# Patient Record
Sex: Female | Born: 1945 | Race: White | Hispanic: No | Marital: Married | Smoking: Never smoker
Health system: Southern US, Community
[De-identification: ages and names within clinical notes are randomized; demographics above are authoritative.]

## PROBLEM LIST (undated history)

## (undated) DIAGNOSIS — L309 Dermatitis, unspecified: Secondary | ICD-10-CM

## (undated) DIAGNOSIS — F419 Anxiety disorder, unspecified: Secondary | ICD-10-CM

## (undated) DIAGNOSIS — F329 Major depressive disorder, single episode, unspecified: Secondary | ICD-10-CM

## (undated) DIAGNOSIS — K635 Polyp of colon: Secondary | ICD-10-CM

## (undated) DIAGNOSIS — K219 Gastro-esophageal reflux disease without esophagitis: Secondary | ICD-10-CM

## (undated) DIAGNOSIS — F32A Depression, unspecified: Secondary | ICD-10-CM

## (undated) DIAGNOSIS — T7840XA Allergy, unspecified, initial encounter: Secondary | ICD-10-CM

## (undated) DIAGNOSIS — L409 Psoriasis, unspecified: Secondary | ICD-10-CM

## (undated) DIAGNOSIS — C801 Malignant (primary) neoplasm, unspecified: Secondary | ICD-10-CM

## (undated) DIAGNOSIS — N200 Calculus of kidney: Secondary | ICD-10-CM

## (undated) DIAGNOSIS — G4730 Sleep apnea, unspecified: Secondary | ICD-10-CM

## (undated) DIAGNOSIS — R251 Tremor, unspecified: Secondary | ICD-10-CM

## (undated) HISTORY — DX: Malignant (primary) neoplasm, unspecified: C80.1

## (undated) HISTORY — DX: Gastro-esophageal reflux disease without esophagitis: K21.9

## (undated) HISTORY — DX: Polyp of colon: K63.5

## (undated) HISTORY — DX: Tremor, unspecified: R25.1

## (undated) HISTORY — DX: Depression, unspecified: F32.A

## (undated) HISTORY — DX: Calculus of kidney: N20.0

## (undated) HISTORY — DX: Anxiety disorder, unspecified: F41.9

## (undated) HISTORY — DX: Psoriasis, unspecified: L40.9

## (undated) HISTORY — DX: Allergy, unspecified, initial encounter: T78.40XA

## (undated) HISTORY — DX: Sleep apnea, unspecified: G47.30

## (undated) HISTORY — DX: Dermatitis, unspecified: L30.9

---
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
## 1998-09-06 HISTORY — PX: CHOLECYSTECTOMY: SHX55

## 2011-09-07 HISTORY — PX: GASTRIC BYPASS: SHX52

## 2012-09-06 HISTORY — PX: TOTAL KNEE ARTHROPLASTY: SHX125

## 2016-07-27 DIAGNOSIS — M75120 Complete rotator cuff tear or rupture of unspecified shoulder, not specified as traumatic: Secondary | ICD-10-CM | POA: Insufficient documentation

## 2019-04-24 ENCOUNTER — Telehealth: Payer: Self-pay | Admitting: Family Medicine

## 2019-04-24 ENCOUNTER — Ambulatory Visit (INDEPENDENT_AMBULATORY_CARE_PROVIDER_SITE_OTHER): Payer: Medicare Other | Admitting: Family Medicine

## 2019-04-24 ENCOUNTER — Other Ambulatory Visit: Payer: Self-pay | Admitting: Family Medicine

## 2019-04-24 ENCOUNTER — Other Ambulatory Visit: Payer: Self-pay

## 2019-04-24 ENCOUNTER — Encounter: Payer: Self-pay | Admitting: Family Medicine

## 2019-04-24 VITALS — BP 136/78 | HR 99 | Resp 16 | Ht 62.5 in | Wt 272.0 lb

## 2019-04-24 DIAGNOSIS — N200 Calculus of kidney: Secondary | ICD-10-CM | POA: Insufficient documentation

## 2019-04-24 DIAGNOSIS — F411 Generalized anxiety disorder: Secondary | ICD-10-CM | POA: Insufficient documentation

## 2019-04-24 DIAGNOSIS — G4733 Obstructive sleep apnea (adult) (pediatric): Secondary | ICD-10-CM

## 2019-04-24 DIAGNOSIS — Z7689 Persons encountering health services in other specified circumstances: Secondary | ICD-10-CM | POA: Diagnosis not present

## 2019-04-24 DIAGNOSIS — E782 Mixed hyperlipidemia: Secondary | ICD-10-CM | POA: Insufficient documentation

## 2019-04-24 DIAGNOSIS — Z6841 Body Mass Index (BMI) 40.0 and over, adult: Secondary | ICD-10-CM

## 2019-04-24 DIAGNOSIS — C441192 Basal cell carcinoma of skin of left lower eyelid, including canthus: Secondary | ICD-10-CM | POA: Diagnosis not present

## 2019-04-24 DIAGNOSIS — R7309 Other abnormal glucose: Secondary | ICD-10-CM

## 2019-04-24 DIAGNOSIS — R600 Localized edema: Secondary | ICD-10-CM

## 2019-04-24 DIAGNOSIS — Z9989 Dependence on other enabling machines and devices: Secondary | ICD-10-CM

## 2019-04-24 NOTE — Progress Notes (Signed)
Subjective:    Patient ID: Natasha Curry, female    DOB: [birth year], 73 y.o.   MRN: [id number]  Natasha Curry is a 73 y.o. female presenting on 04/24/2019 for [place], Obesity, Nephrolithiasis, and Hyperlipidemia  Moved from [place] about 5 months ago. Her husband, Natasha Curry, is also my patient here recently established.  HPI   Hypersomnia / Family history of Mood Disorder vs Anxiety She is sleeping often, and enjoys resting. She is not depressed, doing well overall with mood. She can take naps 1-2 hours in afternoon. She uses CPAP machine. She is very comfortable here and home more often therefore often will take more naps. - She has history of grief depression in past lost her best friend, she was treated at [facility] in [place], she eventually was taken off multiple psych meds, and transitioned to Ativan, not on this long term. Now she is taking Lexapro 20mg  daily with good results. - Admits some issues with some memory loss and forgetful - She says some family with history of mental health - Currently she is doing well.  Morbid Obesity BMI >48 / S/p gastric bypass She says history lost 100 lbs in past, and gained weight back Admits abnormal weight gain  HYPERLIPIDEMIA: - Reports no concerns. Last lipid panel >6 months ago unsure - Currently taking Atorvastatin 10mg  daily, tolerating well without side effects or myalgias  Peripheral Edema Recently started within past 1 year or less on Furosemide 20mg  daily, she has not had issues with blood pressure and only taking for fluid, she says only mild improvement on pill, asking if she should take it regularly  Recurrent Nephrolithiasis History of recurrent kidney stones, usually at least 1 per year, in past urology required lithotripsy for stone treatment, she request refer to local urologist now. Currently asymptomatic.  BCC Left Lower Eyelid S/p surgical treatment, she has issue with change in eyelid now requesting  return to eye doctor  Health Maintenance:  Colon CA Screening: Last Colonoscopy (done by out of state [place] [facility]), results with polyps, good for 5 years. Currently asymptomatic. No known family history of colon CA Request record now and consider repeat in 4 years   Depression screen PHQ 2/9 04/24/2019  Decreased Interest 1  Down, Depressed, Hopeless 0  PHQ - 2 Score 1  Altered sleeping 3  Tired, decreased energy 3  Change in appetite 3  Feeling bad or failure about yourself  0  Trouble concentrating 0  Moving slowly or fidgety/restless 0  Suicidal thoughts 0  PHQ-9 Score 10    Past Medical History:  Diagnosis Date  . Allergy   . Anxiety   . 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 Colon polyp   . Depression   . Eczema   . GERD (gastroesophageal reflux disease)   . Kidney stones   . Psoriasis   . Sleep apnea    Past Surgical History:  Procedure Laterality Date  . CHOLECYSTECTOMY  2000  . GASTRIC BYPASS  2013  . TOTAL KNEE ARTHROPLASTY  2014   Social History   Socioeconomic History  . Marital status: Married    Spouse name: Not on file  . Number of children: Not on file  . Years of education: Not on file  . Highest education level: Not on file  Occupational History  . Not on file  Social Needs  . Financial resource strain: Not on file  . Food insecurity    Worry: Not on  file    Inability: Not on file  . Transportation needs    Medical: Not on file    Non-medical: Not on file  Tobacco Use  . Smoking status: Never Smoker  . Smokeless tobacco: Never Used  Substance and Sexual Activity  . Alcohol use: Yes    Alcohol/week: 3.0 standard drinks    Types: 3 Standard drinks or equivalent per week    Frequency: Never  . Drug use: Never  . Sexual activity: Not on file  Lifestyle  . Physical activity    Days per week: Not on file    Minutes per session: Not on file  . Stress: Not on file  Relationships  . Social Herbalist on phone: Not on file    Gets  together: Not on file    Attends religious service: Not on file    Active member of club or organization: Not on file    Attends meetings of clubs or organizations: Not on file    Relationship status: Not on file  . Intimate partner violence    Fear of current or ex partner: Not on file    Emotionally abused: Not on file    Physically abused: Not on file    Forced sexual activity: Not on file  Other Topics Concern  . Not on file  Social History Narrative  . Not on file   Family History  Problem Relation Age of Onset  . Cancer Mother        adrenal  . Heart attack Father   . Heart disease Father   . Stroke Father    Current Outpatient Medications on File Prior to Visit  Medication Sig  . atorvastatin (LIPITOR) 10 MG tablet Take 10 mg by mouth daily.  Marland Kitchen escitalopram (LEXAPRO) 20 MG tablet Take 20 mg by mouth daily.  . furosemide (LASIX) 20 MG tablet Take 20 mg by mouth daily as needed.  . metoprolol succinate (TOPROL-XL) 25 MG 24 hr tablet Take 12.5 mg by mouth daily.  Marland Kitchen omeprazole (PRILOSEC) 40 MG capsule Take 40 mg by mouth daily.    No current facility-administered medications on file prior to visit.     Review of Systems Per HPI unless specifically indicated above      Objective:    BP 136/78   Pulse 99   Resp 16   Ht 5' 2.5" (1.588 m)   Wt 272 lb (123.4 kg)   SpO2 99%   BMI 48.96 kg/m   Wt Readings from Last 3 Encounters:  04/24/19 272 lb (123.4 kg)    Physical Exam Vitals signs and nursing note reviewed.  Constitutional:      General: She is not in acute distress.    Appearance: She is well-developed. She is not diaphoretic.     Comments: Well-appearing, comfortable, cooperative, obese  HENT:     Head: Normocephalic and atraumatic.  Eyes:     General:        Right eye: No discharge.        Left eye: No discharge.     Conjunctiva/sclera: Conjunctivae normal.  Cardiovascular:     Rate and Rhythm: Normal rate.  Pulmonary:     Effort: Pulmonary  effort is normal.  Skin:    General: Skin is warm and dry.     Findings: No erythema or rash.  Neurological:     Mental Status: She is alert and oriented to person, place, and time.  Psychiatric:  Behavior: Behavior normal.     Comments: Well groomed, good eye contact, normal speech and thoughts    No results found for this or any previous visit.    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 They can request records from her ophtho out of state    Relevant Orders   Ambulatory referral to Ophthalmology   OSA on CPAP - Primary  Well controlled, chronic OSA on CPAP, now >5 years - Good adherence to CPAP nightly - Continue current CPAP therapy, patient seems to be benefiting from therapy  Still with frequent sleeping in daytime but not having daytime sleepiness interfering with activities  Refer to [facility] by patient request for managing OSA    Relevant Orders   Ambulatory referral to Pulmonology   Recurrent nephrolithiasis Chronic recurrent issue, stable currently without acute flare  Referral for establish care w/ Urologist for future management of stones    Relevant Orders   Ambulatory referral to Urology    Other Visit Diagnoses    Encounter to establish care with new doctor        Request records release from prior PCP and specialist out of state including colonoscopy   #peripheral edema, b/l lower ext No significant problem or worsening Advised can reduce dose on Furosemide and use PRN ONLY for now, to see if still effective Keep on RICE therapy Future labs  No orders of the defined types were placed in this encounter.  Orders Placed This Encounter  Procedures  . Ambulatory referral to Pulmonology    Referral Priority:   Routine    Referral Type:    Consultation    Referral Reason:   Specialty Services Required    Requested Specialty:   Pulmonary Disease    Number of Visits Requested:   1  . Ambulatory referral to Urology    Referral Priority:   Routine    Referral Type:   Consultation    Referral Reason:   Specialty Services Required    Requested Specialty:   Urology    Number of Visits Requested:   1  . Ambulatory referral to Ophthalmology    Referral Priority:   Routine    Referral Type:   Consultation    Referral Reason:   Specialty Services Required    Requested Specialty:   Ophthalmology    Number of Visits Requested:   1      Follow up plan: Return in about 3 months (around 07/25/2019) for 3 months for Yearly Medicare Check up + EKG.  Future labs ordered 07/19/19  Nobie Putnam, [facility] 04/24/2019, 10:30 AM

## 2019-04-24 NOTE — Patient Instructions (Addendum)
Thank you for coming to the office today.  Let our office know by phone when you are running low on medications and we can re order to put my name on the rx  Referrals sent - stay tuned for apt  [facility] Pulmonology [address] [place], [place] Phone: [place] -1st floor [place],  [place]  [address] Phone: [phone]  [facility] [address] [place], [place] [address] Phone: [phone] [email or web address]  EKG in 3 months at yearly apt  DUE for FASTING BLOOD WORK (no food or drink after midnight before the lab appointment, only water or coffee without cream/sugar on the morning of)  SCHEDULE "Lab Only" visit in the morning at the clinic for lab draw in 3 MONTHS   - Make sure Lab Only appointment is at about 1 week before your next appointment, so that results will be available  For Lab Results, once available within 2-3 days of blood draw, you can can log in to MyChart online to view your results and a brief explanation. Also, we can discuss results at next follow-up visit.   Please schedule a Follow-up Appointment to: Return in about 3 months (around 07/25/2019) for 3 months for Yearly Medicare Check up + EKG.  If you have any other questions or concerns, please feel free to call the office or send a message through [place]. You may also schedule an earlier appointment if necessary.  Additionally, you may be receiving a survey about your experience at our office within a few days to 1 week by e-mail or mail. We value your feedback.  Nobie Putnam, DO [place]

## 2019-04-24 NOTE — Telephone Encounter (Signed)
[facility] called said that pt need to go to [facility]

## 2019-04-24 NOTE — Telephone Encounter (Signed)
Natasha Curry,  Would you be able to use the same external referral order and switch this to the patient's new requested location? She called [place] back today and said that [facility] called her and she needed to go to a [facility] provider.  Let me know if you need a new order.  Nobie Putnam, [facility] 04/24/2019, 4:58 PM

## 2019-04-26 ENCOUNTER — Other Ambulatory Visit: Payer: Self-pay | Admitting: Family Medicine

## 2019-04-26 DIAGNOSIS — N898 Other specified noninflammatory disorders of vagina: Secondary | ICD-10-CM

## 2019-04-27 ENCOUNTER — Other Ambulatory Visit: Payer: Self-pay | Admitting: Family Medicine

## 2019-04-27 DIAGNOSIS — F411 Generalized anxiety disorder: Secondary | ICD-10-CM

## 2019-04-27 MED ORDER — ESCITALOPRAM OXALATE 20 MG PO TABS: 20.0000 mg | ORAL_TABLET | Freq: Every day | ORAL | 1 refills | Status: DC

## 2019-04-27 NOTE — Telephone Encounter (Signed)
Pt.called requesting refill on escitalopram called into  [facility]

## 2019-05-03 ENCOUNTER — Encounter: Payer: Self-pay | Admitting: Obstetrics and Gynecology

## 2019-05-03 ENCOUNTER — Other Ambulatory Visit: Payer: Self-pay

## 2019-05-03 ENCOUNTER — Ambulatory Visit (INDEPENDENT_AMBULATORY_CARE_PROVIDER_SITE_OTHER): Payer: Medicare Other | Admitting: Obstetrics and Gynecology

## 2019-05-03 VITALS — BP 147/79 | HR 71 | Ht 63.0 in | Wt 278.3 lb

## 2019-05-03 DIAGNOSIS — N816 Rectocele: Secondary | ICD-10-CM

## 2019-05-03 DIAGNOSIS — N8189 Other female genital prolapse: Secondary | ICD-10-CM | POA: Diagnosis not present

## 2019-05-03 NOTE — Progress Notes (Signed)
HPI:      Natasha Curry is a 73 y.o. No obstetric history on file. who LMP was No LMP recorded. Patient has had a hysterectomy.  Subjective:   She presents today with complaint of something bulging out of the vagina.  She thinks maybe her "bladder fell".  She reports no urinary incontinence.  She reports no problems with bowel movements with the exception of uncontrollable flatulence due to a sphincter weakness.  She did have physical therapy for this and says that she no longer passes stool but she cannot hold gas.  She also reports occasional low back pain and thinks it has to do with her prolapse. She reports no difficulty with bowel movements.  No urine loss. She is not sexually active and has no future plans for sexual activity. She previously had a hysterectomy.    Hx: The following portions of the patient's history were reviewed and updated as appropriate:             She  has a past medical history of Allergy, Anxiety, Cancer ([place]), Colon polyp, Depression, Eczema, GERD (gastroesophageal reflux disease), Kidney stones, Psoriasis, and Sleep apnea. She does not have any pertinent problems on file. She  has a past surgical history that includes Total knee arthroplasty (2014); Gastric bypass (2013); and Cholecystectomy (2000). Her family history includes Cancer in her mother; Heart attack in her father; Heart disease in her father; Stroke in her father. She  reports that she has never smoked. She has never used smokeless tobacco. She reports current alcohol use of about 3.0 standard drinks of alcohol per week. She reports that she does not use drugs. She has a current medication list which includes the following prescription(s): atorvastatin, escitalopram, metoprolol succinate, omeprazole, and furosemide. She has No Known Allergies.       Review of Systems:  Review of Systems  Constitutional: Denied constitutional symptoms, night sweats, recent illness, fatigue, fever, insomnia and  weight loss.  Eyes: Denied eye symptoms, eye pain, photophobia, vision change and visual disturbance.  Ears/Nose/Throat/Neck: Denied ear, nose, throat or neck symptoms, hearing loss, nasal discharge, sinus congestion and sore throat.  Cardiovascular: Denied cardiovascular symptoms, arrhythmia, chest pain/pressure, edema, exercise intolerance, orthopnea and palpitations.  Respiratory: Denied pulmonary symptoms, asthma, pleuritic pain, productive sputum, cough, dyspnea and wheezing.  Gastrointestinal: Denied, gastro-esophageal reflux, melena, nausea and vomiting.  Genitourinary: See HPI for additional information.  Musculoskeletal: Denied musculoskeletal symptoms, stiffness, swelling, muscle weakness and myalgia.  Dermatologic: Denied dermatology symptoms, rash and scar.  Neurologic: Denied neurology symptoms, dizziness, headache, neck pain and syncope.  Psychiatric: Denied psychiatric symptoms, anxiety and depression.  Endocrine: Denied endocrine symptoms including hot flashes and night sweats.   Meds:   Current Outpatient Medications on File Prior to Visit  Medication Sig Dispense Refill  . atorvastatin (LIPITOR) 10 MG tablet Take 10 mg by mouth daily.    Marland Kitchen escitalopram (LEXAPRO) 20 MG tablet Take 1 tablet (20 mg total) by mouth daily. 90 tablet 1  . metoprolol succinate (TOPROL-XL) 25 MG 24 hr tablet Take 12.5 mg by mouth daily.    Marland Kitchen omeprazole (PRILOSEC) 40 MG capsule Take 40 mg by mouth daily.     . furosemide (LASIX) 20 MG tablet Take 20 mg by mouth daily as needed.     No current facility-administered medications on file prior to visit.     Objective:     Vitals:   05/03/19 0856  BP: (!) 147/79  Pulse: 71  Physical examination   Pelvic:   Vulva: Normal appearance.  No lesions.  Vagina: No lesions or abnormalities noted.  Support:  Pelvic relaxation third-degree rectocele second-degree cystocele some vaginal cuff relaxation/prolapse.  Possible enterocele.   Urethra No masses tenderness or scarring.  Meatus Normal size without lesions or prolapse.  Cervix:  Surgically absent  Anus: Normal exam.  No lesions.  Perineum: Normal exam.  No lesions.        Bimanual   Uterus:  Surgically absent  Adnexae: No masses.  Non-tender to palpation.  Cul-de-sac: Negative for abnormality.     Assessment:    No obstetric history on file. Patient Active Problem List   Diagnosis Date Noted  . 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 OSA on CPAP 04/24/2019  . Bilateral lower extremity edema 04/24/2019  . Morbid obesity with BMI of 45.0-49.9, adult ([place]) 04/24/2019  . Mixed hyperlipidemia 04/24/2019  . GAD (generalized anxiety disorder) 04/24/2019     1. Pelvic relaxation disorder   2. Rectocele     Patient is symptomatic with the above.   Plan:            1.  We have discussed cystocele rectocele and pelvic prolapse in detail.  Pictures drawn and literature given.  Expectant management versus pessary versus surgical treatment discussed in detail.  Complicating surgical factors discussed which include chronic coughing obesity heavy lifting etc. All questions answered.  Patient strongly considering surgery in the near future. Orders No orders of the defined types were placed in this encounter.   No orders of the defined types were placed in this encounter.     F/U  No follow-ups on file. I spent 35 minutes involved in the care of this patient of which greater than 50% was spent discussing pelvic prolapse, rectocele cystocele vaginal cuff prolapse, surgical repair versus pessary versus expectant management.  Management of stool and bladder function discussed in detail.  All questions answered.  Finis Bud, M.D. 05/03/2019 9:51 AM

## 2019-05-03 NOTE — Progress Notes (Signed)
Patient comes in today as a new patient. She stated that she is not sure if she has prolapsed bladder or a cyst.

## 2019-06-25 ENCOUNTER — Telehealth: Payer: Self-pay | Admitting: Family Medicine

## 2019-06-25 DIAGNOSIS — Z96659 Presence of unspecified artificial knee joint: Secondary | ICD-10-CM

## 2019-06-25 NOTE — Telephone Encounter (Signed)
We received a fax on Sunday 10/18 that requested an Amox[facility] rx (new rx[facility]/19.  I did not realize this request was sent until now after hours approx 6pm.  Could you contact patient to see if she still needs Amoxicillin rx?  Nobie Putnam, DO [facility] 06/25/2019, 6:18 PM

## 2019-06-26 MED ORDER — AMOXICILLIN 500 MG PO CAPS: 1000.0000 mg | ORAL_CAPSULE | Freq: Two times a day (BID) | ORAL | 0 refills | Status: DC

## 2019-06-26 NOTE — Telephone Encounter (Signed)
[facility]

## 2019-06-26 NOTE — Telephone Encounter (Signed)
Patient has received Amoxicillin previously. Will agree to fill now at patient's repeat request and discuss with patient in future about benefits risks of antibiotic prophylaxis w/ dental work as guidelines have changed, would be optional for them in future.  Nobie Putnam, DO [facility] 06/26/2019, 9:07 AM

## 2019-07-19 ENCOUNTER — Other Ambulatory Visit: Payer: Medicare Other

## 2019-07-19 ENCOUNTER — Other Ambulatory Visit: Payer: Self-pay

## 2019-07-19 DIAGNOSIS — F411 Generalized anxiety disorder: Secondary | ICD-10-CM

## 2019-07-19 DIAGNOSIS — R7309 Other abnormal glucose: Secondary | ICD-10-CM

## 2019-07-19 DIAGNOSIS — Z6841 Body Mass Index (BMI) 40.0 and over, adult: Secondary | ICD-10-CM

## 2019-07-19 DIAGNOSIS — E782 Mixed hyperlipidemia: Secondary | ICD-10-CM

## 2019-07-19 DIAGNOSIS — G4733 Obstructive sleep apnea (adult) (pediatric): Secondary | ICD-10-CM

## 2019-07-20 LAB — COMPLETE METABOLIC PANEL WITH GFR
AG Ratio: 1.6 (calc) (ref 1.0–2.5)
ALT: 11 U/L (ref 6–29)
AST: 11 U/L (ref 10–35)
Albumin: 3.8 g/dL (ref 3.6–5.1)
Alkaline phosphatase (APISO): 110 U/L (ref 37–153)
BUN: 17 mg/dL (ref 7–25)
CO2: 25 mmol/L (ref 20–32)
Calcium: 9 mg/dL (ref 8.6–10.4)
Chloride: 107 mmol/L (ref 98–110)
Creat: 0.88 mg/dL (ref 0.60–0.93)
GFR, Est African American: 75 mL/min/{1.73_m2} (ref >=60)
GFR, Est Non African American: 65 mL/min/{1.73_m2} (ref >=60)
Globulin: 2.4 g/dL (calc) (ref 1.9–3.7)
Glucose, Bld: 133 mg/dL — ABNORMAL HIGH (ref 65–99)
Potassium: 4.3 mmol/L (ref 3.5–5.3)
Sodium: 140 mmol/L (ref 135–146)
Total Bilirubin: 0.6 mg/dL (ref 0.2–1.2)
Total Protein: 6.2 g/dL (ref 6.1–8.1)

## 2019-07-20 LAB — CBC WITH DIFFERENTIAL/PLATELET
Absolute Monocytes: 446 cells/uL (ref 200–950)
Basophils Absolute: 50 cells/uL (ref 0–200)
Basophils Relative: 0.9 %
Eosinophils Absolute: 121 cells/uL (ref 15–500)
Eosinophils Relative: 2.2 %
HCT: 40 % (ref 35.0–45.0)
Hemoglobin: 13 g/dL (ref 11.7–15.5)
Lymphs Abs: 2167 cells/uL (ref 850–3900)
MCH: 29 pg (ref 27.0–33.0)
MCHC: 32.5 g/dL (ref 32.0–36.0)
MCV: 89.3 fL (ref 80.0–100.0)
MPV: 10.9 fL (ref 7.5–12.5)
Monocytes Relative: 8.1 %
Neutro Abs: 2717 cells/uL (ref 1500–7800)
Neutrophils Relative %: 49.4 %
Platelets: 222 10*3/uL (ref 140–400)
RBC: 4.48 10*6/uL (ref 3.80–5.10)
RDW: 13.2 % (ref 11.0–15.0)
Total Lymphocyte: 39.4 %
WBC: 5.5 10*3/uL (ref 3.8–10.8)

## 2019-07-20 LAB — HEMOGLOBIN A1C
Hgb A1c MFr Bld: 6.8 % of total Hgb — ABNORMAL HIGH (ref <5.7)
Mean Plasma Glucose: 148 (calc)
eAG (mmol/L): 8.2 (calc)

## 2019-07-20 LAB — LIPID PANEL
Cholesterol: 167 mg/dL (ref <200)
HDL: 31 mg/dL — ABNORMAL LOW (ref >=50)
LDL Cholesterol (Calc): 105 mg/dL (calc) — ABNORMAL HIGH
Non-HDL Cholesterol (Calc): 136 mg/dL (calc) — ABNORMAL HIGH (ref <130)
Total CHOL/HDL Ratio: 5.4 (calc) — ABNORMAL HIGH (ref <5.0)
Triglycerides: 185 mg/dL — ABNORMAL HIGH (ref <150)

## 2019-07-20 LAB — TSH: TSH: 2.16 mIU/L (ref 0.40–4.50)

## 2019-07-25 ENCOUNTER — Ambulatory Visit: Payer: Medicare Other | Admitting: Family Medicine

## 2019-07-26 ENCOUNTER — Ambulatory Visit (INDEPENDENT_AMBULATORY_CARE_PROVIDER_SITE_OTHER): Payer: Medicare Other | Admitting: Family Medicine

## 2019-07-26 ENCOUNTER — Encounter: Payer: Self-pay | Admitting: Family Medicine

## 2019-07-26 ENCOUNTER — Other Ambulatory Visit: Payer: Self-pay

## 2019-07-26 VITALS — BP 165/78 | HR 68 | Ht 63.0 in | Wt 280.2 lb

## 2019-07-26 DIAGNOSIS — Z6841 Body Mass Index (BMI) 40.0 and over, adult: Secondary | ICD-10-CM

## 2019-07-26 DIAGNOSIS — R7309 Other abnormal glucose: Secondary | ICD-10-CM | POA: Insufficient documentation

## 2019-07-26 DIAGNOSIS — R600 Localized edema: Secondary | ICD-10-CM | POA: Diagnosis not present

## 2019-07-26 DIAGNOSIS — R0609 Other forms of dyspnea: Secondary | ICD-10-CM

## 2019-07-26 DIAGNOSIS — G4733 Obstructive sleep apnea (adult) (pediatric): Secondary | ICD-10-CM | POA: Diagnosis not present

## 2019-07-26 DIAGNOSIS — R0600 Dyspnea, unspecified: Secondary | ICD-10-CM

## 2019-07-26 DIAGNOSIS — Z9989 Dependence on other enabling machines and devices: Secondary | ICD-10-CM | POA: Diagnosis not present

## 2019-07-26 DIAGNOSIS — E1169 Type 2 diabetes mellitus with other specified complication: Secondary | ICD-10-CM | POA: Insufficient documentation

## 2019-07-26 NOTE — Patient Instructions (Addendum)
Thank you for coming to the office today.  Keep up the plan to improve exercise and reduce carb and starch intake.  Remain off fluid pill  Referral to lung doctors  [facility] Pulmonology [address] [place], [place] Phone: [phone]   Please schedule a Follow-up Appointment to: Return in about 4 months (around 11/23/2019) for 4 months for PreDM A1c, Weight Check, pulm follow-up.  If you have any other questions or concerns, please feel free to call the office or send a message through [place]. You may also schedule an earlier appointment if necessary.  Additionally, you may be receiving a survey about your experience at our office within a few days to 1 week by e-mail or mail. We value your feedback.  Nobie Putnam, DO [place]

## 2019-07-26 NOTE — Progress Notes (Signed)
Subjective:    Patient ID: Natasha Curry, female    DOB: 10-01-1945, 73 y.o.   MRN: [id number]  Natasha Curry is a 73 y.o. female presenting on 07/26/2019 for Obesity   HPI   OSA, on CPAP - Patient reports prior history of dx OSA and on CPAP for years, [place] - Today reports that sleep apnea is well controlled. She uses the CPAP machine every night. Tolerates the machine well, and thinks that sleeps better with it and feels good. No new concerns or symptoms. - Needs referral to Pulm to further follow up her OSA  Elevated A1c  Morbid Obesity BMI >49 Result A1c up to 6.8, no prior comparison Weight 7 labs gain since last visit Admits some dyspnea as well during day with exertion, attributes this to her weight Increased eating, constantly, bored, sedentary, goal to walk again more   Elevated BP w/o HTN History of gastric bypass   Health Maintenance:  UTD [place] out of state.  Depression screen [facility] 2/9 04/24/2019  Decreased Interest 1  Down, Depressed, Hopeless 0  PHQ - 2 Score 1  Altered sleeping 3  Tired, decreased energy 3  Change in appetite 3  Feeling bad or failure about yourself  0  Trouble concentrating 0  Moving slowly or fidgety/restless 0  Suicidal thoughts 0  PHQ-9 Score 10   No flowsheet data found.    Past Medical History:  Diagnosis Date  . Allergy   . Anxiety   . 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 Colon polyp   . Depression   . Eczema   . GERD (gastroesophageal reflux disease)   . Kidney stones   . Psoriasis   . Sleep apnea    Past Surgical History:  Procedure Laterality Date  . CHOLECYSTECTOMY  2000  . GASTRIC BYPASS  2013  . TOTAL KNEE ARTHROPLASTY  2014   Social History   Socioeconomic History  . Marital status: Married    Spouse name: Not on file  . Number of children: Not on file  . Years of education: Not on file  . Highest education level: Not on file  Occupational History  . Not on file  Social Needs   . Financial resource strain: Not on file  . Food insecurity    Worry: Not on file    Inability: Not on file  . Transportation needs    Medical: Not on file    Non-medical: Not on file  Tobacco Use  . Smoking status: Never Smoker  . Smokeless tobacco: Never Used  Substance and Sexual Activity  . Alcohol use: Yes    Alcohol/week: 3.0 standard drinks    Types: 3 Standard drinks or equivalent per week    Frequency: Never  . Drug use: Never  . Sexual activity: Not on file  Lifestyle  . Physical activity    Days per week: Not on file    Minutes per session: Not on file  . Stress: Not on file  Relationships  . Social Herbalist on phone: Not on file    Gets together: Not on file    Attends religious service: Not on file    Active member of club or organization: Not on file    Attends meetings of clubs or organizations: Not on file    Relationship status: Not on file  . Intimate partner violence    Fear of current or ex partner: Not on  file    Emotionally abused: Not on file    Physically abused: Not on file    Forced sexual activity: Not on file  Other Topics Concern  . Not on file  Social History Narrative  . Not on file   Family History  Problem Relation Age of Onset  . Cancer Mother        adrenal  . Heart attack Father   . Heart disease Father   . Stroke Father    Current Outpatient Medications on File Prior to Visit  Medication Sig  . amoxicillin (AMOXIL) 500 MG capsule Take 2 capsules (1,000 mg total) by mouth 2 (two) times daily. For 1 day, due to dental work  . atorvastatin (LIPITOR) 10 MG tablet Take 10 mg by mouth daily.  Marland Kitchen escitalopram (LEXAPRO) 20 MG tablet Take 1 tablet (20 mg total) by mouth daily.  . metoprolol succinate (TOPROL-XL) 25 MG 24 hr tablet Take 12.5 mg by mouth daily.  Marland Kitchen omeprazole (PRILOSEC) 40 MG capsule Take 40 mg by mouth daily.    No current facility-administered medications on file prior to visit.     Review of Systems  Per HPI unless specifically indicated above      Objective:    BP (!) 165/78 (BP Location: Left Arm, Patient Position: Sitting, Cuff Size: Large)   Pulse 68   Ht 5\' 3"  (1.6 m)   Wt 280 lb 3.2 oz (127.1 kg)   BMI 49.64 kg/m   Wt Readings from Last 3 Encounters:  07/26/19 280 lb 3.2 oz (127.1 kg)  05/03/19 278 lb 4.8 oz (126.2 kg)  04/24/19 272 lb (123.4 kg)    Physical Exam Vitals signs and nursing note reviewed.  Constitutional:      General: She is not in acute distress.    Appearance: She is well-developed. She is not diaphoretic.     Comments: Well-appearing, comfortable, cooperative, obese  HENT:     Head: Normocephalic and atraumatic.  Eyes:     General:        Right eye: No discharge.        Left eye: No discharge.     Conjunctiva/sclera: Conjunctivae normal.     Pupils: Pupils are equal, round, and reactive to light.  Neck:     Musculoskeletal: Normal range of motion and neck supple.     Thyroid: No thyromegaly.  Cardiovascular:     Rate and Rhythm: Normal rate and regular rhythm.     Heart sounds: Normal heart sounds. No murmur.  Pulmonary:     Effort: Pulmonary effort is normal. No respiratory distress.     Breath sounds: Normal breath sounds. No wheezing or rales.  Abdominal:     General: Bowel sounds are normal. There is no distension.     Palpations: Abdomen is soft. There is no mass.     Tenderness: There is no abdominal tenderness.  Musculoskeletal: Normal range of motion.        General: No tenderness.     Comments: Upper / Lower Extremities: - Normal muscle tone, strength bilateral upper extremities 5/5, lower extremities 5/5  Lymphadenopathy:     Cervical: No cervical adenopathy.  Skin:    General: Skin is warm and dry.     Findings: No erythema or rash.  Neurological:     Mental Status: She is alert and oriented to person, place, and time.     Comments: Distal sensation intact to light touch all extremities  Psychiatric:  Behavior:  Behavior normal.     Comments: Well groomed, good eye contact, normal speech and thoughts     EKG performed today for screening purposes and also with history of dyspnea. See scan for results. Unremarkable. No comparison.   Results for orders placed or performed in visit on 07/19/19  TSH  Result Value Ref Range   TSH 2.16 0.40 - 4.50 mIU/L  Lipid panel  Result Value Ref Range   Cholesterol 167 <200 mg/dL   HDL 31 (L) > OR = 50 mg/dL   Triglycerides 185 (H) <150 mg/dL   LDL Cholesterol (Calc) 105 (H) mg/dL (calc)   Total CHOL/HDL Ratio 5.4 (H) <5.0 (calc)   Non-HDL Cholesterol (Calc) 136 (H) <130 mg/dL (calc)  COMPLETE METABOLIC PANEL WITH GFR  Result Value Ref Range   Glucose, Bld 133 (H) 65 - 99 mg/dL   BUN 17 7 - 25 mg/dL   Creat 0.88 0.60 - 0.93 mg/dL   GFR, Est Non African American 65 > OR = 60 mL/min/1.22m2   GFR, Est African American 75 > OR = 60 mL/min/1.5m2   BUN/Creatinine Ratio NOT APPLICABLE 6 - 22 (calc)   Sodium 140 135 - 146 mmol/L   Potassium 4.3 3.5 - 5.3 mmol/L   Chloride 107 98 - 110 mmol/L   CO2 25 20 - 32 mmol/L   Calcium 9.0 8.6 - 10.4 mg/dL   Total Protein 6.2 6.1 - 8.1 g/dL   Albumin 3.8 3.6 - 5.1 g/dL   Globulin 2.4 1.9 - 3.7 g/dL (calc)   AG Ratio 1.6 1.0 - 2.5 (calc)   Total Bilirubin 0.6 0.2 - 1.2 mg/dL   Alkaline phosphatase (APISO) 110 37 - 153 U/L   AST 11 10 - 35 U/L   ALT 11 6 - 29 U/L  CBC with Differential/Platelet  Result Value Ref Range   WBC 5.5 3.8 - 10.8 Thousand/uL   RBC 4.48 3.80 - 5.10 Million/uL   Hemoglobin 13.0 11.7 - 15.5 g/dL   HCT 40.0 35.0 - 45.0 %   MCV 89.3 80.0 - 100.0 fL   MCH 29.0 27.0 - 33.0 pg   MCHC 32.5 32.0 - 36.0 g/dL   RDW 13.2 11.0 - 15.0 %   Platelets 222 140 - 400 Thousand/uL   MPV 10.9 7.5 - 12.5 fL   Neutro Abs 2,717 1,500 - 7,800 cells/uL   Lymphs Abs 2,167 850 - 3,900 cells/uL   Absolute Monocytes 446 200 - 950 cells/uL   Eosinophils Absolute 121 15 - 500 cells/uL   Basophils Absolute 50 0 -  200 cells/uL   Neutrophils Relative % 49.4 %   Total Lymphocyte 39.4 %   Monocytes Relative 8.1 %   Eosinophils Relative 2.2 %   Basophils Relative 0.9 %  Hemoglobin A1c  Result Value Ref Range   Hgb A1c MFr Bld 6.8 (H) <5.7 % of total Hgb   Mean Plasma Glucose 148 (calc)   eAG (mmol/L) 8.2 (calc)      Assessment & Plan:   Problem List Items Addressed This Visit    OSA on CPAP    Well controlled, chronic OSA on CPAP - Good adherence to CPAP nightly - Continue current CPAP therapy, patient seems to be benefiting from therapy  Referral to [facility] for CPAP assistance      Relevant Orders   Ambulatory referral to Pulmonology   Morbid obesity with BMI of 45.0-49.9, adult ([place])    Abnormal wt gain Encourage lifestyle and diet exercise  for wt loss goal  Future consider GLP1 medication      Elevated hemoglobin A1c - Primary    Concern elevated A1c to 6.8 now, no comparison At risk of T2DM Morbid Obesity  Plan Discuss diagnosis of T2DM and risk, proceed with aggressive diet / lifestyle intervention, handout low carb given, resume exercise, defer medication at this time, will repeat A1c within 4 months for monitoring and if >6.5 then new diagnosis T2DM       Bilateral lower extremity edema    Other Visit Diagnoses    Dyspnea on exertion       Relevant Orders   EKG 12-Lead   Ambulatory referral to Pulmonology    Clinically stable, unremarkable exam Possible Obesity Hypoventilation syndrome component Given OSA and recent dyspnea will request referral to Pulm   No orders of the defined types were placed in this encounter.    Follow up plan: Return in about 4 months (around 11/23/2019) for 4 months for PreDM A1c, Weight Check, pulm follow-up.  Nobie Putnam, [facility] 07/26/2019, 11:04 AM

## 2019-07-27 NOTE — Assessment & Plan Note (Signed)
Well controlled, chronic OSA on CPAP - Good adherence to CPAP nightly - Continue current CPAP therapy, patient seems to be benefiting from therapy  Referral to [facility] for CPAP assistance

## 2019-07-27 NOTE — Assessment & Plan Note (Addendum)
Abnormal wt gain Encourage lifestyle and diet exercise for wt loss goal  Future consider GLP1 medication

## 2019-07-27 NOTE — Assessment & Plan Note (Signed)
Concern elevated A1c to 6.8 now, no comparison At risk of T2DM Morbid Obesity  Plan Discuss diagnosis of T2DM and risk, proceed with aggressive diet / lifestyle intervention, handout low carb given, resume exercise, defer medication at this time, will repeat A1c within 4 months for monitoring and if >6.5 then new diagnosis T2DM

## 2019-09-25 ENCOUNTER — Encounter: Payer: Self-pay | Admitting: Family Medicine

## 2019-09-25 ENCOUNTER — Ambulatory Visit (INDEPENDENT_AMBULATORY_CARE_PROVIDER_SITE_OTHER): Payer: Medicare Other | Admitting: Family Medicine

## 2019-09-25 ENCOUNTER — Other Ambulatory Visit: Payer: Self-pay

## 2019-09-25 VITALS — BP 154/85 | HR 77

## 2019-09-25 DIAGNOSIS — I10 Essential (primary) hypertension: Secondary | ICD-10-CM

## 2019-09-25 DIAGNOSIS — H6690 Otitis media, unspecified, unspecified ear: Secondary | ICD-10-CM | POA: Diagnosis not present

## 2019-09-25 NOTE — Progress Notes (Signed)
Virtual Visit via Telephone The purpose of this virtual visit is to provide medical care while limiting exposure to the novel coronavirus (COVID19) for both patient and office staff.  Consent was obtained for phone visit:  Yes.   Answered questions that patient had about telehealth interaction:  Yes.   I discussed the limitations, risks, security and privacy concerns of performing an evaluation and management service by telephone. I also discussed with the patient that there may be a patient responsible charge related to this service. The patient expressed understanding and agreed to proceed.  Patient Location: Home Provider Location: [facility])  ---------------------------------------------------------------------- Chief Complaint  Patient presents with  . Hypertension    The patient was recently seen at [facility] for bilateral ear pain. She was diagnose with a ear infection and put on abx. Her blood pressure at that visit was 192/92.  She complains today of a constant headache, mostly on the top of the head that radiates to the back of the head. She also complains of memory issues since the recent ear infection.     S: Reviewed CMA documentation. I have called patient and gathered additional HPI as follows:  Elevated BP / Ear Pain AOM infection / Neck Pain Reports that symptoms started 3 days ago with ear pain both sides, neck pain associated headache, and it caused her BP to elevate up to >190/90. She was treated by [facility] for bilateral ear pain, with Cefdinir 300mg  BID antibiotic with notable improvement now with ear pain, now only one side feels better, has 4-5 more days of antibiotic left. - BP has improved with treatment. Now to 150-160 / 80s. Her HR 70-80s, she takes metoprolol XL 25mg  (HALF pill = 12.5mg  dose daily), she is asking about adjusting med. She already took pill today.  Denies any high risk travel to areas of current  concern for COVID19. Denies any known or suspected exposure to person with or possibly with COVID19.  Denies any fevers, chills, sweats, body ache, cough, shortness of breath, sinus pain or pressure, abdominal pain, diarrhea  Past Medical History:  Diagnosis Date  . Allergy   . Anxiety   . 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 Colon polyp   . Depression   . Eczema   . GERD (gastroesophageal reflux disease)   . Kidney stones   . Psoriasis   . Sleep apnea    Social History   Tobacco Use  . Smoking status: Never Smoker  . Smokeless tobacco: Never Used  Substance Use Topics  . Alcohol use: Yes    Alcohol/week: 3.0 standard drinks    Types: 3 Standard drinks or equivalent per week  . Drug use: Never    Current Outpatient Medications:  .  atorvastatin (LIPITOR) 10 MG tablet, Take 10 mg by mouth daily., Disp: , Rfl:  .  cefdinir (OMNICEF) 300 MG capsule, Take 300 mg by mouth 2 (two) times daily., Disp: , Rfl:  .  escitalopram (LEXAPRO) 20 MG tablet, Take 1 tablet (20 mg total) by mouth daily., Disp: 90 tablet, Rfl: 1 .  metoprolol succinate (TOPROL-XL) 25 MG 24 hr tablet, Take 12.5 mg by mouth daily., Disp: , Rfl:  .  omeprazole (PRILOSEC) 40 MG capsule, Take 40 mg by mouth daily. , Disp: , Rfl:   Depression screen PHQ 2/9 04/24/2019  Decreased Interest 1  Down, Depressed, Hopeless 0  PHQ - 2 Score 1  Altered sleeping 3  Tired,  decreased energy 3  Change in appetite 3  Feeling bad or failure about yourself  0  Trouble concentrating 0  Moving slowly or fidgety/restless 0  Suicidal thoughts 0  PHQ-9 Score 10    No flowsheet data found.  -------------------------------------------------------------------------- O: No physical exam performed due to remote telephone encounter.  Lab results reviewed.  Recent Results (from the past 2160 hour(s))  TSH     Status: None   Collection Time: 07/19/19  8:15 AM  Result Value Ref Range   TSH 2.16 0.40 - 4.50 mIU/L  Lipid panel      Status: Abnormal   Collection Time: 07/19/19  8:15 AM  Result Value Ref Range   Cholesterol 167 <200 mg/dL   HDL 31 (L) > OR = 50 mg/dL   Triglycerides 185 (H) <150 mg/dL   LDL Cholesterol (Calc) 105 (H) mg/dL (calc)    Comment: Reference range: <100 . Desirable range <100 mg/dL for primary prevention;   <70 mg/dL for patients with CHD or diabetic patients  with > or = 2 CHD risk factors. Marland Kitchen LDL-C is now calculated using the Martin-Hopkins  calculation, which is a validated novel method providing  better accuracy than the Friedewald equation in the  estimation of LDL-C.  Cresenciano Genre et al. Annamaria Helling. [id number]): 2061-2068  (http://education.[employer].com/faq/FAQ164)    Total CHOL/HDL Ratio 5.4 (H) <5.0 (calc)   Non-HDL Cholesterol (Calc) 136 (H) <130 mg/dL (calc)    Comment: For patients with diabetes plus 1 major ASCVD risk  factor, treating to a non-HDL-C goal of <100 mg/dL  (LDL-C of <70 mg/dL) is considered a therapeutic  option.   COMPLETE METABOLIC PANEL WITH GFR     Status: Abnormal   Collection Time: 07/19/19  8:15 AM  Result Value Ref Range   Glucose, Bld 133 (H) 65 - 99 mg/dL    Comment: .            Fasting reference interval . For someone without known diabetes, a glucose value >125 mg/dL indicates that they may have diabetes and this should be confirmed with a follow-up test. .    BUN 17 7 - 25 mg/dL   Creat 0.88 0.60 - 0.93 mg/dL    Comment: For patients >55 years of age, the reference limit for Creatinine is approximately 13% higher for people identified as African-American. .    GFR, Est Non African American 65 > OR = 60 mL/min/1.103m2   GFR, Est African American 75 > OR = 60 mL/min/1.33m2   BUN/Creatinine Ratio NOT APPLICABLE 6 - 22 (calc)   Sodium 140 135 - 146 mmol/L   Potassium 4.3 3.5 - 5.3 mmol/L   Chloride 107 98 - 110 mmol/L   CO2 25 20 - 32 mmol/L   Calcium 9.0 8.6 - 10.4 mg/dL   Total Protein 6.2 6.1 - 8.1 g/dL   Albumin 3.8 3.6 - 5.1  g/dL   Globulin 2.4 1.9 - 3.7 g/dL (calc)   AG Ratio 1.6 1.0 - 2.5 (calc)   Total Bilirubin 0.6 0.2 - 1.2 mg/dL   Alkaline phosphatase (APISO) 110 37 - 153 U/L   AST 11 10 - 35 U/L   ALT 11 6 - 29 U/L  CBC with Differential/Platelet     Status: None   Collection Time: 07/19/19  8:15 AM  Result Value Ref Range   WBC 5.5 3.8 - 10.8 Thousand/uL   RBC 4.48 3.80 - 5.10 Million/uL   Hemoglobin 13.0 11.7 - 15.5 g/dL   HCT  40.0 35.0 - 45.0 %   MCV 89.3 80.0 - 100.0 fL   MCH 29.0 27.0 - 33.0 pg   MCHC 32.5 32.0 - 36.0 g/dL   RDW 13.2 11.0 - 15.0 %   Platelets 222 140 - 400 Thousand/uL   MPV 10.9 7.5 - 12.5 fL   Neutro Abs 2,717 1,500 - 7,800 cells/uL   Lymphs Abs 2,167 850 - 3,900 cells/uL   Absolute Monocytes 446 200 - 950 cells/uL   Eosinophils Absolute 121 15 - 500 cells/uL   Basophils Absolute 50 0 - 200 cells/uL   Neutrophils Relative % 49.4 %   Total Lymphocyte 39.4 %   Monocytes Relative 8.1 %   Eosinophils Relative 2.2 %   Basophils Relative 0.9 %  Hemoglobin A1c     Status: Abnormal   Collection Time: 07/19/19  8:15 AM  Result Value Ref Range   Hgb A1c MFr Bld 6.8 (H) <5.7 % of total Hgb    Comment: For someone without known diabetes, a hemoglobin A1c value of 6.5% or greater indicates that they may have  diabetes and this should be confirmed with a follow-up  test. . For someone with known diabetes, a value <7% indicates  that their diabetes is well controlled and a value  greater than or equal to 7% indicates suboptimal  control. A1c targets should be individualized based on  duration of diabetes, age, comorbid conditions, and  other considerations. . Currently, no consensus exists regarding use of hemoglobin A1c for diagnosis of diabetes for children. .    Mean Plasma Glucose 148 (calc)   eAG (mmol/L) 8.2 (calc)    -------------------------------------------------------------------------- A&P:  Problem List Items Addressed This Visit    None    Visit  Diagnoses    Acute otitis media, unspecified otitis media type    -  Primary IMPROVED Continue current cefdinir antibiotic as prescribed by urgent care for AOM recently Call us within 2-3 days by end of week if not improved or new changes, would offer Augmentin or other antibiotic change if worse or new concerns. Or extension of current antibiotic    Relevant Medications   cefdinir (OMNICEF) 300 MG capsule   Essential Hypertension    Secondary to AOM ear pain raised her BP Elevated readings On Metoprolol XL 12.5mg  daily (half tab)  Advised her to take WHOLE tab for 1 week approx while having secondary problem, if BP >160 then can return to regular dosing in future       No orders of the defined types were placed in this encounter.   Follow-up: - Return in 2 days to 1 week if not improved ear infection / HTN  Patient verbalizes understanding with the above medical recommendations including the limitation of remote medical advice.  Specific follow-up and call-back criteria were given for patient to follow-up or seek medical care more urgently if needed.   - Time spent in direct consultation with patient on phone: 7 minutes   Nobie Putnam, [facility] 09/25/2019, 11:55 AM

## 2019-09-25 NOTE — Patient Instructions (Addendum)
If BP is elevated >160 you can take Metoprolol XL 25mg  for WHOLE PILL instead of half, only for next 1 week or so while you are dealing with this ear pain infection issue.  If not improved ear pain headache infection by Arlice Colt this week, call or message [place] to notify us of scenario and we can consider adding additional antibiotic or changing the medicine.  Please schedule a Follow-up Appointment to: Return in about 2 days (around 09/27/2019), or if symptoms worsen or fail to improve, for ear infection / HTN.  If you have any other questions or concerns, please feel free to call the office or send a message through [place]. You may also schedule an earlier appointment if necessary.  Additionally, you may be receiving a survey about your experience at our office within a few days to 1 week by e-mail or mail. We value your feedback.  Nobie Putnam, DO [place]

## 2019-09-26 ENCOUNTER — Telehealth: Payer: Self-pay | Admitting: Family Medicine

## 2019-09-26 ENCOUNTER — Ambulatory Visit: Payer: Medicare Other | Admitting: Family Medicine

## 2019-09-26 DIAGNOSIS — H6690 Otitis media, unspecified, unspecified ear: Secondary | ICD-10-CM

## 2019-09-26 MED ORDER — AMOXICILLIN-POT CLAVULANATE 875-125 MG PO TABS: 1.0000 | ORAL_TABLET | Freq: Two times a day (BID) | ORAL | 0 refills | Status: DC

## 2019-09-26 NOTE — Telephone Encounter (Signed)
The pt called and scheduled an appt for today because her blood pressure is still elevated. She had a telehealth visit yesterday with Dr. Parks Ranger concerning her blood pressure. She reports that her blood pressure this morning was 185/96 and later it was 167/82. She also complains of stiff neck, bilateral earache and headache that she associates with her elevated blood pressure. She state that she increased her blood pressure medication as Dr. Parks Ranger recommended, but no improvement. She also concern that maybe the antibiotic that she was prescribed didn't take care of her ear infection.   I spoke with Dr. Raliegh Ip and he verbalize that he will send a different antibiotic to treat her ear infection. He also recommended that the patient monitor her symptoms and if they worsen or doesn't improve that she seek care at the Urgent Care.  She verbalize understanding, no questions or concerns.

## 2019-09-26 NOTE — Telephone Encounter (Signed)
See triage note and office visit note from yesterday virtual visit.  Limited new recommendations in past 24 hours. Except agree to switch antibiotic to Augmentin BID x 10 days, new rx sent today.  Her apt was cancelled today here.  She was asked to monitor BP and if still not improving, she can seek care more immediately at Urgent Care vs ED if worsening, concern with headache / BP may need other evaluation and intervention.  Perhaps could add new BP medication such as Amlodipine in future if indicated.  Nobie Putnam, [facility] 09/26/2019, 3:03 PM

## 2019-10-05 ENCOUNTER — Other Ambulatory Visit: Payer: Self-pay

## 2019-10-05 ENCOUNTER — Telehealth: Payer: Self-pay | Admitting: Family Medicine

## 2019-10-05 DIAGNOSIS — I10 Essential (primary) hypertension: Secondary | ICD-10-CM

## 2019-10-05 MED ORDER — METOPROLOL SUCCINATE ER 25 MG PO TB24: 12.5000 mg | ORAL_TABLET | Freq: Every day | ORAL | 1 refills | Status: DC

## 2019-10-05 NOTE — Telephone Encounter (Signed)
Pt called requesting refill on metoprolol succinate called into [facility]  Phillip Heal

## 2019-10-16 ENCOUNTER — Ambulatory Visit (INDEPENDENT_AMBULATORY_CARE_PROVIDER_SITE_OTHER): Payer: Medicare Other | Admitting: Internal Medicine

## 2019-10-16 ENCOUNTER — Encounter: Payer: Self-pay | Admitting: Internal Medicine

## 2019-10-16 ENCOUNTER — Other Ambulatory Visit: Payer: Self-pay

## 2019-10-16 VITALS — BP 138/80 | HR 68 | Temp 97.0°F | Ht 63.0 in | Wt 281.6 lb

## 2019-10-16 DIAGNOSIS — G4719 Other hypersomnia: Secondary | ICD-10-CM | POA: Diagnosis not present

## 2019-10-16 NOTE — Progress Notes (Signed)
Name: Natasha Curry MRN: [id number] DOB: 27-Aug-1946     CONSULTATION DATE: 10/16/2019  REFERRING MD : Parks Ranger  CHIEF COMPLAINT: excessive daytime sleepiness    HISTORY OF PRESENT ILLNESS:  Patient is seen today for problems and issues with sleep related to excessive daytime sleepiness Patient  has been having sleep problems for many years Patient has been having excessive daytime sleepiness for a long time Patient has been having extreme fatigue and tiredness, lack of energy +  very Loud snoring every night + struggling breathe at night and gasps for air  Patient has been diagnosed with sleep apnea 10 years ago She uses nasal pillows Patient stays exhausted for many years Patient does have nightmares and vivid dreams She does not have morning headaches She has nonrefreshed sleep She has gained about 12 to 15 pounds over the last year  Patient has migrated from [place] to [facility] Has 3 children with grandkids No obvious environmental exposures She is a non-smoker She does drink wine occasionally  Discussed sleep data and reviewed with patient.  Encouraged proper weight management.  Discussed driving precautions and its relationship with hypersomnolence.  Discussed operating dangerous equipment and its relationship with hypersomnolence.  Discussed sleep hygiene, and benefits of a fixed sleep waked time.  The importance of getting eight or more hours of sleep discussed with patient.  Discussed limiting the use of the computer and television before bedtime.  Decrease naps during the day, so night time sleep will become enhanced.  Limit caffeine, and sleep deprivation.  HTN, stroke, and heart failure are potential risk factors.    EPWORTH SLEEP SCORE 3 Weighs 281 pounds  Nonsmoker Patient is looking to establish care for her sleep study and to see if her sleep apnea is well controlled  She normally goes to bed at 830 does not have a hard time  falling asleep She wakes up at 6:30 AM   PAST MEDICAL HISTORY :   has a past medical history of Allergy, Anxiety, Cancer ([place]), Colon polyp, Depression, Eczema, GERD (gastroesophageal reflux disease), Kidney stones, Psoriasis, and Sleep apnea.  has a past surgical history that includes Total knee arthroplasty (2014); Gastric bypass (2013); and Cholecystectomy (2000). Prior to Admission medications   Medication Sig Start Date End Date Taking? Authorizing Provider  atorvastatin (LIPITOR) 10 MG tablet Take 10 mg by mouth daily.   Yes [provider]  escitalopram (LEXAPRO) 20 MG tablet Take 1 tablet (20 mg total) by mouth daily. 04/27/19  Yes Karamalegos, Devonne Doughty, DO  metoprolol succinate (TOPROL-XL) 25 MG 24 hr tablet Take 0.5 tablets (12.5 mg total) by mouth daily. 10/05/19  Yes Karamalegos, Devonne Doughty, DO  omeprazole (PRILOSEC) 40 MG capsule Take 40 mg by mouth daily.  04/05/19  Yes [provider]   No Known Allergies  FAMILY HISTORY:  family history includes Cancer in her mother; Heart attack in her father; Heart disease in her father; Stroke in her father. SOCIAL HISTORY:  reports that she has never smoked. She has never used smokeless tobacco. She reports current alcohol use of about 3.0 standard drinks of alcohol per week. She reports that she does not use drugs.  REVIEW OF SYSTEMS:   Constitutional: Negative for fever, chills, weight loss, malaise/fatigue and diaphoresis.  HENT: Negative for hearing loss, ear pain, nosebleeds, congestion, sore throat, neck pain, tinnitus and ear discharge.   Eyes: Negative for blurred vision, double vision, photophobia, pain, discharge and redness.  Respiratory: Negative for cough, hemoptysis, sputum  production, shortness of breath, wheezing and stridor.   Cardiovascular: Negative for chest pain, palpitations, orthopnea, claudication, leg swelling and PND.  Gastrointestinal: Negative for heartburn, nausea, vomiting, abdominal  pain, diarrhea, constipation, blood in stool and melena.  Genitourinary: Negative for dysuria, urgency, frequency, hematuria and flank pain.  Musculoskeletal: Negative for myalgias, back pain, joint pain and falls.  Skin: Negative for itching and rash.  Neurological: Negative for dizziness, tingling, tremors, sensory change, speech change, focal weakness, seizures, loss of consciousness, weakness and headaches.  Endo/Heme/Allergies: Negative for environmental allergies and polydipsia. Does not bruise/bleed easily.  ALL OTHER ROS ARE NEGATIVE   BP 138/80 (BP Location: Left Arm, Patient Position: Sitting, Cuff Size: Large)   Pulse 68   Temp (!) 97 F (36.1 C) (Temporal)   Ht 5\' 3"  (1.6 m)   Wt 281 lb 9.6 oz (127.7 kg)   SpO2 95% Comment: on ra  BMI 49.88 kg/m    Physical Examination:   General Appearance: No distress  Neuro:without focal findings,  speech normal,  HEENT: PERRLA, EOM intact.   Pulmonary: normal breath sounds, No wheezing.  CardiovascularNormal S1,S2.  No m/r/g.   Abdomen: Benign, Soft, non-tender. Renal:  No costovertebral tenderness  GU:  Not performed at this time. Endoc: No evident thyromegaly, no signs of acromegaly. Skin:   warm, no rashes, no ecchymosis  Extremities: normal, no cyanosis, clubbing. NEUROLOGIC: Cranial nerves II through XII are intact. No gross focal neurological deficits.  PSYCHIATRIC: Mood, affect within normal limits.       ASSESSMENT AND PLAN SYNOPSIS 74 year old morbidly obese pleasant white female seen today for underlying sleep apnea At this time I would recommend obtaining information and compliance reports from her disc from her current CPAP therapy, after which I would evaluate and assess whether or not she needs a repeat split-night sleep study  Recommend obtaining disc from current CPAP machine for further assessment and compliance report  Obesity -recommend significant weight loss -recommend changing  diet  Deconditioned state -Recommend increased daily activity and exercise   COVID-19 EDUCATION: The signs and symptoms of COVID-19 were discussed with the patient and how to seek care for testing.  The importance of social distancing was discussed today. Hand Washing Techniques and avoid touching face was advised.     CURRENT MEDICATIONS REVIEWED AT LENGTH WITH PATIENT TODAY   Patient satisfied with Plan of action and management. All questions answered  Follow up in 3 months   Ash Mcelwain Patricia Pesa, M.D.  [facility]  Medical Director [place] Director [facility] Cardio-Pulmonary Department

## 2019-10-16 NOTE — Patient Instructions (Addendum)
Recommend Sleep Study for further assessment after I receive downloads and previous data Recommend weight loss

## 2019-11-23 ENCOUNTER — Ambulatory Visit: Payer: Medicare Other | Admitting: Family Medicine

## 2019-11-27 ENCOUNTER — Ambulatory Visit (INDEPENDENT_AMBULATORY_CARE_PROVIDER_SITE_OTHER): Payer: Medicare Other | Admitting: Family Medicine

## 2019-11-27 ENCOUNTER — Encounter: Payer: Self-pay | Admitting: Family Medicine

## 2019-11-27 ENCOUNTER — Other Ambulatory Visit: Payer: Self-pay

## 2019-11-27 VITALS — BP 130/70 | HR 67 | Temp 97.3°F | Resp 16 | Ht 63.0 in | Wt 279.8 lb

## 2019-11-27 DIAGNOSIS — Z6841 Body Mass Index (BMI) 40.0 and over, adult: Secondary | ICD-10-CM

## 2019-11-27 DIAGNOSIS — I10 Essential (primary) hypertension: Secondary | ICD-10-CM | POA: Diagnosis not present

## 2019-11-27 DIAGNOSIS — G4733 Obstructive sleep apnea (adult) (pediatric): Secondary | ICD-10-CM | POA: Diagnosis not present

## 2019-11-27 DIAGNOSIS — Z9989 Dependence on other enabling machines and devices: Secondary | ICD-10-CM

## 2019-11-27 DIAGNOSIS — R7309 Other abnormal glucose: Secondary | ICD-10-CM | POA: Diagnosis not present

## 2019-11-27 LAB — POCT GLYCOSYLATED HEMOGLOBIN (HGB A1C): Hemoglobin A1C: 6.2 % — AB (ref 4.0–5.6)

## 2019-11-27 NOTE — Assessment & Plan Note (Addendum)
Significant improvement down to 6.2 A1c At risk T2DM in future Morbid obesity   Plan:  1. Not on any therapy currently  2. Encourage improved lifestyle - low carb, low sugar diet, reduce portion size, continue improving regular exercise

## 2019-11-27 NOTE — Progress Notes (Signed)
Subjective:    Patient ID: Natasha Curry, female    DOB: [birth year], 74 y.o.   MRN: [id number]  Natasha Curry is a 74 y.o. female presenting on 11/27/2019 for Weight Check, Hypertension, and Pre-Diabetes   HPI   CHRONIC HTN: Reports no new concerns. Seems improved on last BP checks Current Meds - Metoprolol XL 25mg  half pill = 12.5mg  daily    Reports good compliance, takes in afternoon usually Denies CP, dyspnea, HA, edema, dizziness / lightheadedness  OSA, on CPAP Last seen by [facility] 10/2019, needs to submit her CPAP data to them. Has done better since she started using it more regularly.  Elevated A1c  Morbid Obesity BMI >49 Result A1c up to 6.8, no prior comparison Now A1c down to 6.2 today with improvement Weight down 3 lbs since last visit Improved energy on Vega w Energy shake daily, recently started - doing well with this as well for nutrition Admits some dyspnea as well during day with exertion, attributes this to her weight Improved diet now, filling up quicker, not eating as much portion Not as much exercise but goal to walk more   Depression screen [facility] 2/9 11/27/2019 04/24/2019  Decreased Interest 0 1  Down, Depressed, Hopeless 0 0  PHQ - 2 Score 0 1  Altered sleeping - 3  Tired, decreased energy - 3  Change in appetite - 3  Feeling bad or failure about yourself  - 0  Trouble concentrating - 0  Moving slowly or fidgety/restless - 0  Suicidal thoughts - 0  PHQ-9 Score - 10    Social History   Tobacco Use  . Smoking status: Never Smoker  . Smokeless tobacco: Never Used  Substance Use Topics  . Alcohol use: Yes    Alcohol/week: 3.0 standard drinks    Types: 3 Standard drinks or equivalent per week  . Drug use: Never    Review of Systems Per HPI unless specifically indicated above     Objective:    BP 130/70 (BP Location: Left Arm, Cuff Size: Normal)   Pulse 67   Temp (!) 97.3 F (36.3 C) (Temporal)   Resp 16   Ht 5\' 3"  (1.6 m)   Wt 279 lb  12.8 oz (126.9 kg)   SpO2 98%   BMI 49.56 kg/m   Wt Readings from Last 3 Encounters:  11/27/19 279 lb 12.8 oz (126.9 kg)  10/16/19 281 lb 9.6 oz (127.7 kg)  07/26/19 280 lb 3.2 oz (127.1 kg)    Physical Exam Vitals and nursing note reviewed.  Constitutional:      General: She is not in acute distress.    Appearance: She is well-developed. She is obese. She is not diaphoretic.     Comments: Well-appearing, comfortable, cooperative  HENT:     Head: Normocephalic and atraumatic.  Eyes:     General:        Right eye: No discharge.        Left eye: No discharge.     Conjunctiva/sclera: Conjunctivae normal.  Neck:     Thyroid: No thyromegaly.  Cardiovascular:     Rate and Rhythm: Normal rate and regular rhythm.     Heart sounds: Normal heart sounds. No murmur.  Pulmonary:     Effort: Pulmonary effort is normal. No respiratory distress.     Breath sounds: Normal breath sounds. No wheezing or rales.  Musculoskeletal:        General: Normal range of motion.     Cervical  back: Normal range of motion and neck supple.  Lymphadenopathy:     Cervical: No cervical adenopathy.  Skin:    General: Skin is warm and dry.     Findings: No erythema or rash.  Neurological:     Mental Status: She is alert and oriented to person, place, and time.  Psychiatric:        Behavior: Behavior normal.     Comments: Well groomed, good eye contact, normal speech and thoughts       Results for orders placed or performed in visit on 11/27/19  POCT HgB A1C  Result Value Ref Range   Hemoglobin A1C 6.2 (A) 4.0 - 5.6 %   Recent Labs    07/19/19 0815 11/27/19 0959  HGBA1C 6.8* 6.2*       Assessment & Plan:   Problem List Items Addressed This Visit    OSA on CPAP    Well controlled, chronic OSA on CPAP - Good adherence to CPAP nightly - Continue current CPAP therapy, patient seems to be benefiting from therapy  Return to [facility] for CPAP assistance      Morbid obesity with BMI of  45.0-49.9, adult ([place])    Improved wt loss 3 lbs in 2-3 months Encourage continue improving lifestyle, can continue current shake regimen      Essential hypertension    Improved HTN control, repeat manual check - Home BP readings   No known complications     Plan:  1. Continue current BP regimen Metoprolol XL 12.5mg  daily (half of 25) 2. Encourage improved lifestyle - low sodium diet, regular exercise 3. Continue monitor BP outside office, bring readings to next visit, if persistently >140/90 or new symptoms notify office sooner      Elevated hemoglobin A1c - Primary    Significant improvement down to 6.2 A1c At risk T2DM in future Morbid obesity   Plan:  1. Not on any therapy currently  2. Encourage improved lifestyle - low carb, low sugar diet, reduce portion size, continue improving regular exercise      Relevant Orders   POCT HgB A1C (Completed)        No orders of the defined types were placed in this encounter.     Follow up plan: Return in about 3 months (around 02/27/2020) for 3 month PreDM A1c, Weight, HTN.  Anticipate yearly in 07/2020 with rest of routine labs.  Nobie Putnam, [facility] 11/27/2019, 9:46 AM

## 2019-11-27 NOTE — Assessment & Plan Note (Signed)
Improved wt loss 3 lbs in 2-3 months Encourage continue improving lifestyle, can continue current shake regimen

## 2019-11-27 NOTE — Assessment & Plan Note (Signed)
Improved HTN control, repeat manual check - Home BP readings   No known complications     Plan:  1. Continue current BP regimen Metoprolol XL 12.5mg  daily (half of 25) 2. Encourage improved lifestyle - low sodium diet, regular exercise 3. Continue monitor BP outside office, bring readings to next visit, if persistently >140/90 or new symptoms notify office sooner

## 2019-11-27 NOTE — Assessment & Plan Note (Signed)
Well controlled, chronic OSA on CPAP - Good adherence to CPAP nightly - Continue current CPAP therapy, patient seems to be benefiting from therapy  Return to [facility] for CPAP assistance

## 2019-11-27 NOTE — Patient Instructions (Addendum)
Thank you for coming to the office today.  Call [facility] for sending them your CPAP machine disc for sleep info  Great job  Recent Labs    07/19/19 0815 11/27/19 0959  HGBA1C 6.8* 6.2*   BP is improved  Keep on current track.    Please schedule a Follow-up Appointment to: Return in about 3 months (around 02/27/2020) for 3 month PreDM A1c, Weight, HTN.  If you have any other questions or concerns, please feel free to call the office or send a message through [place]. You may also schedule an earlier appointment if necessary.  Additionally, you may be receiving a survey about your experience at our office within a few days to 1 week by e-mail or mail. We value your feedback.  Nobie Putnam, DO [place]

## 2019-12-20 ENCOUNTER — Telehealth: Payer: Self-pay | Admitting: Family Medicine

## 2019-12-20 DIAGNOSIS — K219 Gastro-esophageal reflux disease without esophagitis: Secondary | ICD-10-CM

## 2019-12-20 NOTE — Telephone Encounter (Signed)
omeprazole (PRILOSEC) 40 MG capsule  Pt needs refill just seen on 11/27/19 [id number] [place] [place] - [address] Phone:  [phone]  Fax:  [phone]

## 2019-12-21 MED ORDER — OMEPRAZOLE 40 MG PO CPDR: 40.0000 mg | DELAYED_RELEASE_CAPSULE | Freq: Every day | ORAL | 1 refills | Status: DC

## 2020-01-07 ENCOUNTER — Telehealth: Payer: Self-pay | Admitting: Internal Medicine

## 2020-01-08 NOTE — Telephone Encounter (Signed)
Called and spoke with patient to let her know that without having an SD card to try im not sure if it is working or not. Informed patient that I would give it a try if I have someone come in so that we know before her appointment. Patient expressed understanding and will keep her appointment already scheduled. Nothing further needed at this time.

## 2020-01-11 ENCOUNTER — Other Ambulatory Visit: Payer: Self-pay | Admitting: Family Medicine

## 2020-01-11 DIAGNOSIS — F411 Generalized anxiety disorder: Secondary | ICD-10-CM

## 2020-02-11 ENCOUNTER — Other Ambulatory Visit: Payer: Self-pay

## 2020-02-11 ENCOUNTER — Encounter: Payer: Self-pay | Admitting: Adult Health

## 2020-02-11 ENCOUNTER — Ambulatory Visit (INDEPENDENT_AMBULATORY_CARE_PROVIDER_SITE_OTHER): Payer: Medicare Other | Admitting: Adult Health

## 2020-02-11 DIAGNOSIS — Z9989 Dependence on other enabling machines and devices: Secondary | ICD-10-CM

## 2020-02-11 DIAGNOSIS — Z6841 Body Mass Index (BMI) 40.0 and over, adult: Secondary | ICD-10-CM

## 2020-02-11 DIAGNOSIS — G4733 Obstructive sleep apnea (adult) (pediatric): Secondary | ICD-10-CM

## 2020-02-11 NOTE — Progress Notes (Signed)
@Patient  ID: Natasha Curry, female    DOB: Feb 22, 1946, 74 y.o.   MRN: [id number]  Chief Complaint  Patient presents with  . Follow-up    OSA     Referring provider: Nobie Putnam *  HPI: 74 yo female seen for sleep consult 10/2019 to establish for for OSA (diagnosed around 2011)  History of morbid obesity s/p gastric bypass surgery , lost 100lbs but gained back.   TEST/EVENTS :   02/12/2020 Follow up : OSA  Patient returns for a 4 month follow up for sleep apnea. Patient was seen last visit to establish for OSA . She says has been on CPAP for long time does well with CPAP but has daytime sleepiness. Returns today with CPAP download that shows excellent compliance with 100% usage with daily average usage at 10hr daily , AHI  5.3 .  Says wanted to make sure CPAP was working. Main compliant is low energy .  Not on sedating medications. Has tried stimulant med in past but did not like it with no perceived benefit that she can remember.  Not very active , sedentary lifestyle.    No Known Allergies  Immunization History  Administered Date(s) Administered  . Influenza, High Dose Seasonal PF 05/12/2019  . PFIZER SARS-COV-2 Vaccination 10/22/2019, 11/12/2019    Past Medical History:  Diagnosis Date  . Allergy   . Anxiety   . 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 Colon polyp   . Depression   . Eczema   . GERD (gastroesophageal reflux disease)   . Kidney stones   . Psoriasis   . Sleep apnea   . Tremor     Tobacco History: Social History   Tobacco Use  Smoking Status Never Smoker  Smokeless Tobacco Never Used   Counseling given: Not Answered   Outpatient Medications Prior to Visit  Medication Sig Dispense Refill  . atorvastatin (LIPITOR) 10 MG tablet Take 10 mg by mouth daily.    Marland Kitchen escitalopram (LEXAPRO) 20 MG tablet TAKE 1 TABLET BY MOUTH EVERY DAY 90 tablet 0  . metoprolol succinate (TOPROL-XL) 25 MG 24 hr tablet Take 0.5 tablets (12.5 mg total) by mouth daily. 45  tablet 1  . omeprazole (PRILOSEC) 40 MG capsule Take 1 capsule (40 mg total) by mouth daily. 90 capsule 1   No facility-administered medications prior to visit.     Review of Systems:   Constitutional:   No  weight loss, night sweats,  Fevers, chills,  +fatigue, or  lassitude.  HEENT:   No headaches,  Difficulty swallowing,  Tooth/dental problems, or  Sore throat,                No sneezing, itching, ear ache, nasal congestion, post nasal drip,   CV:  No chest pain,  Orthopnea, PND, swelling in lower extremities, anasarca, dizziness, palpitations, syncope.   GI  No heartburn, indigestion, abdominal pain, nausea, vomiting, diarrhea, change in bowel habits, loss of appetite, bloody stools.   Resp:    No excess mucus, no productive cough,  No non-productive cough,  No coughing up of blood.  No change in color of mucus.  No wheezing.  No chest wall deformity  Skin: no rash or lesions.  GU: no dysuria, change in color of urine, no urgency or frequency.  No flank pain, no hematuria   MS:  No joint pain or swelling.  No decreased range of motion.  No back pain.    Physical Exam  BP  124/80 (BP Location: Left Arm, Cuff Size: Large)   Pulse 78   Temp 97.6 F (36.4 C) (Temporal)   Ht 5\' 3"  (1.6 m)   Wt 281 lb (127.5 kg)   SpO2 96% Comment: on RA  BMI 49.78 kg/m   GEN: A/Ox3; pleasant , NAD, BMI 49    HEENT:  [place]/AT,    NOSE-clear, THROAT-clear, no lesions, no postnasal drip or exudate noted.   NECK:  Supple w/ fair ROM; no JVD; normal carotid impulses w/o bruits; no thyromegaly or nodules palpated; no lymphadenopathy.    RESP  Clear  P & A; w/o, wheezes/ rales/ or rhonchi. no accessory muscle use, no dullness to percussion  CARD:  RRR, no m/r/g, tr  peripheral edema, pulses intact, no cyanosis or clubbing.  GI:   Soft & nt; nml bowel sounds; no organomegaly or masses detected.   Musco: Warm bil, no deformities or joint swelling noted.   Neuro: alert, no focal deficits  noted.    Skin: Warm, no lesions or rashes    Lab Results:  CBC  BNP No results found for: BNP  ProBNP No results found for: PROBNP  Imaging: No results found.    No flowsheet data found.  No results found for: NITRICOXIDE      Assessment & Plan:   OSA on CPAP Excellent control and compliance on CPAP  Has some residual daytime sleepiness /fatigue discussion regarding medications to help with this , she will hold off for now.   Plan  Patient Instructions  Keep up great work on CPAP  Wear CPAP each night  Do not drive if sleepy  Work on healthy weight .  Follow up with Dr. Mortimer Fries in 1 year and As needed       Morbid obesity with BMI of 45.0-49.9, adult ([place]) Healthy weight loss.      Rexene Edison, NP 02/12/2020

## 2020-02-11 NOTE — Patient Instructions (Signed)
Keep up great work on CPAP  Wear CPAP each night  Do not drive if sleepy  Work on healthy weight .  Follow up with Dr. Mortimer Fries in 1 year and As needed

## 2020-02-12 NOTE — Assessment & Plan Note (Signed)
Healthy weight loss 

## 2020-02-12 NOTE — Assessment & Plan Note (Signed)
Excellent control and compliance on CPAP  Has some residual daytime sleepiness /fatigue discussion regarding medications to help with this , she will hold off for now.   Plan  Patient Instructions  Keep up great work on CPAP  Wear CPAP each night  Do not drive if sleepy  Work on healthy weight .  Follow up with Dr. Mortimer Fries in 1 year and As needed

## 2020-02-21 ENCOUNTER — Other Ambulatory Visit: Payer: Self-pay | Admitting: Family Medicine

## 2020-02-21 DIAGNOSIS — I10 Essential (primary) hypertension: Secondary | ICD-10-CM

## 2020-02-21 MED ORDER — METOPROLOL SUCCINATE ER 25 MG PO TB24: 12.5000 mg | ORAL_TABLET | Freq: Every day | ORAL | 1 refills | Status: DC

## 2020-02-21 NOTE — Telephone Encounter (Signed)
Medication Refill - Medication: metoprolol succinate (TOPROL-XL) 25 MG 24 hr tablet   Has the patient contacted their pharmacy?yes (Agent: If no, request that the patient contact the pharmacy for the refill.) (Agent: If yes, when and what did the pharmacy advise?)contact PCP  Preferred Pharmacy (with phone number or street name):  [id number] [place] [place] Phone:  [phone]  Fax:  [phone]       Agent: Please be advised that RX refills may take up to 3 business days. We ask that you follow-up with your pharmacy.

## 2020-03-21 ENCOUNTER — Encounter: Payer: Self-pay | Admitting: Family Medicine

## 2020-03-21 ENCOUNTER — Ambulatory Visit (INDEPENDENT_AMBULATORY_CARE_PROVIDER_SITE_OTHER): Payer: Medicare Other | Admitting: Family Medicine

## 2020-03-21 ENCOUNTER — Other Ambulatory Visit: Payer: Self-pay

## 2020-03-21 VITALS — BP 162/54 | HR 68 | Temp 97.3°F | Resp 16 | Ht 63.0 in | Wt 280.6 lb

## 2020-03-21 DIAGNOSIS — M7062 Trochanteric bursitis, left hip: Secondary | ICD-10-CM | POA: Diagnosis not present

## 2020-03-21 MED ORDER — BACLOFEN 10 MG PO TABS: 5.0000 mg | ORAL_TABLET | Freq: Three times a day (TID) | ORAL | 1 refills | Status: DC | PRN

## 2020-03-21 MED ORDER — PREDNISONE 20 MG PO TABS: ORAL_TABLET | ORAL | 0 refills | Status: DC

## 2020-03-21 NOTE — Patient Instructions (Addendum)
Thank you for coming to the office today.  1. For your Hip Pain - I think that this is due to Bursitis and some associated Muscle Spasms or strain. Also sometimes your - Sciatic Nerve can be affected causing some of your radiation and numbness down your legs. 2. Start Prednisone taper Take daily with food. Start with 60mg  (3 pills) x 2 days, then reduce to 40mg  (2 pills) x 2 days, then 20mg  (1 pill) x 3 days, Normal  3. Start Baclofen (Lioresal) 10mg  tablets - cut in half for 5mg  at night for muscle relaxant - may make you sedated or sleepy (be careful driving or working on this) if tolerated you can take every 8 hours, half or whole tab  4. May use Tylenol Extra Str 500mg  tabs - may take 1-2 tablets every 6 hours as needed  5. Recommend to start using heating pad on your lower back 1-2x daily for few weeks  Also try a Wedge Seat Cushion to avoid nerve pinching when sitting prolonged period of time.  This pain may take weeks to months to fully resolve, but hopefully it will respond to the medicine initially. All back injuries (small or serious) are slow to heal since we use our back muscles every day. Be careful with turning, twisting, lifting, sitting / standing for prolonged periods, and avoid re-injury.  If your symptoms significantly worsen with more pain, or new symptoms with weakness in one or both legs, new or different shooting leg pains, numbness in legs or groin, loss of control or retention of urine or bowel movements, please call back for advice and you may need to go directly to the Emergency Department.  Please schedule a Follow-up Appointment to: Return in about 22 weeks (around 08/22/2020), or if symptoms worsen or fail to improve, for hip pain.  If you have any other questions or concerns, please feel free to call the office or send a message through [place]. You may also schedule an earlier appointment if necessary.  Additionally, you may be receiving a survey about your  experience at our office within a few days to 1 week by e-mail or mail. We value your feedback.  Nobie Putnam, DO [place]

## 2020-03-21 NOTE — Progress Notes (Signed)
Subjective:    Patient ID: Natasha Curry, female    DOB: [birth year], 74 y.o.   MRN: [id number]  Natasha Curry is a 74 y.o. female presenting on 03/21/2020 for Hip Pain (onset 3 days--Left side as per pt left hip pain, possible bursitis)  Patient presents for a same day appointment.   HPI   Acute Left Hip Pain / Bursitis Reports symptoms onset 3+ days ago with worsening hip and gluteal region, she has difficulty with prolong sitting with worsening, difficulty if laying on L side, waking her up at night. Radiating down top of thigh to her knee but not beyond. Describes constant ache and radiating pain. Not intermittent. Not numbness or tingling. - Started OTC Tylenol Ex Str 500mg  x 2 = 1000mg  2-3 times a day PRN - She has tried ice packs, limited relief. - Recently her husband has had surgery and she is helping him more. 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 No recent imaging or known OA of Hip - Denies any fall or trauma, or lifting injury   Depression screen [facility] 2/9 11/27/2019 04/24/2019  Decreased Interest 0 1  Down, Depressed, Hopeless 0 0  PHQ - 2 Score 0 1  Altered sleeping - 3  Tired, decreased energy - 3  Change in appetite - 3  Feeling bad or failure about yourself  - 0  Trouble concentrating - 0  Moving slowly or fidgety/restless - 0  Suicidal thoughts - 0  PHQ-9 Score - 10    Social History   Tobacco Use  . Smoking status: Never Smoker  . Smokeless tobacco: Never Used  Vaping Use  . Vaping Use: Never used  Substance Use Topics  . Alcohol use: Yes    Alcohol/week: 3.0 standard drinks    Types: 3 Standard drinks or equivalent per week  . Drug use: Never    Review of Systems Per HPI unless specifically indicated above     Objective:    BP (!) 162/54   Pulse 68   Temp (!) 97.3 F (36.3 C) (Temporal)   Resp 16   Ht 5\' 3"  (1.6 m)   Wt 280 lb 9.6 oz (127.3 kg)   SpO2 97%   BMI 49.71 kg/m   Wt Readings from Last 3 Encounters:  03/21/20  280 lb 9.6 oz (127.3 kg)  02/11/20 281 lb (127.5 kg)  11/27/19 279 lb 12.8 oz (126.9 kg)    Physical Exam Vitals and nursing note reviewed.  Constitutional:      General: She is not in acute distress.    Appearance: She is well-developed. She is obese. She is not diaphoretic.     Comments: Well-appearing, comfortable, cooperative  HENT:     Head: Normocephalic and atraumatic.  Eyes:     General:        Right eye: No discharge.        Left eye: No discharge.     Conjunctiva/sclera: Conjunctivae normal.  Cardiovascular:     Rate and Rhythm: Normal rate.  Pulmonary:     Effort: Pulmonary effort is normal.  Musculoskeletal:     Comments: Low Back / L hip and Greater Trochanter Inspection: Large body habitus BACK - Normal appearance, no spinal deformity, symmetrical. HIP - Normal appearance, symmetrical, no obvious leg length or pelvis deformity  Palpation: BACK - No tenderness over spinous processes. Bilateral lumbar paraspinal muscles non-tender and without hypertonicity/spasm. HIP - Mild tender to palpation deeper L greater trochanter region of  lateral upper thigh also GLUTEAL region. Lower extremity thigh calf soft non tender no spasm.  ROM: BACK - Full active ROM forward flex / back extension, rotation L/R without discomfort HIP - Bilateral hip flex/ext supine normal, internal and external rotation normal without problem or limitation.  Strength: Bilateral hip flex/ext 5/5, knee flex/ext 5/5, ankle dorsiflex/plantarflex 5/5 Neurovascular: intact distal sensation to light touch   Skin:    General: Skin is warm and dry.     Findings: No erythema or rash.  Neurological:     Mental Status: She is alert and oriented to person, place, and time.  Psychiatric:        Behavior: Behavior normal.     Comments: Well groomed, good eye contact, normal speech and thoughts    Results for orders placed or performed in visit on 11/27/19  POCT HgB A1C  Result Value Ref Range    Hemoglobin A1C 6.2 (A) 4.0 - 5.6 %      Assessment & Plan:   Problem List Items Addressed This Visit    None    Visit Diagnoses    Trochanteric bursitis of left hip    -  Primary   Relevant Medications   baclofen (LIORESAL) 10 MG tablet   predniSONE (DELTASONE) 20 MG tablet      Acute worsening problem with L Hip pain, likely trochanteric bursitis given history and exam with point tenderness. Suspect underlying osteoarthritis as possible cause and more increased activity at home now. without known injury or trauma. No prior hip problems or imaging. - Radiating pain into thigh but not radicular symptoms. not responding to conservative therapy  Plan: 1. Start 7 day Prednisone taper as instructed, not on oral NSAID 2. Start muscle relaxant with Baclofen 10mg  tabs - take 5-10mg  up to BID PRN, titrate up as tolerated caution sedation 3. May use Tylenol PRN for breakthrough 4. Encouraged use of heating pad 1-2x daily for now then PRN. 5. Follow-up 4-6 weeks if not improving, consider hip x-rays, future troch bursa steroid injection, Ortho vs PT  Meds ordered this encounter  Medications  . baclofen (LIORESAL) 10 MG tablet    Sig: Take 0.5-1 tablets (5-10 mg total) by mouth 3 (three) times daily as needed for muscle spasms.    Dispense:  30 each    Refill:  1  . predniSONE (DELTASONE) 20 MG tablet    Sig: Take daily with food. Start with 60mg  (3 pills) x 2 days, then reduce to 40mg  (2 pills) x 2 days, then 20mg  (1 pill) x 3 days    Dispense:  13 tablet    Refill:  0      Follow up plan: Return in about 2 weeks (around 04/04/2020), or if symptoms worsen or fail to improve, for hip pain.   Nobie Putnam, [facility] 03/21/2020, 3:46 PM

## 2020-03-24 ENCOUNTER — Telehealth: Payer: Self-pay

## 2020-03-24 DIAGNOSIS — M7062 Trochanteric bursitis, left hip: Secondary | ICD-10-CM

## 2020-03-24 DIAGNOSIS — M25552 Pain in left hip: Secondary | ICD-10-CM

## 2020-03-24 NOTE — Telephone Encounter (Signed)
As per patient her pain is coming back few hours after taking Rx med's and Tylenol as well, today her pain is not going away which is effecting her ROM, as per Dr Raliegh Ip inform patient about orthopedic referral and not the x-ray in the office today,  referral co-ordinator will notified her after apt is scheduled.

## 2020-03-24 NOTE — Telephone Encounter (Signed)
Copied from [place] [phone]. Topic: General - Other >> Mar 24, 2020 11:41 AM Gillis Ends D wrote: Reason for CRM: Patient is requesting an MRI or an X-Ray to find out the cause of the pain. She says it is really bad. She is only getting about 8 hours of relief and then the pain returns. Please give her a call back with the direction you would like to try next to relieve the pain.

## 2020-03-24 NOTE — Telephone Encounter (Signed)
Referral placed on Monday 7/19 to [facility].  Nobie Putnam, [facility] 03/24/2020, 5:49 PM

## 2020-03-28 DIAGNOSIS — M5416 Radiculopathy, lumbar region: Secondary | ICD-10-CM | POA: Insufficient documentation

## 2020-03-31 ENCOUNTER — Ambulatory Visit: Payer: Medicare Other | Admitting: Family Medicine

## 2020-03-31 ENCOUNTER — Other Ambulatory Visit: Payer: Self-pay | Admitting: Family Medicine

## 2020-03-31 DIAGNOSIS — E782 Mixed hyperlipidemia: Secondary | ICD-10-CM

## 2020-03-31 NOTE — Telephone Encounter (Signed)
Requested medication (s) are due for refill today:  Yes  Requested medication (s) are on the active medication list:  Yes  Future visit scheduled:  Yes  Last Refill: Historical Provider  Notes to clinic: last filled by Historical Provider  Requested Prescriptions  Pending Prescriptions Disp Refills   atorvastatin (LIPITOR) 10 MG tablet      Sig: Take 1 tablet (10 mg total) by mouth daily.      Cardiovascular:  Antilipid - Statins Failed - 03/31/2020  4:18 PM      Failed - LDL in normal range and within 360 days    LDL Cholesterol (Calc)  Date Value Ref Range Status  07/19/2019 105 (H) mg/dL (calc) Final    Comment:    Reference range: <100 . Desirable range <100 mg/dL for primary prevention;   <70 mg/dL for patients with CHD or diabetic patients  with > or = 2 CHD risk factors. Marland Kitchen LDL-C is now calculated using the Martin-Hopkins  calculation, which is a validated novel method providing  better accuracy than the Friedewald equation in the  estimation of LDL-C.  Cresenciano Genre et al. Annamaria Helling. 2233;612(24): 2061-2068  (http://education.[employer].com/faq/FAQ164)           Failed - HDL in normal range and within 360 days    HDL  Date Value Ref Range Status  07/19/2019 31 (L) > OR = 50 mg/dL Final          Failed - Triglycerides in normal range and within 360 days    Triglycerides  Date Value Ref Range Status  07/19/2019 185 (H) <150 mg/dL Final          Passed - Total Cholesterol in normal range and within 360 days    Cholesterol  Date Value Ref Range Status  07/19/2019 167 <200 mg/dL Final          Passed - Patient is not pregnant      Passed - Valid encounter within last 12 months    Recent Outpatient Visits           1 week ago Trochanteric bursitis of left hip   [place], DO   4 months ago Elevated hemoglobin A1c   [place], DO   6 months ago Acute otitis media,  unspecified otitis media type   [place], DO   8 months ago Elevated hemoglobin A1c   [place], DO   11 months ago OSA on CPAP   [place], [place]

## 2020-03-31 NOTE — Telephone Encounter (Signed)
Medication Refill - Medication: atorvastatin (LIPITOR) 10 MG tablet    Has the patient contacted their pharmacy? Yes.   (Agent: If no, request that the patient contact the pharmacy for the refill.) (Agent: If yes, when and what did the pharmacy advise?)  Preferred Pharmacy (with phone number or street name):  [id number] - [place], [address]  [address] [place] [address]  Phone: [phone] Fax: [phone]     Agent: Please be advised that RX refills may take up to 3 business days. We ask that you follow-up with your pharmacy.

## 2020-04-01 MED ORDER — ATORVASTATIN CALCIUM 10 MG PO TABS: 10.0000 mg | ORAL_TABLET | Freq: Every day | ORAL | 1 refills | Status: DC

## 2020-04-04 ENCOUNTER — Telehealth: Payer: Self-pay | Admitting: Family Medicine

## 2020-04-04 NOTE — Telephone Encounter (Signed)
Pt has pain meds but still has pain on her side/ Pt would like to speak with Dr. Parks Ranger please advise asap

## 2020-04-06 ENCOUNTER — Other Ambulatory Visit: Payer: Self-pay | Admitting: Family Medicine

## 2020-04-06 DIAGNOSIS — F411 Generalized anxiety disorder: Secondary | ICD-10-CM

## 2020-04-06 NOTE — Telephone Encounter (Signed)
Requested Prescriptions  Pending Prescriptions Disp Refills  . escitalopram (LEXAPRO) 20 MG tablet [Pharmacy Med Name: ESCITALOPRAM 20 MG TABLET] 90 tablet 1    Sig: TAKE 1 TABLET BY MOUTH EVERY DAY     Psychiatry:  Antidepressants - SSRI Passed - 04/06/2020  8:59 AM      Passed - Valid encounter within last 6 months    Recent Outpatient Visits          2 weeks ago Trochanteric bursitis of left hip   [place], DO   4 months ago Elevated hemoglobin A1c   [place], DO   6 months ago Acute otitis media, unspecified otitis media type   [place], DO   8 months ago Elevated hemoglobin A1c   [place], DO   11 months ago OSA on CPAP   [place], [place]

## 2020-04-07 ENCOUNTER — Emergency Department: Payer: Medicare Other

## 2020-04-07 ENCOUNTER — Emergency Department
Admission: EM | Admit: 2020-04-07 | Discharge: 2020-04-07 | Disposition: A | Discharge status: Home / Self Care | Payer: Medicare Other | Attending: Emergency Medicine | Admitting: Emergency Medicine

## 2020-04-07 ENCOUNTER — Encounter: Payer: Self-pay | Admitting: Emergency Medicine

## 2020-04-07 ENCOUNTER — Other Ambulatory Visit: Payer: Self-pay

## 2020-04-07 DIAGNOSIS — M549 Dorsalgia, unspecified: Secondary | ICD-10-CM

## 2020-04-07 DIAGNOSIS — Z79899 Other long term (current) drug therapy: Secondary | ICD-10-CM | POA: Insufficient documentation

## 2020-04-07 DIAGNOSIS — M545 Low back pain: Secondary | ICD-10-CM | POA: Diagnosis not present

## 2020-04-07 DIAGNOSIS — M25552 Pain in left hip: Secondary | ICD-10-CM | POA: Diagnosis present

## 2020-04-07 DIAGNOSIS — G8929 Other chronic pain: Secondary | ICD-10-CM | POA: Insufficient documentation

## 2020-04-07 DIAGNOSIS — I10 Essential (primary) hypertension: Secondary | ICD-10-CM | POA: Insufficient documentation

## 2020-04-07 MED ORDER — KETOROLAC TROMETHAMINE 30 MG/ML IJ SOLN
15.0000 mg | Freq: Once | INTRAMUSCULAR | Status: AC
  Administered 2020-04-07: 15 mg via INTRAVENOUS
  Filled 2020-04-07: qty 1

## 2020-04-07 MED ORDER — HYDROMORPHONE HCL 1 MG/ML IJ SOLN
0.5000 mg | Freq: Once | INTRAMUSCULAR | Status: AC
  Administered 2020-04-07: 0.5 mg via INTRAVENOUS
  Filled 2020-04-07: qty 1

## 2020-04-07 MED ORDER — LIDOCAINE 5 % EX PTCH: 1.0000 | MEDICATED_PATCH | Freq: Two times a day (BID) | CUTANEOUS | 0 refills | Status: AC

## 2020-04-07 MED ORDER — LIDOCAINE 5 % EX PTCH
1.0000 | MEDICATED_PATCH | CUTANEOUS | Status: DC
  Administered 2020-04-07: 1 via TRANSDERMAL
  Filled 2020-04-07: qty 1

## 2020-04-07 MED ORDER — HYDROMORPHONE BOLUS VIA INFUSION: 0.5000 mg | Freq: Once | INTRAVENOUS | Status: DC

## 2020-04-07 NOTE — ED Provider Notes (Addendum)
[facility] Emergency Department Provider Note   ____________________________________________   First MD Initiated Contact with Patient 04/07/20 1048     (approximate)  I have reviewed the triage vital signs and the nursing notes.   HISTORY  Chief Complaint Back Pain and Hip Pain    HPI Natasha Curry is a 74 y.o. female patient complains of 3 weeks of left lateral hip pain that radiates to the anterior left thigh.  Patient also complains of chronic back pain.  No provocative incident for complaint.  Patient seen by PCP and orthopedics.  Patient the x-rays were taken but she does not know the results.  Patient state pain is refractory to cortisone injection to the greater trochanter.  Patient denies bladder or bowel dysfunction.         Past Medical History:  Diagnosis Date  . Allergy   . Anxiety   . 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 Colon polyp   . Depression   . Eczema   . GERD (gastroesophageal reflux disease)   . Kidney stones   . Psoriasis   . Sleep apnea   . Tremor     Patient Active Problem List   Diagnosis Date Noted  . Essential hypertension 09/25/2019  . Elevated hemoglobin A1c 07/26/2019  . 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 OSA on CPAP 04/24/2019  . Bilateral lower extremity edema 04/24/2019  . Morbid obesity with BMI of 45.0-49.9, adult ([place]) 04/24/2019  . Mixed hyperlipidemia 04/24/2019  . GAD (generalized anxiety disorder) 04/24/2019    Past Surgical History:  Procedure Laterality Date  . CHOLECYSTECTOMY  2000  . GASTRIC BYPASS  2013  . TOTAL KNEE ARTHROPLASTY  2014    Prior to Admission medications   Medication Sig Start Date End Date Taking? Authorizing Provider  atorvastatin (LIPITOR) 10 MG tablet Take 1 tablet (10 mg total) by mouth daily. 04/01/20   Karamalegos, Devonne Doughty, DO  baclofen (LIORESAL) 10 MG tablet Take 0.5-1 tablets (5-10 mg total) by  mouth 3 (three) times daily as needed for muscle spasms. 03/21/20   Karamalegos, Devonne Doughty, DO  escitalopram (LEXAPRO) 20 MG tablet TAKE 1 TABLET BY MOUTH EVERY DAY 04/06/20   Karamalegos, Devonne Doughty, DO  lidocaine (LIDODERM) 5 % Place 1 patch onto the skin every 12 (twelve) hours. Remove & Discard patch within 12 hours or as directed by MD 04/07/20 04/07/21  Sable Feil, PA-C  metoprolol succinate (TOPROL-XL) 25 MG 24 hr tablet Take 0.5 tablets (12.5 mg total) by mouth daily. 02/21/20   Karamalegos, Devonne Doughty, DO  omeprazole (PRILOSEC) 40 MG capsule Take 1 capsule (40 mg total) by mouth daily. 12/21/19   Olin Hauser, DO    Allergies Patient has no known allergies.  Family History  Problem Relation Age of Onset  . Cancer Mother        adrenal  . Heart attack Father   . Heart disease Father   . Stroke Father     Social History Social History   Tobacco Use  . Smoking status: Never Smoker  . Smokeless tobacco: Never Used  Vaping Use  . Vaping Use: Never used  Substance Use Topics  . Alcohol use: Yes    Alcohol/week: 3.0 standard drinks    Types: 3 Standard drinks or equivalent per week  . Drug use: Never    Review of Systems Constitutional: No fever/chills Eyes: No visual changes.  ENT: No sore throat. Cardiovascular: Denies chest pain. Respiratory: Denies shortness of breath. Gastrointestinal: No abdominal pain.  No nausea, no vomiting.  No diarrhea.  No constipation. Genitourinary: Negative for dysuria. Musculoskeletal: Positive for back/left hip pain. Skin: Negative for rash. Neurological: Negative for headaches, focal weakness or numbness. Psychiatric: Anxiety and depression.  Endocrine:  Hyperlipidemia and hypertension. Hematological/Lymphatic:   ____________________________________________   PHYSICAL EXAM:  VITAL SIGNS: ED Triage Vitals [04/07/20 0913]  Enc Vitals Group     BP (!) 182/116     Pulse Rate 93     Resp 20     Temp      Temp src       SpO2 98 %     Weight (!) 280 lb (127 kg)     Height 5\' 3"  (1.6 m)     Head Circumference      Peak Flow      Pain Score 10     Pain Loc      Pain Edu?      Excl. in [place]?     Constitutional: Alert and oriented. Well appearing and in no acute distress.  BMI 49.60. Cardiovascular: Normal rate, regular rhythm. Grossly normal heart sounds.  Good peripheral circulation.  Elevated blood pressure. Respiratory: Normal respiratory effort.  No retractions. Lungs CTAB. Musculoskeletal: No obvious deformity of the left hip.  No leg length discrepancy.  Patient is moderate guarding palpation left greater trochanter Neurologic:  Normal speech and language. No gross focal neurologic deficits are appreciated. No gait instability. Skin:  Skin is warm, dry and intact. No rash noted. Psychiatric: Mood and affect are normal. Speech and behavior are normal.  ____________________________________________   LABS (all labs ordered are listed, but only abnormal results are displayed)  Labs Reviewed - No data to display ____________________________________________  EKG   ____________________________________________  RADIOLOGY  ED MD interpretation:    Official radiology report(s): CT Lumbar Spine Wo Contrast  Result Date: 04/07/2020 CLINICAL DATA:  74 year old female with 3 weeks of pain radiating to the left hip and leg. EXAM: CT LUMBAR SPINE WITHOUT CONTRAST TECHNIQUE: Multidetector CT imaging of the lumbar spine was performed without intravenous contrast administration. Multiplanar CT image reconstructions were also generated. COMPARISON:  Left hip CT this morning reported separately. FINDINGS: Segmentation: Normal. Alignment: Straightening of lumbar lordosis. Slight dextroconvex lumbar scoliosis. No spondylolisthesis. Vertebrae: Osteopenia. No acute osseous abnormality identified. Intact visible sacrum and SI joints. Paraspinal and other soft tissues: Partially visible exophytic but simple fluid  density mass of the left renal midpole, likely a simple cyst. Superimposed left lower pole nephrolithiasis. Partially visible probable benign right renal parapelvic cyst. Aortoiliac calcified atherosclerosis. Negative lumbar paraspinal soft tissues. Disc levels: T12-L1:  Negative. L1-L2:  Negative. L2-L3: Moderate to severe disc space loss with vacuum disc and circumferential disc osteophyte complex. Mild spinal stenosis. L3-L4: Severe disc space loss with vacuum disc and circumferential disc osteophyte complex. Mild to moderate posterior element hypertrophy. Mild to moderate spinal stenosis and bilateral L3 foraminal stenosis. L4-L5: Severe disc space loss with vacuum disc. Circumferential disc osteophyte complex and mild to moderate posterior element hypertrophy. Mild to moderate spinal and left greater than right L4 foraminal stenosis. L5-S1: Severe disc space loss with vacuum disc. Circumferential disc osteophyte complex. Mild to moderate facet hypertrophy. No spinal stenosis. Moderate bilateral L5 foraminal stenosis. IMPRESSION: 1. Osteopenia. No acute osseous abnormality in the lumbar spine. 2. 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 3. Left nephrolithiasis. 4. Aortic Atherosclerosis (ICD10-I70.0). Electronically Signed   By: Genevie Ann M.D.   On: 04/07/2020 13:46   CT Hip Left Wo Contrast  Result Date: 04/07/2020 CLINICAL DATA:  Left hip pain.  No known injury. EXAM: CT OF THE LEFT HIP WITHOUT CONTRAST TECHNIQUE: Multidetector CT imaging of the left hip was performed according to the standard protocol. Multiplanar CT image reconstructions were also generated. COMPARISON:  None. FINDINGS: Bones/Joint/Cartilage There is no acute bony or joint abnormality. No subchondral sclerosis or cyst formation about the left hip. Minimal osteophytosis along the periphery of the acetabulum is noted. Left  hip joint space is preserved. No worrisome bony lesion is seen. 0.6 cm bone island in the femoral head is noted. There is mild osteophytosis about the symphysis pubis. No hip joint effusion. Ligaments Suboptimally assessed by CT. Muscles and Tendons Intact and normal in appearance. Soft tissues Negative. IMPRESSION: Very mild appearing left hip osteoarthritis and mild degenerative disease at the symphysis pubis. The exam is otherwise negative. Electronically Signed   By: Inge Rise M.D.   On: 04/07/2020 11:55    ____________________________________________   PROCEDURES  Procedure(s) performed (including Critical Care):  Procedures   ____________________________________________   INITIAL IMPRESSION / ASSESSMENT AND PLAN / ED COURSE  As part of my medical decision making, I reviewed the following data within the [facility]     Patient presents with chronic/back hip pain which is worse in the past 3 weeks.  Patient refractory to cortisone injections in the greater trochanter area.  CT was remarkable  for mild arthritic changes.  Patient advised continue previous medications.  Patient was given IV Dilaudid 0.5 mg, Toradol and 15 mg, and Lidoderm patch was applied.  Patient advised follow-up PCP.    Natasha Curry was evaluated in Emergency Department on 04/07/2020 for the symptoms described in the history of present illness. She was evaluated in the context of the global COVID-19 pandemic, which necessitated consideration that the patient might be at risk for infection with the SARS-CoV-2 virus that causes COVID-19. Institutional protocols and algorithms that pertain to the evaluation of patients at risk for COVID-19 are in a state of rapid change based on information released by regulatory bodies including the CDC and federal and state organizations. These policies and algorithms were followed during the patient's care in the ED.        ____________________________________________   FINAL CLINICAL IMPRESSION(S) / ED DIAGNOSES  Final diagnoses:  Left hip pain  Chronic midline back pain, unspecified back location  Chronic left hip pain     ED Discharge Orders         Ordered    lidocaine (LIDODERM) 5 %  Every 12 hours     Discontinue  Reprint     04/07/20 1221           Note:  This document was prepared using Dragon voice recognition software and may include unintentional dictation errors.    Sable Feil, PA-C 04/07/20 1226    Sable Feil, PA-C 04/07/20 1419    Lucrezia Starch, MD 04/07/20 1626

## 2020-04-07 NOTE — Telephone Encounter (Signed)
Unable to relay message please let patient know provider's recommendation.

## 2020-04-07 NOTE — Telephone Encounter (Signed)
Can call her at end of day today or tomorrow. She is in ED today.  She will need an office visit. I cannot treat her pain on phone unfortunately  Saw her already on 03/21/20, and then we talked to her on 7/19 when pain didn't improve, she was referred to ortho, they saw her already and now still has an issue with pain.  She needs to talk to orthopedics  It looks like she went to hospital ED today on Monday 04/07/20  She can schedule hospital follow-up in future.  Nobie Putnam, DO [facility] 04/07/2020, 1:32 PM

## 2020-04-07 NOTE — Discharge Instructions (Addendum)
Hip CT was negative except for mild arthritic changes.  Lumbar CT was positive for multilevel arthritic changes with no acute findings.  Advised continue previous medications and apply Lidoderm patches for pain control until reevaluation by PCP/orthopedics for consult the pain management.

## 2020-04-07 NOTE — Telephone Encounter (Signed)
Left voicemail to confirm appt for 04/08/20 at 0940 am.

## 2020-04-07 NOTE — ED Triage Notes (Signed)
Pt in via EMS from home with c/o left sided hip pain and back pain. EMS reports per pt, she received a cortisone injection for this pain butt it did not give any relief.  Pt reports pain is to left hip ad radiates down her left thigh. Pt reports pain for 3 weeks, denies injuries.

## 2020-04-07 NOTE — ED Notes (Signed)
See triage note  Presents vis EMS  States she is having left hip pain which is moving into leg  States she has been on 2 rounds of prednisone and oxycodone and tramadol     States pain is unbearable

## 2020-04-08 ENCOUNTER — Telehealth: Payer: Self-pay | Admitting: Family Medicine

## 2020-04-08 ENCOUNTER — Ambulatory Visit (INDEPENDENT_AMBULATORY_CARE_PROVIDER_SITE_OTHER): Payer: Medicare Other | Admitting: Family Medicine

## 2020-04-08 ENCOUNTER — Encounter: Payer: Self-pay | Admitting: Family Medicine

## 2020-04-08 VITALS — BP 146/63 | HR 84 | Temp 97.1°F | Resp 16 | Ht 63.0 in | Wt 273.6 lb

## 2020-04-08 DIAGNOSIS — M5442 Lumbago with sciatica, left side: Secondary | ICD-10-CM

## 2020-04-08 DIAGNOSIS — M48061 Spinal stenosis, lumbar region without neurogenic claudication: Secondary | ICD-10-CM | POA: Diagnosis not present

## 2020-04-08 DIAGNOSIS — G8929 Other chronic pain: Secondary | ICD-10-CM | POA: Diagnosis not present

## 2020-04-08 DIAGNOSIS — M5416 Radiculopathy, lumbar region: Secondary | ICD-10-CM

## 2020-04-08 MED ORDER — OXYCODONE-ACETAMINOPHEN 5-325 MG PO TABS: 1.0000 | ORAL_TABLET | ORAL | 0 refills | Status: DC | PRN

## 2020-04-08 MED ORDER — CYCLOBENZAPRINE HCL 10 MG PO TABS: 10.0000 mg | ORAL_TABLET | Freq: Three times a day (TID) | ORAL | 0 refills | Status: DC | PRN

## 2020-04-08 NOTE — Telephone Encounter (Signed)
She is talking about cyclobenzaprine unable to reach the patient but left message.

## 2020-04-08 NOTE — Telephone Encounter (Signed)
Called patient.  She said that her husband has already purchased both lidoderm and cyclobenzaprine with cash, instead of waiting for PA approval.  I advised her that we just received PA and it is unlikely to be covered regardless.  I would not do PA at this time. She can use goodrx coupon next time, advised her that is cheaper option.  Otherwise Tizanidine could be approved we could switch it to that in future if need.  She has other med oxycodone as advised  Nobie Putnam, [facility] 04/08/2020, 5:07 PM

## 2020-04-08 NOTE — Telephone Encounter (Signed)
Copied from [place] [phone]. Topic: General - Inquiry >> Apr 08, 2020  4:04 PM Greggory Keen D wrote: Reason for CRM: Pt's husband called saying his wife in the office today and now at home with a lot of pain.  They picked up the oxycodone but the pharmacy told him there needed to be a PA on the other meds.  The husband is wanting to know if there is something else she can take.  [phone]

## 2020-04-08 NOTE — Progress Notes (Signed)
Subjective:    Patient ID: Natasha Curry, female    DOB: 1945/09/18, 74 y.o.   MRN: [id number]  Natasha Curry is a 74 y.o. female presenting on 04/08/2020 for Hospitalization Follow-up (back pain)   HPI    ED FOLLOW-UP VISIT  Hospital/Location: [facility] Date of ED Visit: 04/07/20  Reason for Presenting to ED: Low Back Pain Primary (+Secondary) Diagnosis: Lumbar DJD Spinal Stenosis, Radiculopathy  FOLLOW-UP  - ED provider note and record have been reviewed - Patient presents today about 1 day after recent ED visit. Brief summary of recent course, patient had symptoms of severe low back pain persistent, limited improvement over past 2+ weeks. Reviewed course with 1st visit with me on 03/21/20, dx with Trochanteric bursitis L hip, and treated with oral prednisone as thought it could help back and hip. Also given baclofen muscle relaxant. Limited relief. She was referred to [facility], they saw her on 03/28/20, given her L Hip injection steroid and Prednisone taper and Tramadol PRN for breakthrough pain. - She had some relief on tramadol, but mild, no relief on baclofen, limited on prednisone - Went to hospital on 04/07/20, had CT imaging done L hip and Lumbar spine, see below, showed advanced DJD. Hospital gave her Lidoerm patches, gave pain medicine there, and has now scheduled apt with [facility] spine specialist Dr Sharyne Richters on 8/19  - Today reports overall has still had severe pain since hospital ED visit. Some improved on lidoerm patch did not pick up the rx yet Using ice PRN Still using last of Tramadol but says she did take one of her husband's oxycodone given to her with much better relief. She is still in severe low back L>R pain and has radicular radiating symptom of pain into left lower extremity to knee.  She has PT evaluation 04/09/20  Denies numbness tingling weakness loss of sensation, bowel or bladder incontinence, fever chills  I have reviewed the discharge medication list,  and have reconciled the current and discharge medications today.   Depression screen [facility] 2/9 11/27/2019 04/24/2019  Decreased Interest 0 1  Down, Depressed, Hopeless 0 0  PHQ - 2 Score 0 1  Altered sleeping - 3  Tired, decreased energy - 3  Change in appetite - 3  Feeling bad or failure about yourself  - 0  Trouble concentrating - 0  Moving slowly or fidgety/restless - 0  Suicidal thoughts - 0  PHQ-9 Score - 10    Social History   Tobacco Use   Smoking status: Never Smoker   Smokeless tobacco: Never Used  Vaping Use   Vaping Use: Never used  Substance Use Topics   Alcohol use: Yes    Alcohol/week: 3.0 standard drinks    Types: 3 Standard drinks or equivalent per week   Drug use: Never    Review of Systems Per HPI unless specifically indicated above     Objective:    BP (!) 146/63    Pulse 84    Temp (!) 97.1 F (36.2 C) (Temporal)    Resp 16    Ht 5\' 3"  (1.6 m)    Wt 273 lb 9.6 oz (124.1 kg)    BMI 48.47 kg/m   Wt Readings from Last 3 Encounters:  04/08/20 273 lb 9.6 oz (124.1 kg)  04/07/20 (!) 280 lb (127 kg)  03/21/20 280 lb 9.6 oz (127.3 kg)    Physical Exam Vitals and nursing note reviewed.  Constitutional:      General: She is  not in acute distress.    Appearance: She is well-developed. She is obese. She is not diaphoretic.     Comments: Well-appearing, comfortable, cooperative  HENT:     Head: Normocephalic and atraumatic.  Eyes:     General:        Right eye: No discharge.        Left eye: No discharge.     Conjunctiva/sclera: Conjunctivae normal.  Cardiovascular:     Rate and Rhythm: Normal rate.  Pulmonary:     Effort: Pulmonary effort is normal.  Musculoskeletal:     Comments: Patient is in acute pain, prefers to stand. Able to ambulate.  Skin:    General: Skin is warm and dry.     Findings: No erythema or rash.  Neurological:     General: No focal deficit present.     Mental Status: She is alert and oriented to person, place, and  time.     Sensory: No sensory deficit.  Psychiatric:        Behavior: Behavior normal.     Comments: Well groomed, good eye contact, normal speech and thoughts      I have personally reviewed the radiology report from CT Left Hip and Lumbar Spine 04/07/20.   CT Hip Left Wo ContrastPerformed 04/07/2020 Final result  Study Result CLINICAL DATA: Left hip pain. No known injury.  EXAM: CT OF THE LEFT HIP WITHOUT CONTRAST  TECHNIQUE: Multidetector CT imaging of the left hip was performed according to the standard protocol. Multiplanar CT image reconstructions were also generated.  COMPARISON: None.  FINDINGS: Bones/Joint/Cartilage  There is no acute bony or joint abnormality. No subchondral sclerosis or cyst formation about the left hip. Minimal osteophytosis along the periphery of the acetabulum is noted. Left hip joint space is preserved. No worrisome bony lesion is seen. 0.6 cm bone island in the femoral head is noted. There is mild osteophytosis about the symphysis pubis. No hip joint effusion.  Ligaments  Suboptimally assessed by CT.  Muscles and Tendons  Intact and normal in appearance.  Soft tissues  Negative.  IMPRESSION: Very mild appearing left hip osteoarthritis and mild degenerative disease at the symphysis pubis. The exam is otherwise negative.   Electronically Signed By: Inge Rise M.D. On: 04/07/2020 11:55   ---------------------------------------------   CT Lumbar Spine Wo ContrastPerformed 04/07/2020 Final result  Study Result CLINICAL DATA: 74 year old female with 3 weeks of pain radiating to the left hip and leg.  EXAM: CT LUMBAR SPINE WITHOUT CONTRAST  TECHNIQUE: Multidetector CT imaging of the lumbar spine was performed without intravenous contrast administration. Multiplanar CT image reconstructions were also generated.  COMPARISON: Left hip CT this morning reported separately.  FINDINGS: Segmentation: Normal.  Alignment:  Straightening of lumbar lordosis. Slight dextroconvex lumbar scoliosis. No spondylolisthesis.  Vertebrae: Osteopenia. No acute osseous abnormality identified. Intact visible sacrum and SI joints.  Paraspinal and other soft tissues: Partially visible exophytic but simple fluid density mass of the left renal midpole, likely a simple cyst. Superimposed left lower pole nephrolithiasis. Partially visible probable benign right renal parapelvic cyst. Aortoiliac calcified atherosclerosis. Negative lumbar paraspinal soft tissues.  Disc levels:  T12-L1: Negative.  L1-L2: Negative.  L2-L3: Moderate to severe disc space loss with vacuum disc and circumferential disc osteophyte complex. Mild spinal stenosis.  L3-L4: Severe disc space loss with vacuum disc and circumferential disc osteophyte complex. Mild to moderate posterior element hypertrophy. Mild to moderate spinal stenosis and bilateral L3 foraminal stenosis.  L4-L5: Severe disc space loss with  vacuum disc. Circumferential disc osteophyte complex and mild to moderate posterior element hypertrophy. Mild to moderate spinal and left greater than right L4 foraminal stenosis.  L5-S1: Severe disc space loss with vacuum disc. Circumferential disc osteophyte complex. Mild to moderate facet hypertrophy. No spinal stenosis. Moderate bilateral L5 foraminal stenosis.  IMPRESSION: 1. Osteopenia. No acute osseous abnormality in the lumbar spine. 2. Multilevel advanced lumbar disc and endplate degeneration with up to moderate multifactorial spinal and moderate bilateral foraminal stenosis at L3-L4 and L4-L5. 3. Left nephrolithiasis. 4. Aortic Atherosclerosis (ICD10-I70.0).   Electronically Signed By: Genevie Ann M.D. On: 04/07/2020 13:46    Results for orders placed or performed in visit on 11/27/19  POCT HgB A1C  Result Value Ref Range   Hemoglobin A1C 6.2 (A) 4.0 - 5.6 %      Assessment & Plan:   Problem List Items Addressed This  Visit    Degenerative lumbar spinal stenosis - Primary   Relevant Medications   oxyCODONE-acetaminophen (PERCOCET/ROXICET) 5-325 MG tablet   cyclobenzaprine (FLEXERIL) 10 MG tablet    Other Visit Diagnoses    Chronic bilateral low back pain with left-sided sciatica       Relevant Medications   oxyCODONE-acetaminophen (PERCOCET/ROXICET) 5-325 MG tablet   cyclobenzaprine (FLEXERIL) 10 MG tablet   Lumbar radiculopathy       Relevant Medications   oxyCODONE-acetaminophen (PERCOCET/ROXICET) 5-325 MG tablet   cyclobenzaprine (FLEXERIL) 10 MG tablet      Subacute on chronic L>R LBP with associated L sciatica and radicular pain Known advanced Lumbar DJD with spinal stenosis on recent CT see above Likely recent lumbar strain Less likely to be L hip pain, but does have mild OA/DJD in L Hip only - ineffective treatment with injection already and steroids/ treatment of L Hip No prior spine surgery No other red flags. But has persistent  Plan: 1. Stop Tramadol 2. Start Oxycodone-acetaminophen 5/325 q 4 hr PRN max 6 in 24 hours, caution reviewed with patient, short term rx up to 5 day minimum 3. Stop Baclofen - START Flexeril 10mg  TID PRN caution with sedation 4. Use Tylenol for breakthrough up to 1000mg  extra dose per 24 hours, as discussed per AVS 5. Use ice packs PRN 6. Go to PT tomorrow as scheduled per [facility], advised this PT eval is very important, if too much pain to proceed, they need to document that. 7. Keep apt up coming for Dr Sharyne Richters Spine on 8/19  Call us if need refill oxycodone temporary supply by next week, anticipate 5-10 day if possible for it to last, and we can do short term rx until spine specialist evaluation. She understands.  May go back to hospital ED if new change or new problem severe pain  Meds ordered this encounter  Medications   oxyCODONE-acetaminophen (PERCOCET/ROXICET) 5-325 MG tablet    Sig: Take 1 tablet by mouth every 4 (four) hours as needed for  severe pain. Max 6 in 24 hours.    Dispense:  30 tablet    Refill:  0   cyclobenzaprine (FLEXERIL) 10 MG tablet    Sig: Take 1 tablet (10 mg total) by mouth 3 (three) times daily as needed for muscle spasms.    Dispense:  30 tablet    Refill:  0     Follow up plan: Return in about 4 weeks (around 05/06/2020), or if symptoms worsen or fail to improve, for 4 weeks as needed back pain.   Nobie Putnam, [facility] 04/08/2020, 9:47 AM

## 2020-04-08 NOTE — Patient Instructions (Addendum)
Thank you for coming to the office today.  Stop Tramadol. Start Oxycodone-acetaminophen 5/325 - take one every 4 hours as needed for pain. Max 6 in 24 hours. If can skip a dose occasionally that is preferred.  Adding up the Tylenol (acetaminophen dose) if you take 6 pills of the oxycodone that will equal about 2000 tylenol mg in 24 hours.  You may take an additional Tylenol Ext Str 500mg  - TWO PILLS for 1000mg  in 24 hours.  If you skip an Oxycodone, you may take an Extra Tylenol pill  STOP baclofen muscle relaxant  Start Cyclobenzapine (Flexeril) 10mg  tablets (muscle relaxant) - start with half (cut) to one whole pill at night for muscle relaxant - may make you sedated or sleepy (be careful driving or working on this) if tolerated you can take half to whole tab 2 to 3 times daily or every 8 hours as needed  Keep apt with Dr Sharyne Richters  If need refill before apt, then can contact us within 5-7 days from today  [facility]  Keep with ice packs.  Please schedule a Follow-up Appointment to: Return in about 4 weeks (around 05/06/2020), or if symptoms worsen or fail to improve, for 4 weeks as needed back pain.  If you have any other questions or concerns, please feel free to call the office or send a message through [place]. You may also schedule an earlier appointment if necessary.  Additionally, you may be receiving a survey about your experience at our office within a few days to 1 week by e-mail or mail. We value your feedback.  Natasha Putnam, DO [place]

## 2020-04-08 NOTE — Telephone Encounter (Signed)
Patient states she is unable to pick up RX sent into pharmacy today, as pharmacy stated they need approval from her insurance. Patient wanted me to let Dr. Parks Ranger know that she is still in a significant amount of pain and would like to know what he recommends.

## 2020-04-11 ENCOUNTER — Telehealth: Payer: Self-pay

## 2020-04-11 NOTE — Telephone Encounter (Signed)
Patient notified

## 2020-04-11 NOTE — Telephone Encounter (Signed)
Called patient. Left a detailed message for her.  I advised on the message in general terms that unfortunately there is no other alternative for pain, we have tried most every medicine option already, this was already the alternative because the last pain medicine didn't work.  She should contact her Orthopedic and try to get in sooner, or if need may go to hospital ED or Urgent Care if pain is too severe.  Nobie Putnam, [facility] 04/11/2020, 12:50 PM

## 2020-04-11 NOTE — Telephone Encounter (Signed)
Copied from [place] [id number]. Topic: General - Other >> Apr 11, 2020  9:55 AM Rainey Pines A wrote: Patient called to inform Dr. Raliegh Ip that the oxyCODONE-acetaminophen (PERCOCET/ROXICET) 5-325 MG tablet medication is not working for her and if he can send in an alternative medication. Please advise

## 2020-04-14 ENCOUNTER — Other Ambulatory Visit: Payer: Self-pay | Admitting: Family Medicine

## 2020-04-14 DIAGNOSIS — G8929 Other chronic pain: Secondary | ICD-10-CM

## 2020-04-14 DIAGNOSIS — M48061 Spinal stenosis, lumbar region without neurogenic claudication: Secondary | ICD-10-CM

## 2020-04-14 DIAGNOSIS — M5416 Radiculopathy, lumbar region: Secondary | ICD-10-CM

## 2020-04-14 DIAGNOSIS — M5442 Lumbago with sciatica, left side: Secondary | ICD-10-CM

## 2020-04-14 MED ORDER — CYCLOBENZAPRINE HCL 10 MG PO TABS: 10.0000 mg | ORAL_TABLET | Freq: Three times a day (TID) | ORAL | 0 refills | Status: DC | PRN

## 2020-04-14 MED ORDER — OXYCODONE-ACETAMINOPHEN 5-325 MG PO TABS: 1.0000 | ORAL_TABLET | ORAL | 0 refills | Status: DC | PRN

## 2020-04-14 NOTE — Telephone Encounter (Signed)
Requested medication (s) are due for refill today: no  Requested medication (s) are on the active medication list: yes  Last refill:  04/08/2020  Future visit scheduled:no  Notes to clinic:  this refill cannot be delegated    Requested Prescriptions  Pending Prescriptions Disp Refills   cyclobenzaprine (FLEXERIL) 10 MG tablet 30 tablet 0    Sig: Take 1 tablet (10 mg total) by mouth 3 (three) times daily as needed for muscle spasms.      Not Delegated - Analgesics:  Muscle Relaxants Failed - 04/14/2020  9:29 AM      Failed - This refill cannot be delegated      Passed - Valid encounter within last 6 months    Recent Outpatient Visits           6 days ago Degenerative lumbar spinal stenosis   [place], DO   3 weeks ago Trochanteric bursitis of left hip   [place], DO   4 months ago Elevated hemoglobin A1c   [place], DO   6 months ago Acute otitis media, unspecified otitis media type   [place], DO   8 months ago Elevated hemoglobin A1c   [place], DO                oxyCODONE-acetaminophen (PERCOCET/ROXICET) 5-325 MG tablet 30 tablet 0    Sig: Take 1 tablet by mouth every 4 (four) hours as needed for severe pain. Max 6 in 24 hours.      Not Delegated - Analgesics:  Opioid Agonist Combinations Failed - 04/14/2020  9:29 AM      Failed - This refill cannot be delegated      Failed - Urine Drug Screen completed in last 360 days.      Passed - Valid encounter within last 6 months    Recent Outpatient Visits           6 days ago Degenerative lumbar spinal stenosis   [place], DO   3 weeks ago Trochanteric bursitis of left hip   [place], DO   4 months ago Elevated  hemoglobin A1c   [place], DO   6 months ago Acute otitis media, unspecified otitis media type   [place], DO   8 months ago Elevated hemoglobin A1c   [place], Devonne Doughty, [place]

## 2020-04-14 NOTE — Telephone Encounter (Signed)
Medication Refill - Medication: cyclobenzaprine (FLEXERIL) 10 MG tablet ,oxyCODONE-acetaminophen (PERCOCET/ROXICET) 5-325 MG tablet     Has the patient contacted their pharmacy? Yes (Agent: If no, request that the patient contact the pharmacy for the refill.) (Agent: If yes, when and what did the pharmacy advise?)Contact Pcp  Preferred Pharmacy (with phone number or street name):  [id number] - [place], [address] Phone:  [phone]  Fax:  [phone]       Agent: Please be advised that RX refills may take up to 3 business days. We ask that you follow-up with your pharmacy.

## 2020-04-22 ENCOUNTER — Telehealth: Payer: Self-pay

## 2020-04-22 NOTE — Telephone Encounter (Signed)
Patient advised she knew that it might not be cover, also remind her about keeping appointment with Orthopedic spine specialist and she does not want to any home health Nurse referral.

## 2020-04-22 NOTE — Telephone Encounter (Signed)
Copied from [place] [id number]. Topic: Quick Communication - See Telephone Encounter >> Apr 21, 2020  5:41 PM Loma [place] wrote: CRM for notification. See Telephone encounter for: 04/21/20. Pt husband is stating he is in dire need of help, a home heath nurse possibly, PT, wife is home and wetting and soiling herself, screaming if he tryies to get her up to go to the bathroom, and he can not lift her as he has had back surgery. She is not eating for him. He is beside himself, feels he cant do anything right.Marland Kitchen and states he does not have ability to care for her at all. Please FU, states urgent. [phone]

## 2020-04-22 NOTE — Telephone Encounter (Signed)
Unable to reach patient LVM if she calls back please suggest Dr Marthann Schiller recommendation or if you need [place] to reply --then can transfer to [facility].

## 2020-04-22 NOTE — Telephone Encounter (Signed)
She should still have upcoming visit with Orthopedic spine specialist I believe on 04/24/20 that will be a very important apt to keep.  If symptoms are too severe, unfortunately hospital ED is an option - if she needs home health nursing, it is not an immediate thing - we can locate a company and place orders, but usually that may take days to week or more before they can start.  Also please let them know home health is often mostly for Nurse to check on her but not for helping with lifting and going to bathroom.  I do not think she needs Home health Physical Therapy at this time, she needs to see spine specialist first.  That would be a "Home Aide" that is usually NOT covered by medicare. That would be an out of pocket cost or partial cost to patient if they can check with their medicare benefits and locate a provider.  Usually home health is set up faster after hospitalization or after a surgery.  Let me know if they want me to try to order Home Health Nurse / [place] if possible, but again this is limited - they usually do not come out every day and do not help with assistance in the home for hours or longer shifts.  Natasha Curry, [facility] 04/22/2020, 11:06 AM

## 2020-04-22 NOTE — Telephone Encounter (Signed)
Patient's husband is returning a call to the [place].  He would like to discuss the patient's condition.  Please call back at [phone]

## 2020-05-20 ENCOUNTER — Encounter: Payer: Self-pay | Admitting: Family Medicine

## 2020-05-20 ENCOUNTER — Other Ambulatory Visit: Payer: Self-pay | Admitting: Family Medicine

## 2020-05-20 DIAGNOSIS — M48061 Spinal stenosis, lumbar region without neurogenic claudication: Secondary | ICD-10-CM

## 2020-05-20 DIAGNOSIS — M5416 Radiculopathy, lumbar region: Secondary | ICD-10-CM

## 2020-05-20 DIAGNOSIS — G8929 Other chronic pain: Secondary | ICD-10-CM

## 2020-05-20 MED ORDER — CYCLOBENZAPRINE HCL 10 MG PO TABS: 10.0000 mg | ORAL_TABLET | Freq: Three times a day (TID) | ORAL | 1 refills | Status: DC | PRN

## 2020-05-20 NOTE — Telephone Encounter (Signed)
Medication Refill - Medication: cyclobenzaprine (FLEXERIL) 10 MG tablet   Has the patient contacted their pharmacy? No. (Agent: If no, request that the patient contact the pharmacy for the refill.) (Agent: If yes, when and what did the pharmacy advise?)  Preferred Pharmacy (with phone number or street name): [id number] - [place], [address]  [address] [place] [address]  Phone:  [phone] Fax:  [phone]   Agent: Please be advised that RX refills may take up to 3 business days. We ask that you follow-up with your pharmacy.

## 2020-06-17 ENCOUNTER — Other Ambulatory Visit: Payer: Self-pay | Admitting: Family Medicine

## 2020-06-17 DIAGNOSIS — G8929 Other chronic pain: Secondary | ICD-10-CM

## 2020-06-17 DIAGNOSIS — M48061 Spinal stenosis, lumbar region without neurogenic claudication: Secondary | ICD-10-CM

## 2020-06-17 DIAGNOSIS — K219 Gastro-esophageal reflux disease without esophagitis: Secondary | ICD-10-CM

## 2020-06-17 DIAGNOSIS — M5416 Radiculopathy, lumbar region: Secondary | ICD-10-CM

## 2020-06-17 NOTE — Telephone Encounter (Signed)
Requested Prescriptions  Pending Prescriptions Disp Refills   omeprazole (PRILOSEC) 40 MG capsule [Pharmacy Med Name: OMEPRAZOLE DR 40 MG CAPSULE] 90 capsule 1    Sig: TAKE 1 CAPSULE BY MOUTH EVERY DAY     Gastroenterology: Proton Pump Inhibitors Passed - 06/17/2020  1:18 AM      Passed - Valid encounter within last 12 months    Recent Outpatient Visits          2 months ago Degenerative lumbar spinal stenosis   [place], DO   2 months ago Trochanteric bursitis of left hip   [place], DO   6 months ago Elevated hemoglobin A1c   [place], DO   8 months ago Acute otitis media, unspecified otitis media type   [place], DO   10 months ago Elevated hemoglobin A1c   [place], Devonne Doughty, [place]

## 2020-06-17 NOTE — Telephone Encounter (Signed)
Requested medication (s) are due for refill today - unsure   Requested medication (s) are on the active medication list -yes  Future visit scheduled no  Last refill: 05/20/20  Notes to clinic: Request for non delegated Rx  Requested Prescriptions  Pending Prescriptions Disp Refills   cyclobenzaprine (FLEXERIL) 10 MG tablet [Pharmacy Med Name: CYCLOBENZAPRINE 10 MG TABLET] 30 tablet 1    Sig: TAKE 1 TABLET BY MOUTH THREE TIMES A DAY AS NEEDED FOR MUSCLE SPASMS      Not Delegated - Analgesics:  Muscle Relaxants Failed - 06/17/2020  8:47 AM      Failed - This refill cannot be delegated      Passed - Valid encounter within last 6 months    Recent Outpatient Visits           2 months ago Degenerative lumbar spinal stenosis   [place], DO   2 months ago Trochanteric bursitis of left hip   [place], DO   6 months ago Elevated hemoglobin A1c   [place], DO   8 months ago Acute otitis media, unspecified otitis media type   [place], DO   10 months ago Elevated hemoglobin A1c   [place], DO                  Requested Prescriptions  Pending Prescriptions Disp Refills   cyclobenzaprine (FLEXERIL) 10 MG tablet [Pharmacy Med Name: CYCLOBENZAPRINE 10 MG TABLET] 30 tablet 1    Sig: TAKE 1 TABLET BY MOUTH THREE TIMES A DAY AS NEEDED FOR MUSCLE SPASMS      Not Delegated - Analgesics:  Muscle Relaxants Failed - 06/17/2020  8:47 AM      Failed - This refill cannot be delegated      Passed - Valid encounter within last 6 months    Recent Outpatient Visits           2 months ago Degenerative lumbar spinal stenosis   [place], DO   2 months ago Trochanteric bursitis of left hip   [place], DO   6 months ago Elevated hemoglobin A1c   [place], DO   8 months ago Acute otitis media, unspecified otitis media type   [place], DO   10 months ago Elevated hemoglobin A1c   [place], Devonne Doughty, [place]

## 2020-06-17 NOTE — Telephone Encounter (Signed)
Spoke with the pharmacy concerning the Rx cyclobenzaprine (FLEXERIL) 10 MG tablet because of the date pt states she got hers filled. They say they never received th Rx on 05/20/20 with 1 refill.  Pt states she is having back surgery Thurs and really needs this medication.

## 2020-09-01 ENCOUNTER — Ambulatory Visit (INDEPENDENT_AMBULATORY_CARE_PROVIDER_SITE_OTHER): Payer: Medicare Other | Admitting: Family Medicine

## 2020-09-01 ENCOUNTER — Other Ambulatory Visit: Payer: Self-pay

## 2020-09-01 ENCOUNTER — Encounter: Payer: Self-pay | Admitting: Family Medicine

## 2020-09-01 VITALS — BP 156/75 | HR 73 | Temp 97.3°F | Resp 16 | Ht 63.0 in | Wt 281.8 lb

## 2020-09-01 DIAGNOSIS — M5442 Lumbago with sciatica, left side: Secondary | ICD-10-CM

## 2020-09-01 DIAGNOSIS — R591 Generalized enlarged lymph nodes: Secondary | ICD-10-CM

## 2020-09-01 DIAGNOSIS — M48061 Spinal stenosis, lumbar region without neurogenic claudication: Secondary | ICD-10-CM | POA: Diagnosis not present

## 2020-09-01 DIAGNOSIS — Z6841 Body Mass Index (BMI) 40.0 and over, adult: Secondary | ICD-10-CM

## 2020-09-01 DIAGNOSIS — J3089 Other allergic rhinitis: Secondary | ICD-10-CM

## 2020-09-01 DIAGNOSIS — H65112 Acute and subacute allergic otitis media (mucoid) (sanguinous) (serous), left ear: Secondary | ICD-10-CM | POA: Diagnosis not present

## 2020-09-01 DIAGNOSIS — G8929 Other chronic pain: Secondary | ICD-10-CM

## 2020-09-01 MED ORDER — AMOXICILLIN-POT CLAVULANATE 875-125 MG PO TABS: 1.0000 | ORAL_TABLET | Freq: Two times a day (BID) | ORAL | 0 refills | Status: DC

## 2020-09-01 MED ORDER — FLUTICASONE PROPIONATE 50 MCG/ACT NA SUSP: 2.0000 | Freq: Every day | NASAL | 3 refills | Status: DC

## 2020-09-01 NOTE — Progress Notes (Signed)
Subjective:    Patient ID: Natasha Curry, female    DOB: 1946-02-21, 74 y.o.   MRN: [id number]  Natasha Curry is a 74 y.o. female presenting on 09/01/2020 for Eczema (Both ears )   HPI   Sinusitis Allergies Ear, L pain Reports worse problem recently with history of eczema in ear canal, but also has had fullness and pressure and pain L ear. Not having acute sinus infection symptoms but has ear infection symptoms based on report.  Chronic Back pain History of Ruptured Disc Lumbar spinal stenosis Followed by [facility] They have had evaluation and ultimately she was considering surgery On Methocarbamol 750mg  x 2 = 1500mg  4 times a day every 6 hours approximately with Tylenol x 2 per dose with improvement, no longer on other muscle relaxants failed Baclofen, Cyclobenzaprine and no longer on pain medication. They advised her against surgery option  Morbid Obesity BMI >49 Admits difficulty with grazing throughout the day and at night. She says more sedentary.  Lymphadenopathy, Left supraclavicular shoulder region anterior Reports onset 1 week ago, seemed similar timing as when her ear started bothering her, seems to go up and down and unresolved, non tender. No other lymph nodes bothering her   Health Maintenance: UTD COVID booster.  Depression screen [facility] 2/9 11/27/2019 04/24/2019  Decreased Interest 0 1  Down, Depressed, Hopeless 0 0  PHQ - 2 Score 0 1  Altered sleeping - 3  Tired, decreased energy - 3  Change in appetite - 3  Feeling bad or failure about yourself  - 0  Trouble concentrating - 0  Moving slowly or fidgety/restless - 0  Suicidal thoughts - 0  PHQ-9 Score - 10    Social History   Tobacco Use  . Smoking status: Never Smoker  . Smokeless tobacco: Never Used  Vaping Use  . Vaping Use: Never used  Substance Use Topics  . Alcohol use: Yes    Alcohol/week: 3.0 standard drinks    Types: 3 Standard drinks or equivalent per week  . Drug use:  Never    Review of Systems Per HPI unless specifically indicated above     Objective:    BP (!) 156/75   Pulse 73   Temp (!) 97.3 F (36.3 C) (Temporal)   Resp 16   Ht 5\' 3"  (1.6 m)   Wt 281 lb 12.8 oz (127.8 kg)   SpO2 98%   BMI 49.92 kg/m   Wt Readings from Last 3 Encounters:  09/01/20 281 lb 12.8 oz (127.8 kg)  04/08/20 273 lb 9.6 oz (124.1 kg)  04/07/20 (!) 280 lb (127 kg)    Physical Exam Vitals and nursing note reviewed.  Constitutional:      General: She is not in acute distress.    Appearance: She is well-developed and well-nourished. She is not diaphoretic.     Comments: Well-appearing, comfortable, cooperative  HENT:     Head: Normocephalic and atraumatic.     Right Ear: Ear canal and external ear normal. There is no impacted cerumen.     Left Ear: Ear canal and external ear normal. There is no impacted cerumen.     Ears:     Comments: Bilateral ears with some cloudy effusion L>R    Mouth/Throat:     Mouth: Oropharynx is clear and moist.  Eyes:     General:        Right eye: No discharge.        Left eye: No discharge.  Conjunctiva/sclera: Conjunctivae normal.  Cardiovascular:     Rate and Rhythm: Normal rate.  Pulmonary:     Effort: Pulmonary effort is normal.  Musculoskeletal:        General: No edema.  Skin:    General: Skin is warm and dry.     Findings: No erythema or rash.  Neurological:     Mental Status: She is alert and oriented to person, place, and time.  Psychiatric:        Mood and Affect: Mood and affect normal.        Behavior: Behavior normal.     Comments: Well groomed, good eye contact, normal speech and thoughts      Results for orders placed or performed in visit on 11/27/19  POCT HgB A1C  Result Value Ref Range   Hemoglobin A1C 6.2 (A) 4.0 - 5.6 %      Assessment & Plan:   Problem List Items Addressed This Visit    Morbid obesity with BMI of 45.0-49.9, adult (HCC)   Degenerative lumbar spinal stenosis   Relevant  Medications   methocarbamol (ROBAXIN) 750 MG tablet    Other Visit Diagnoses    Chronic bilateral low back pain with left-sided sciatica    -  Primary   Relevant Medications   methocarbamol (ROBAXIN) 750 MG tablet   Acute mucoid otitis media of left ear       Relevant Medications   fluticasone (FLONASE) 50 MCG/ACT nasal spray   amoxicillin-clavulanate (AUGMENTIN) 875-125 MG tablet   Lymphadenopathy of head and neck       Seasonal allergic rhinitis due to other allergic trigger       Relevant Medications   fluticasone (FLONASE) 50 MCG/ACT nasal spray      #Allergic rhinitis / mucoid vs purulent AOM effusion L side Lymphadenopathy  Clinically without active sinusitis, but has concern for developing L sided AOM, based on clinical history and also lymphadenopathy with localized Left anterior upper extremity site. - Not having fevers, she is vaccinated against COVID, no new illness Trial on Augmentin for possible developing AOM Start nasal steroid Flonase 2 sprays in each nostril daily for 4-6 weeks, may repeat course seasonally or as needed Monitor LAD for 4-6 weeks if unresolved, can call or return and will consider localized US imaging - may need to contact radiology to determine appropriate order, as this particular location is not necessarily in neck.  #Chronic back pain Followed by [facility] Continues on Methocarbamol / Tylenol regimen as prescribed.   Meds ordered this encounter  Medications  . fluticasone (FLONASE) 50 MCG/ACT nasal spray    Sig: Place 2 sprays into both nostrils daily. Use for 4-6 weeks then stop and use seasonally or as needed.    Dispense:  16 g    Refill:  3  . amoxicillin-clavulanate (AUGMENTIN) 875-125 MG tablet    Sig: Take 1 tablet by mouth 2 (two) times daily. For 10 days    Dispense:  20 tablet    Refill:  0      Follow up plan: Return if symptoms worsen or fail to improve, for 4=6 weeks lymph node if not  resolved.   Natasha Pilar, DO [facility] 09/01/2020, 2:55 PM

## 2020-09-01 NOTE — Patient Instructions (Addendum)
Thank you for coming to the office today.  Start Augmentin antibiotic 10 day course  Start nasal steroid Flonase 2 sprays in each nostril daily for 4-6 weeks, may repeat course seasonally or as needed  Lymph node - If it does not resolved by 4-6 weeks we can order an Ultrasound, let me know on message / phone call.  Please schedule a Follow-up Appointment to: Return if symptoms worsen or fail to improve, for 4=6 weeks lymph node if not resolved.  If you have any other questions or concerns, please feel free to call the office or send a message through [facility]. You may also schedule an earlier appointment if necessary.  Additionally, you may be receiving a survey about your experience at our office within a few days to 1 week by e-mail or mail. We value your feedback.  Nobie Putnam, DO [place]

## 2020-09-02 DIAGNOSIS — L308 Other specified dermatitis: Secondary | ICD-10-CM

## 2020-09-02 MED ORDER — TRIAMCINOLONE ACETONIDE 0.5 % EX CREA: 1.0000 "application " | TOPICAL_CREAM | Freq: Two times a day (BID) | CUTANEOUS | 2 refills | Status: DC

## 2020-09-25 ENCOUNTER — Other Ambulatory Visit: Payer: Self-pay | Admitting: Family Medicine

## 2020-09-25 DIAGNOSIS — E782 Mixed hyperlipidemia: Secondary | ICD-10-CM

## 2020-09-25 NOTE — Telephone Encounter (Signed)
Requested medications are due for refill today yes  Requested medications are on the active medication list yes  Last refill 07/02/20  Last visit 11/2019  Future visit scheduled no  Notes to clinic Failed protocol due to no valid labs within 360 days.

## 2020-09-27 ENCOUNTER — Other Ambulatory Visit: Payer: Self-pay | Admitting: Family Medicine

## 2020-09-27 DIAGNOSIS — F411 Generalized anxiety disorder: Secondary | ICD-10-CM

## 2020-09-29 ENCOUNTER — Telehealth: Payer: Self-pay

## 2020-09-29 DIAGNOSIS — R591 Generalized enlarged lymph nodes: Secondary | ICD-10-CM

## 2020-09-29 NOTE — Telephone Encounter (Signed)
Referral team has been notified of ultrasound request

## 2020-09-29 NOTE — Telephone Encounter (Signed)
Copied from [place] [id number]. Topic: General - Other >> Sep 26, 2020  4:10 PM Leward Quan A wrote: Reason for CRM: Patient called in to inquire of Dr Raliegh Ip what is the next step with the lump say its been 4 weeks and it have not gone. Please advise Ph# [phone]

## 2020-09-29 NOTE — Telephone Encounter (Signed)
Please notify patient that I have placed an Ultrasound order for [facility].  [place] Address: [address] [place], [place], [place] [address] Phone: [phone]  Raquel Sarna, can you notify referral team to help schedule the Ultrasound imaging order.  Patient should be notified by imaging center once the test is scheduled.  Anticipate to hear back results from [place] within 3 days of her test and we can send results to her mychart with information or call them if question.  Nobie Putnam, [facility] 09/29/2020, 1:42 PM

## 2020-10-06 ENCOUNTER — Other Ambulatory Visit: Payer: Self-pay

## 2020-10-06 ENCOUNTER — Ambulatory Visit
Admission: RE | Admit: 2020-10-06 | Discharge: 2020-10-06 | Disposition: A | Discharge status: Home / Self Care | Payer: Medicare Other | Source: Ambulatory Visit | Attending: Family Medicine | Admitting: Family Medicine

## 2020-10-06 DIAGNOSIS — R591 Generalized enlarged lymph nodes: Secondary | ICD-10-CM | POA: Diagnosis not present

## 2020-10-07 ENCOUNTER — Telehealth: Payer: Self-pay | Admitting: Family Medicine

## 2020-10-07 ENCOUNTER — Telehealth: Payer: Self-pay | Admitting: *Deleted

## 2020-10-07 DIAGNOSIS — R591 Generalized enlarged lymph nodes: Secondary | ICD-10-CM

## 2020-10-07 NOTE — Telephone Encounter (Signed)
-----   Message from Wilson Singer, [place] sent at 10/07/2020  2:17 PM EST -----  The pt would like to proceed with the referral to see the surgeon to discuss possible biopsy.

## 2020-10-07 NOTE — Telephone Encounter (Signed)
Natasha Curry: [facility]  Calling to alert provider that results are in chart with further recommendations: IMPRESSION: 3.4 x 1.0 x 3.5 cm ovoid soft tissue focus in the left supraclavicular region of concern, as described and with nonspecific sonographic features. This finding is indeterminate, but potential considerations include a mass or an abnormally enlarged lymph node. Contrast-enhanced neck CT is recommended for further evaluation.

## 2020-10-09 ENCOUNTER — Encounter: Payer: Self-pay | Admitting: Surgery

## 2020-10-09 ENCOUNTER — Other Ambulatory Visit: Payer: Self-pay

## 2020-10-09 ENCOUNTER — Telehealth: Payer: Self-pay

## 2020-10-09 ENCOUNTER — Ambulatory Visit (INDEPENDENT_AMBULATORY_CARE_PROVIDER_SITE_OTHER): Payer: Medicare Other | Admitting: Surgery

## 2020-10-09 VITALS — BP 169/96 | HR 91 | Temp 98.7°F | Ht 63.0 in | Wt 283.2 lb

## 2020-10-09 DIAGNOSIS — M25512 Pain in left shoulder: Secondary | ICD-10-CM

## 2020-10-09 DIAGNOSIS — R222 Localized swelling, mass and lump, trunk: Secondary | ICD-10-CM | POA: Diagnosis not present

## 2020-10-09 NOTE — Telephone Encounter (Signed)
Spoke with Melissa at [facility] [employer] /reports are needed prior to scheduling DX bilateral mammogram-   MRI shoulder scheduled 10/21/20@ 10:45 @ [facility]-please enter through [place] and register.   Explained to patient that outside images are needed before mammogram can be ordered. Patient instructed to go to [facility] today to sign release to have images sent to [facility] patient stated she would go around 1 pm today.  Will need to follow up with Dr.Rodenberg after mammogram is done.

## 2020-10-09 NOTE — Patient Instructions (Addendum)
We will schedule your Mammogram and call you later today with the appointment information. WE will also order an MRI left shoulder region.

## 2020-10-09 NOTE — Progress Notes (Signed)
Patient ID: Natasha Curry, female   DOB: 02/22/1946, 75 y.o.   MRN: [id number]  Chief Complaint: Left anterior shoulder mass for 4 weeks  History of Present Illness Natasha Curry is a 75 y.o. female with soft tissue mass of left shoulder for 4 weeks, apparently getting bigger.  She reports its been present for much longer duration however it has waxed and waned.  She complains of cold intolerance of the left shoulder with some pain on range of motion.  She reports missing her last mammogram secondary to her move from [place].  She denies any history of nausea, vomiting, fever, chills, dysphagia or odynophagia.  She does admit to chronic fatigue.  She underwent a recent ultrasound of the left lipomatous mass, this was as noted below.  Past Medical History Past Medical History:  Diagnosis Date  . Allergy   . Anxiety   . 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 Colon polyp   . Depression   . Eczema   . GERD (gastroesophageal reflux disease)   . Kidney stones   . Psoriasis   . Sleep apnea   . Tremor       Past Surgical History:  Procedure Laterality Date  . CHOLECYSTECTOMY  2000  . GASTRIC BYPASS  2013  . TOTAL KNEE ARTHROPLASTY  2014    No Known Allergies  Current Outpatient Medications  Medication Sig Dispense Refill  . atorvastatin (LIPITOR) 10 MG tablet TAKE 1 TABLET BY MOUTH EVERY DAY 90 tablet 0  . escitalopram (LEXAPRO) 20 MG tablet TAKE 1 TABLET BY MOUTH EVERY DAY 90 tablet 0  . fluticasone (FLONASE) 50 MCG/ACT nasal spray Place 2 sprays into both nostrils daily. Use for 4-6 weeks then stop and use seasonally or as needed. 16 g 3  . lidocaine (LIDODERM) 5 % Place 1 patch onto the skin every 12 (twelve) hours. Remove & Discard patch within 12 hours or as directed by MD 10 patch 0  . methocarbamol (ROBAXIN) 750 MG tablet Take 2 tablets (1,500 mg total) by mouth every 6 (six) hours as needed for muscle spasms.    . metoprolol succinate (TOPROL-XL) 25 MG 24 hr tablet Take 0.5 tablets  (12.5 mg total) by mouth daily. 45 tablet 1  . omeprazole (PRILOSEC) 40 MG capsule TAKE 1 CAPSULE BY MOUTH EVERY DAY 90 capsule 3  . triamcinolone cream (KENALOG) 0.5 % Apply 1 application topically 2 (two) times daily. To affected areas, for up to 2 weeks. 30 g 2   No current facility-administered medications for this visit.    Family History Family History  Problem Relation Age of Onset  . Cancer Mother        adrenal  . Heart attack Father   . Heart disease Father   . Stroke Father       Social History Social History   Tobacco Use  . Smoking status: Never Smoker  . Smokeless tobacco: Never Used  Vaping Use  . Vaping Use: Never used  Substance Use Topics  . Alcohol use: Yes    Alcohol/week: 3.0 standard drinks    Types: 3 Standard drinks or equivalent per week  . Drug use: Never        Review of Systems  Constitutional: Positive for malaise/fatigue.  HENT: Positive for hearing loss.   Eyes: Negative.   Respiratory: Positive for shortness of breath.   Cardiovascular: Negative.   Gastrointestinal: Positive for heartburn.  Genitourinary: Negative.   Skin: Negative.   Neurological: Negative.  Psychiatric/Behavioral: Negative.       Physical Exam Blood pressure (!) 169/96, pulse 91, temperature 98.7 F (37.1 C), temperature source Oral, height 5\' 3"  (1.6 m), weight 283 lb 3.2 oz (128.5 kg), SpO2 96 %. Last Weight  Most recent update: 10/09/2020 10:00 AM   Weight  128.5 kg (283 lb 3.2 oz)            CONSTITUTIONAL: Well developed, and nourished, morbidly obese female, appropriately responsive and aware without distress.   EYES: Sclera non-icteric.   EARS, NOSE, MOUTH AND THROAT: Mask worn.   The oropharynx is clear. Oral mucosa is pink and moist.   Do not appreciate any obvious oropharyngeal masses or lesions.  Hearing is intact to voice.  NECK: Trachea is midline, and there is no jugular venous distension.   I cannot palpate her thyroid gland. LYMPH NODES:   Lymph nodes in the neck are not enlarged.  There are no supraclavicular lymphadenopathy as well.   There is a diffuse flat irregular soft tissue mass and subcutaneous tissue planes overlying the anterior left shoulder.  This is all anterior to her left clavicle.  These are not supraclavicular tissue masses. RESPIRATORY:  Lungs are clear, and breath sounds are equal bilaterally. Normal respiratory effort without pathologic use of accessory muscles. CARDIOVASCULAR: Heart is regular in rate and rhythm. GI: The abdomen is morbidly obese, right upper quadrant subcostal scar, otherwise soft, nontender, and nondistended. There were no palpable masses. I did not appreciate hepatosplenomegaly. There were normal bowel sounds. GU: Breast exam is notable for a tender region on the left breast at the junction of the pectoralis major, there is a vague density in that area.  Otherwise there are no other suspicious or dominant masses or nodularity.  No skin changes appreciated. MUSCULOSKELETAL:  Symmetrical muscle tone appreciated in all four extremities.    SKIN: Skin turgor is normal. No pathologic skin lesions appreciated.  NEUROLOGIC:  Motor and sensation appear grossly normal.  Cranial nerves are grossly without defect. PSYCH:  Alert and oriented to person, place and time. Affect is appropriate for situation.  Data Reviewed I have personally reviewed what is currently available of the patient's imaging, recent labs and medical records.   Labs:  CBC Latest Ref Rng & Units 07/19/2019  WBC 3.8 - 10.8 Thousand/uL 5.5  Hemoglobin 11.7 - 15.5 g/dL 13.0  Hematocrit 35.0 - 45.0 % 40.0  Platelets 140 - 400 Thousand/uL 222   CMP Latest Ref Rng & Units 07/19/2019  Glucose 65 - 99 mg/dL 133(H)  BUN 7 - 25 mg/dL 17  Creatinine 0.60 - 0.93 mg/dL 0.88  Sodium 135 - 146 mmol/L 140  Potassium 3.5 - 5.3 mmol/L 4.3  Chloride 98 - 110 mmol/L 107  CO2 20 - 32 mmol/L 25  Calcium 8.6 - 10.4 mg/dL 9.0  Total Protein 6.1 -  8.1 g/dL 6.2  Total Bilirubin 0.2 - 1.2 mg/dL 0.6  AST 10 - 35 U/L 11  ALT 6 - 29 U/L 11      Imaging:  CLINICAL DATA:  Lymphadenopathy of head and neck. Left supraclavicular nodule versus lymph node, episodic recurrence, greater than 4-6 weeks.  EXAM: ULTRASOUND OF HEAD/NECK SOFT TISSUES  TECHNIQUE: Ultrasound examination of the head and neck soft tissues was performed in the area of clinical concern.  COMPARISON:  No pertinent prior exams available for comparison.  FINDINGS: Targeted ultrasound was performed in the left supraclavicular region of concern. At this site, there is an ovoid soft tissue  focus which demonstrates subtly increased echogenicity as compared to adjacent musculature and slightly heterogeneous echotexture. This measures 3.4 x 1.0 x 3.5 cm.  These results will be called to the ordering clinician or representative by the Radiologist Assistant, and communication documented in the PACS or [employer].  IMPRESSION: 3.4 x 1.0 x 3.5 cm ovoid soft tissue focus in the left supraclavicular region of concern, as described and with nonspecific sonographic features. This finding is indeterminate, but potential considerations include a mass or an abnormally enlarged lymph node. Contrast-enhanced neck CT is recommended for further evaluation.   Electronically Signed   By: Kellie Simmering DO   On: 10/06/2020 16:50  Within last 24 hrs: No results found.  Assessment    Vague soft tissue mass anterior to the left shoulder, likely lipomatous in nature. She is delinquent with her breast screening examinations. She complains of left shoulder pain. Patient Active Problem List   Diagnosis Date Noted  . Degenerative lumbar spinal stenosis 04/08/2020  . Essential hypertension 09/25/2019  . Elevated hemoglobin A1c 07/26/2019  . 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 OSA on CPAP 04/24/2019   . Bilateral lower extremity edema 04/24/2019  . Morbid obesity with BMI of 45.0-49.9, adult ([place]) 04/24/2019  . Mixed hyperlipidemia 04/24/2019  . GAD (generalized anxiety disorder) 04/24/2019    Plan    We will obtain screening/diagnostic mammography, hopefully she will be able to obtain her prior images for comparison. Soft tissue MRI of the left shoulder. Follow-up after the above.  Face-to-face time spent with the patient and accompanying care providers(if present) was 45 minutes, with more than 50% of the time spent counseling, educating, and coordinating care of the patient.      Ronny Bacon M.D., FACS 10/09/2020, 10:35 AM

## 2020-10-20 ENCOUNTER — Telehealth: Payer: Self-pay

## 2020-10-20 NOTE — Telephone Encounter (Signed)
Spoke with Roselyn Reef at [facility]-no outside images have been received- She will follow up with Anderson [facility] to check status.

## 2020-10-21 ENCOUNTER — Other Ambulatory Visit: Payer: Self-pay | Admitting: Family Medicine

## 2020-10-21 ENCOUNTER — Ambulatory Visit
Admission: RE | Admit: 2020-10-21 | Discharge: 2020-10-21 | Disposition: A | Discharge status: Home / Self Care | Payer: Medicare Other | Source: Ambulatory Visit | Attending: Surgery | Admitting: Surgery

## 2020-10-21 ENCOUNTER — Other Ambulatory Visit: Payer: Self-pay

## 2020-10-21 DIAGNOSIS — M25512 Pain in left shoulder: Secondary | ICD-10-CM | POA: Diagnosis not present

## 2020-10-21 DIAGNOSIS — Z1231 Encounter for screening mammogram for malignant neoplasm of breast: Secondary | ICD-10-CM

## 2020-10-21 MED ORDER — GADOBUTROL 1 MMOL/ML IV SOLN
10.0000 mL | Freq: Once | INTRAVENOUS | Status: AC | PRN
  Administered 2020-10-21: 10 mL via INTRAVENOUS

## 2020-10-23 ENCOUNTER — Other Ambulatory Visit: Payer: Self-pay | Admitting: Family Medicine

## 2020-10-23 ENCOUNTER — Telehealth: Payer: Self-pay

## 2020-10-23 DIAGNOSIS — M67912 Unspecified disorder of synovium and tendon, left shoulder: Secondary | ICD-10-CM

## 2020-10-23 NOTE — Telephone Encounter (Signed)
Left message with [place] Poli to see if outside images have been received so we can move forward with ordering mammogram.

## 2020-11-05 ENCOUNTER — Ambulatory Visit
Admission: RE | Admit: 2020-11-05 | Discharge: 2020-11-05 | Disposition: A | Discharge status: Home / Self Care | Payer: Medicare Other | Source: Ambulatory Visit | Attending: Family Medicine | Admitting: Family Medicine

## 2020-11-05 ENCOUNTER — Other Ambulatory Visit: Payer: Self-pay

## 2020-11-05 DIAGNOSIS — Z1231 Encounter for screening mammogram for malignant neoplasm of breast: Secondary | ICD-10-CM

## 2020-11-08 ENCOUNTER — Other Ambulatory Visit: Payer: Self-pay | Admitting: Family Medicine

## 2020-11-08 DIAGNOSIS — I10 Essential (primary) hypertension: Secondary | ICD-10-CM

## 2020-11-08 NOTE — Telephone Encounter (Signed)
Requested Prescriptions  Pending Prescriptions Disp Refills  . metoprolol succinate (TOPROL-XL) 25 MG 24 hr tablet [Pharmacy Med Name: METOPROLOL SUCC ER 25 MG TAB] 45 tablet 1    Sig: TAKE 1/2 TABLET BY MOUTH EVERY DAY     Cardiovascular:  Beta Blockers Failed - 11/08/2020  8:57 AM      Failed - Last BP in normal range    BP Readings from Last 1 Encounters:  10/09/20 (!) 169/96         Passed - Last Heart Rate in normal range    Pulse Readings from Last 1 Encounters:  10/09/20 91         Passed - Valid encounter within last 6 months    Recent Outpatient Visits          2 months ago Chronic bilateral low back pain with left-sided sciatica   [place], DO   7 months ago Degenerative lumbar spinal stenosis   [place], DO   7 months ago Trochanteric bursitis of left hip   [place], DO   11 months ago Elevated hemoglobin A1c   [place], DO   1 year ago Acute otitis media, unspecified otitis media type   [place], Devonne Doughty, DO

## 2020-11-11 ENCOUNTER — Ambulatory Visit (INDEPENDENT_AMBULATORY_CARE_PROVIDER_SITE_OTHER): Payer: Medicare Other | Admitting: Surgery

## 2020-11-11 ENCOUNTER — Encounter: Payer: Self-pay | Admitting: Surgery

## 2020-11-11 ENCOUNTER — Other Ambulatory Visit: Payer: Self-pay

## 2020-11-11 VITALS — BP 195/83 | HR 71 | Temp 97.0°F | Ht 63.0 in | Wt 283.0 lb

## 2020-11-11 DIAGNOSIS — R222 Localized swelling, mass and lump, trunk: Secondary | ICD-10-CM

## 2020-11-11 NOTE — Progress Notes (Signed)
Surgical Clinic Progress/Follow-up Note   HPI:  75 y.o. Female presents to clinic for  follow-up after her imaging of the left shoulder MRI, and screening mammography.  Denies breast issues, shoulder about the same.   Review of Systems:  Constitutional: denies fever/chills  ENT: denies sore throat, hearing problems  Respiratory: denies shortness of breath, wheezing  Cardiovascular: denies chest pain, palpitations  Gastrointestinal: denies abdominal pain, N/V, or diarrhea Skin: Denies any other rashes or skin discolorations  Vital Signs:  BP (!) 195/83   Pulse 71   Temp (!) 97 F (36.1 C)   Ht 5\' 3"  (1.6 m)   Wt 283 lb (128.4 kg)   SpO2 97%   BMI 50.13 kg/m    Physical Exam:  Constitutional:  -- Obese body habitus  -- Awake, alert, and oriented x3  Pulmonary:  -- No crackles -- Equal breath sounds bilaterally -- Breathing non-labored at rest Cardiovascular:  -- S1, S2 present  -- No pericardial rubs  Gastrointestinal:  -- Soft and non-distended, non-tender. GU  -- Breast exam is unremarkable, no suspicious nodularity, skin changes, or masses or tenderness noted.  Caryl-lynn present as chaperone.   Musculoskeletal / Integumentary:  -- Wounds or skin discoloration: None appreciated ) -- Extremities: hands and feet warm, no edema, sub-q adiposity left shoulder area.   Laboratory studies: none pertinent.   Imaging:  ADDENDUM REPORT: 10/22/2020 09:17  ADDENDUM: Moderate tendinosis of the intra-articular portion of the long head of the biceps tendon.   Electronically Signed   By: Kathreen Devoid   On: 10/22/2020 09:17   Addended by Jennette Banker, MD on 10/22/2020 9:19 AM    Study Result  Narrative & Impression  CLINICAL DATA:  Left shoulder pain worse when laying on it at night. Pt states pain can radiate around and down chest. Pt also has mass on shoulder anteriorly  EXAM: MRI OF THE LEFT SHOULDER WITHOUT AND WITH CONTRAST  TECHNIQUE: Multiplanar,  multisequence MR imaging of the left shoulder was performed before and after the administration of intravenous contrast.  CONTRAST:  63mL GADAVIST GADOBUTROL 1 MMOL/ML IV SOLN  COMPARISON:  None.  FINDINGS: Rotator cuff: Severe tendinosis of the supraspinatus tendon with a 9 mm full-thickness tear anteriorly. Moderate tendinosis of the infraspinatus tendon. Teres minor tendon is intact. Moderate tendinosis of the subscapularis tendon.  Muscles: No muscle atrophy or edema. No intramuscular fluid collection or hematoma.  Biceps Long Head: Intraarticular and extraarticular portions of the biceps tendon are intact.  Acromioclavicular Joint: Moderate arthropathy of the acromioclavicular joint. Type II acromion. Small amount of subacromial/subdeltoid bursal fluid.  Glenohumeral Joint: Small joint effusion. Partial-thickness cartilage loss of the glenohumeral joint.  Labrum: Posterosuperior labral tear.  Bones: No fracture or dislocation. No aggressive osseous lesion.  Other: No soft tissue mass, fluid collection or hematoma. Prominent lobules of subcutaneous fat at the site of clinical palpable abnormality in the superior anterior aspect of the shoulder without a definable encapsulated mass.  IMPRESSION: 1. Severe tendinosis of the supraspinatus tendon with a 9 mm full-thickness tear anteriorly. 2. Moderate tendinosis of the infraspinatus tendon. 3. Moderate tendinosis of the subscapularis tendon. 4. Mild osteoarthritis of the glenohumeral joint. 5. Prominent lobules of subcutaneous fat at the site of clinical palpable abnormality in the superior anterior aspect of the shoulder without a definable encapsulated mass.  Electronically Signed: By: Kathreen Devoid On: 10/22/2020 09:02    EXAM: DIGITAL SCREENING BILATERAL MAMMOGRAM WITH TOMOSYNTHESIS AND CAD  TECHNIQUE: Bilateral screening digital  craniocaudal and mediolateral oblique mammograms were obtained.  Bilateral screening digital breast tomosynthesis was performed. The images were evaluated with computer-aided detection.  COMPARISON:  Previous exam(s).  ACR Breast Density Category b: There are scattered areas of fibroglandular density.  FINDINGS: There are no findings suspicious for malignancy. The images were evaluated with computer-aided detection.  IMPRESSION: No mammographic evidence of malignancy. A result letter of this screening mammogram will be mailed directly to the patient.  RECOMMENDATION: Screening mammogram in one year. (Code:[id number]-B-01Y)  BI-RADS CATEGORY  1: Negative.   Electronically Signed   By: Audie Pinto M.D.   On: 11/07/2020 16:08   Assessment:  75 y.o. yo Female with a problem list including...  Patient Active Problem List   Diagnosis Date Noted  . Supraclavicular mass 10/09/2020  . Degenerative lumbar spinal stenosis 04/08/2020  . Essential hypertension 09/25/2019  . Elevated hemoglobin A1c 07/26/2019  . 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 OSA on CPAP 04/24/2019  . Bilateral lower extremity edema 04/24/2019  . Morbid obesity with BMI of 45.0-49.9, adult ([place]) 04/24/2019  . Mixed hyperlipidemia 04/24/2019  . GAD (generalized anxiety disorder) 04/24/2019    presents to clinic for follow-up evaluation of subcutaneous adipose left shoulder area, progressing well.  Plan:              - return to clinic as needed, instructed to call office if any questions or concerns  All of the above recommendations were discussed with the patient, and all of patient's questions were answered to her expressed satisfaction.  Ronny Bacon, MD, FACS [place]: [facility] exceptional care. Office: [phone]

## 2020-11-11 NOTE — Patient Instructions (Signed)
Follow-up with our office as needed.  Please call and ask to speak with a nurse if you develop questions or concerns.   Continue self breast exams. Call office for any new breast issues or concerns.

## 2020-11-23 ENCOUNTER — Other Ambulatory Visit: Payer: Self-pay | Admitting: Family Medicine

## 2020-11-23 DIAGNOSIS — H65112 Acute and subacute allergic otitis media (mucoid) (sanguinous) (serous), left ear: Secondary | ICD-10-CM

## 2020-11-23 DIAGNOSIS — J3089 Other allergic rhinitis: Secondary | ICD-10-CM

## 2020-11-23 NOTE — Telephone Encounter (Signed)
Requested Prescriptions  Pending Prescriptions Disp Refills  . fluticasone (FLONASE) 50 MCG/ACT nasal spray [Pharmacy Med Name: FLUTICASONE PROP 50 MCG SPRAY] 48 mL 1    Sig: PLACE 2 SPRAYS INTO BOTH NOSTRILS DAILY. USE FOR 4-6 WEEKS THEN STOP AND USE SEASONALLY OR AS NEEDED.     Ear, Nose, and Throat: Nasal Preparations - Corticosteroids Passed - 11/23/2020  9:07 AM      Passed - Valid encounter within last 12 months    Recent Outpatient Visits          2 months ago Chronic bilateral low back pain with left-sided sciatica   [place], DO   7 months ago Degenerative lumbar spinal stenosis   [place], DO   8 months ago Trochanteric bursitis of left hip   [place], DO   12 months ago Elevated hemoglobin A1c   [place], DO   1 year ago Acute otitis media, unspecified otitis media type   [place], Devonne Doughty, DO

## 2020-12-24 ENCOUNTER — Other Ambulatory Visit: Payer: Self-pay | Admitting: Family Medicine

## 2020-12-24 DIAGNOSIS — F411 Generalized anxiety disorder: Secondary | ICD-10-CM

## 2020-12-24 NOTE — Telephone Encounter (Signed)
Requested Prescriptions  Pending Prescriptions Disp Refills  . escitalopram (LEXAPRO) 20 MG tablet [Pharmacy Med Name: ESCITALOPRAM 20 MG TABLET] 90 tablet 0    Sig: TAKE 1 TABLET BY MOUTH EVERY DAY     Psychiatry:  Antidepressants - SSRI Passed - 12/24/2020  1:25 AM      Passed - Valid encounter within last 6 months    Recent Outpatient Visits          3 months ago Chronic bilateral low back pain with left-sided sciatica   [place], DO   8 months ago Degenerative lumbar spinal stenosis   [place], DO   9 months ago Trochanteric bursitis of left hip   [place], DO   1 year ago Elevated hemoglobin A1c   [place], DO   1 year ago Acute otitis media, unspecified otitis media type   [place], Devonne Doughty, DO

## 2020-12-25 ENCOUNTER — Other Ambulatory Visit: Payer: Self-pay | Admitting: Family Medicine

## 2020-12-25 DIAGNOSIS — E782 Mixed hyperlipidemia: Secondary | ICD-10-CM

## 2020-12-25 NOTE — Telephone Encounter (Signed)
Requested Prescriptions  Pending Prescriptions Disp Refills  . atorvastatin (LIPITOR) 10 MG tablet [Pharmacy Med Name: ATORVASTATIN 10 MG TABLET] 90 tablet 0    Sig: TAKE 1 TABLET BY MOUTH EVERY DAY     Cardiovascular:  Antilipid - Statins Failed - 12/25/2020  1:25 AM      Failed - Total Cholesterol in normal range and within 360 days    Cholesterol  Date Value Ref Range Status  07/19/2019 167 <200 mg/dL Final         Failed - LDL in normal range and within 360 days    LDL Cholesterol (Calc)  Date Value Ref Range Status  07/19/2019 105 (H) mg/dL (calc) Final    Comment:    Reference range: <100 . Desirable range <100 mg/dL for primary prevention;   <70 mg/dL for patients with CHD or diabetic patients  with > or = 2 CHD risk factors. Marland Kitchen LDL-C is now calculated using the Martin-Hopkins  calculation, which is a validated novel method providing  better accuracy than the Friedewald equation in the  estimation of LDL-C.  Cresenciano Genre et al. Annamaria Helling. 9381;829(93): 2061-2068  (http://education.[employer].com/faq/FAQ164)          Failed - HDL in normal range and within 360 days    HDL  Date Value Ref Range Status  07/19/2019 31 (L) > OR = 50 mg/dL Final         Failed - Triglycerides in normal range and within 360 days    Triglycerides  Date Value Ref Range Status  07/19/2019 185 (H) <150 mg/dL Final         Passed - Patient is not pregnant      Passed - Valid encounter within last 12 months    Recent Outpatient Visits          3 months ago Chronic bilateral low back pain with left-sided sciatica   [place], DO   8 months ago Degenerative lumbar spinal stenosis   [place], DO   9 months ago Trochanteric bursitis of left hip   [place], DO   1 year ago Elevated hemoglobin A1c   [place], DO   1  year ago Acute otitis media, unspecified otitis media type   [place], Devonne Doughty, DO

## 2021-01-07 ENCOUNTER — Encounter: Payer: Self-pay | Admitting: Family Medicine

## 2021-01-07 ENCOUNTER — Other Ambulatory Visit: Payer: Self-pay

## 2021-01-07 ENCOUNTER — Ambulatory Visit (INDEPENDENT_AMBULATORY_CARE_PROVIDER_SITE_OTHER): Payer: Medicare Other | Admitting: Family Medicine

## 2021-01-07 VITALS — BP 173/68 | HR 79 | Ht 63.0 in | Wt 280.0 lb

## 2021-01-07 DIAGNOSIS — H9192 Unspecified hearing loss, left ear: Secondary | ICD-10-CM | POA: Diagnosis not present

## 2021-01-07 NOTE — Patient Instructions (Addendum)
Thank you for coming to the office today.  Stay tuned for apt  [facility] ENT/ Audiology [facility] [address]  [place], [place] [address] Ph: [phone]  No sign of ear wax or eczema in ear canals. There is some scarring on ear drums.   Please schedule a Follow-up Appointment to: Return if symptoms worsen or fail to improve.  If you have any other questions or concerns, please feel free to call the office or send a message through [place]. You may also schedule an earlier appointment if necessary.  Additionally, you may be receiving a survey about your experience at our office within a few days to 1 week by e-mail or mail. We value your feedback.  Nobie Putnam, DO [place]

## 2021-01-07 NOTE — Progress Notes (Signed)
Subjective:    Patient ID: Linna Thebeau, female    DOB: 08/01/1946, 75 y.o.   MRN: [id number]  Yaretzi Ernandez is a 75 y.o. female presenting on 01/07/2021 for Hearing Loss  Patient presents for a same day appointment.  HPI   Left Hearing Loss, fullness Reports chronic problem now worse recently with L ear worse than R with loss of hearing, feels occasional full or pressure. Asking about ear wax and also has history of eczema of ear canals. Her husband went to Audiology at [facility], and she asks for hearing evaluation as well. Denies any ear drainage or discharge or bleeding, new injury or trauma, sinusitis   Depression screen [facility] 2/9 01/07/2021 11/27/2019 04/24/2019  Decreased Interest 0 0 1  Down, Depressed, Hopeless 0 0 0  PHQ - 2 Score 0 0 1  Altered sleeping 0 - 3  Tired, decreased energy 1 - 3  Change in appetite 0 - 3  Feeling bad or failure about yourself  0 - 0  Trouble concentrating 0 - 0  Moving slowly or fidgety/restless 0 - 0  Suicidal thoughts 0 - 0  PHQ-9 Score 1 - 10  Difficult doing work/chores Not difficult at all - -    Social History   Tobacco Use  . Smoking status: Never Smoker  . Smokeless tobacco: Never Used  Vaping Use  . Vaping Use: Never used  Substance Use Topics  . Alcohol use: Yes    Alcohol/week: 3.0 standard drinks    Types: 3 Standard drinks or equivalent per week  . Drug use: Never    Review of Systems Per HPI unless specifically indicated above     Objective:    BP (!) 173/68   Pulse 79   Ht 5\' 3"  (1.6 m)   Wt 280 lb (127 kg)   SpO2 97%   BMI 49.60 kg/m   Wt Readings from Last 3 Encounters:  01/07/21 280 lb (127 kg)  11/11/20 283 lb (128.4 kg)  10/09/20 283 lb 3.2 oz (128.5 kg)    Physical Exam Vitals and nursing note reviewed.  Constitutional:      General: She is not in acute distress.    Appearance: She is well-developed. She is not diaphoretic.     Comments: Well-appearing, comfortable, cooperative  HENT:     Head:  Normocephalic and atraumatic.     Right Ear: Ear canal and external ear normal. There is no impacted cerumen.     Left Ear: Ear canal and external ear normal. There is no impacted cerumen.     Ears:     Comments: Mild scarring and haziness on bilateral TMs. No erythema. Eyes:     General:        Right eye: No discharge.        Left eye: No discharge.     Conjunctiva/sclera: Conjunctivae normal.  Cardiovascular:     Rate and Rhythm: Normal rate.  Pulmonary:     Effort: Pulmonary effort is normal.  Skin:    General: Skin is warm and dry.     Findings: No erythema or rash.  Neurological:     Mental Status: She is alert and oriented to person, place, and time.  Psychiatric:        Behavior: Behavior normal.     Comments: Well groomed, good eye contact, normal speech and thoughts    Results for orders placed or performed in visit on 11/27/19  POCT HgB A1C  Result Value Ref Range  Hemoglobin A1C 6.2 (A) 4.0 - 5.6 %      Assessment & Plan:   Problem List Items Addressed This Visit   None   Visit Diagnoses    Hearing loss of left ear, unspecified hearing loss type    -  Primary   Relevant Orders   Ambulatory referral to Audiology      Chronic gradual hearing loss, L>R, also some episodic pressure within ears at times, some old evidence of scarring on TM. No cerumen or other abnormality. Already on allergy meds and Flonase, limited results. Will refer to Audiology for consultation, hearing testing, may warrant hearing aid in future, or may reconsider ENT consultation in future if indicated.  Orders Placed This Encounter  Procedures  . Ambulatory referral to Audiology    Referral Priority:   Routine    Referral Type:   Audiology Exam    Referral Reason:   Specialty Services Required    Number of Visits Requested:   1     No orders of the defined types were placed in this encounter.    Follow up plan: Return if symptoms worsen or fail to improve.   Nobie Putnam, [facility] 01/07/2021, 1:41 PM

## 2021-02-10 ENCOUNTER — Other Ambulatory Visit: Payer: Self-pay

## 2021-02-10 ENCOUNTER — Encounter: Payer: Self-pay | Admitting: Family Medicine

## 2021-02-10 ENCOUNTER — Ambulatory Visit (INDEPENDENT_AMBULATORY_CARE_PROVIDER_SITE_OTHER): Payer: Medicare Other | Admitting: Family Medicine

## 2021-02-10 VITALS — BP 139/71 | HR 84 | Ht 63.0 in | Wt 282.4 lb

## 2021-02-10 DIAGNOSIS — Z6841 Body Mass Index (BMI) 40.0 and over, adult: Secondary | ICD-10-CM

## 2021-02-10 DIAGNOSIS — R7309 Other abnormal glucose: Secondary | ICD-10-CM

## 2021-02-10 DIAGNOSIS — E782 Mixed hyperlipidemia: Secondary | ICD-10-CM

## 2021-02-10 DIAGNOSIS — I10 Essential (primary) hypertension: Secondary | ICD-10-CM

## 2021-02-10 DIAGNOSIS — G4733 Obstructive sleep apnea (adult) (pediatric): Secondary | ICD-10-CM

## 2021-02-10 DIAGNOSIS — H9192 Unspecified hearing loss, left ear: Secondary | ICD-10-CM

## 2021-02-10 DIAGNOSIS — Z9989 Dependence on other enabling machines and devices: Secondary | ICD-10-CM

## 2021-02-10 DIAGNOSIS — M48061 Spinal stenosis, lumbar region without neurogenic claudication: Secondary | ICD-10-CM

## 2021-02-10 DIAGNOSIS — M65311 Trigger thumb, right thumb: Secondary | ICD-10-CM

## 2021-02-10 DIAGNOSIS — M79644 Pain in right finger(s): Secondary | ICD-10-CM | POA: Diagnosis not present

## 2021-02-10 DIAGNOSIS — M79645 Pain in left finger(s): Secondary | ICD-10-CM

## 2021-02-10 DIAGNOSIS — G8929 Other chronic pain: Secondary | ICD-10-CM

## 2021-02-10 MED ORDER — DICLOFENAC SODIUM 1 % EX GEL: 2.0000 g | Freq: Four times a day (QID) | CUTANEOUS | 2 refills | Status: DC | PRN

## 2021-02-10 NOTE — Patient Instructions (Addendum)
Thank you for coming to the office today.   [facility]/ Audiology [facility] [address]  [place], [place] [address] Ph: [phone]  Imodium as needed for the diarrhea to slow down, OTC medication, use preferred in morning.  Fiber supplement can help bulk up stool. Metamucil or other options.  OTC Peppermint Oil (Triple Coated Capsule) 180mg  take one 3 times daily to reduce diarrhea  Recommend using barrier topical cream like a Desitin protective layer on face.  May need to fashion own soft padding to put underneath.  Labs today.  [employer], [employer] research officer, government - try the water aerobics.  Please schedule a Follow-up Appointment to: Return in about 6 months (around 08/12/2021) for 6 month follow-up Back Pain, HTN.  If you have any other questions or concerns, please feel free to call the office or send a message through [place]. You may also schedule an earlier appointment if necessary.  Additionally, you may be receiving a survey about your experience at our office within a few days to 1 week by e-mail or mail. We value your feedback.  Nobie Putnam, DO [place]

## 2021-02-10 NOTE — Progress Notes (Signed)
Subjective:    Patient ID: Natasha Curry, female    DOB: Dec 09, 1945, 75 y.o.   MRN: [id number]  Natasha Curry is a 75 y.o. female presenting on 02/10/2021 for Medicare Wellness Exam  and Hearing Loss   HPI   Diarrhea,  Postprandial Worse in AM after coffee usually loose stool Admits bloating and gas production She is on PPI She has history of prolapsed anus and bladder and these impact pressure on her colon and causes urgency.  Chronic Low Back Pain, Lumbar DJD Lumbar Spinal Stenosis Followed by [facility], has had treatments and procedure in past. Now followed by Allyson Sabal - NP and is managed on Methocarbamol 1500mg  BID currently with great results.  RIght Thumb Trigger Finger Reports R thumb gets stuck and has pain worse than Left both both thumbs and hands.  Hearing loss Last visit 01/2021 referred to [facility]NT Audiology, waiting on call back to schedule.  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 She has issue with strap pressing on skin on her face causing irritation of skin.    Depression screen [facility] 2/9 02/10/2021 01/07/2021 11/27/2019  Decreased Interest 0 0 0  Down, Depressed, Hopeless 0 0 0  PHQ - 2 Score 0 0 0  Altered sleeping 0 0 -  Tired, decreased energy 1 1 -  Change in appetite 0 0 -  Feeling bad or failure about yourself  0 0 -  Trouble concentrating 0 0 -  Moving slowly or fidgety/restless 0 0 -  Suicidal thoughts 0 0 -  PHQ-9 Score 1 1 -  Difficult doing work/chores Not difficult at all Not difficult at all -    Social History   Tobacco Use  . Smoking status: Never Smoker  . Smokeless tobacco: Never Used  Vaping Use  . Vaping Use: Never used  Substance Use Topics  . Alcohol use: Yes    Alcohol/week: 3.0 standard drinks    Types: 3 Standard drinks or equivalent per week  . Drug use: Never     Review of Systems Per HPI unless specifically indicated above     Objective:    BP 139/71   Pulse 84   Ht 5\' 3"  (1.6 m)   Wt 282 lb 6.4 oz (128.1 kg)   SpO2 98%   BMI 50.02 kg/m   Wt Readings from Last 3 Encounters:  02/10/21 282 lb 6.4 oz (128.1 kg)  01/07/21 280 lb (127 kg)  11/11/20 283 lb (128.4 kg)    Physical Exam Vitals and nursing note reviewed.  Constitutional:      General: She is not in acute distress.    Appearance: She is well-developed. She is obese. She is not diaphoretic.     Comments: Well-appearing, comfortable, cooperative  HENT:     Head: Normocephalic and atraumatic.  Eyes:     General:        Right eye: No discharge.        Left eye: No discharge.     Conjunctiva/sclera: Conjunctivae normal.  Neck:     Thyroid: No thyromegaly.  Cardiovascular:     Rate and Rhythm: Normal rate and regular rhythm.     Heart sounds: Normal heart sounds. No murmur heard.   Pulmonary:     Effort: Pulmonary effort is normal. No respiratory distress.     Breath sounds:  Normal breath sounds. No wheezing or rales.  Musculoskeletal:        General: Normal range of motion.     Cervical back: Normal range of motion and neck supple.     Comments: Bilateral thumbs with R finger thumb triggering and pain on movement. No erythema or edema or deformity  Lymphadenopathy:     Cervical: No cervical adenopathy.  Skin:    General: Skin is warm and dry.     Findings: No erythema or rash.  Neurological:     Mental Status: She is alert and oriented to person, place, and time.  Psychiatric:        Behavior: Behavior normal.     Comments: Well groomed, good eye contact, normal speech and thoughts    No results for input(s): HGBA1C in the last 8760 hours.   Results for orders placed or performed in visit on 11/27/19  POCT HgB A1C  Result Value Ref Range   Hemoglobin A1C 6.2 (A) 4.0 - 5.6 %      Assessment & Plan:   Problem List Items Addressed This Visit    OSA on  CPAP   Morbid obesity with BMI of 45.0-49.9, adult ([place])   Relevant Orders   COMPLETE METABOLIC PANEL WITH GFR   Lipid panel   TSH   Mixed hyperlipidemia   Relevant Orders   Lipid panel   TSH   Essential hypertension   Relevant Orders   CBC with Differential/Platelet   COMPLETE METABOLIC PANEL WITH GFR   Lipid panel   Elevated hemoglobin A1c   Relevant Orders   Hemoglobin A1c   Degenerative lumbar spinal stenosis - Primary    Other Visit Diagnoses    Hearing loss of left ear, unspecified hearing loss type       Chronic thumb pain, bilateral       Relevant Medications   diclofenac Sodium (VOLTAREN) 1 % GEL   Trigger finger of right thumb       Relevant Medications   diclofenac Sodium (VOLTAREN) 1 % GEL      #Osteoarthritis multiple joints Bilateral hands arthritis R Thumb Trigger Finger  Advised to return to orthopedics Use topical diclofenac voltaren OTC on thumb, with current medications  #DJD Lumbar spinal stenosis Chronic low back pain Followed by [facility] pain management, on muscle relaxant methocarbamol currently with good results.  Elevated A1c Prior A1c 6.2, overdue, labs today to check  Hearing Loss Check into referral today, patient should be notified about this referral soon. She had not received a call for apt since 1 month  Morbid Obesity BMI >50 Counseling on lifestyle diet exercise  HTN Mild elevated BP Continues on Metoprolol XL 25mg  daily  Meds ordered this encounter  Medications  . diclofenac Sodium (VOLTAREN) 1 % GEL    Sig: Apply 2 g topically 4 (four) times daily as needed.    Dispense:  100 g    Refill:  2      Follow up plan: Return in about 6 months (around 08/12/2021) for 6 month follow-up Back Pain, HTN.    Nobie Putnam, [facility] 02/10/2021, 10:59 AM

## 2021-02-11 LAB — CBC WITH DIFFERENTIAL/PLATELET
Absolute Monocytes: 635 cells/uL (ref 200–950)
Basophils Absolute: 58 cells/uL (ref 0–200)
Basophils Relative: 0.8 %
Eosinophils Absolute: 168 cells/uL (ref 15–500)
Eosinophils Relative: 2.3 %
HCT: 39.7 % (ref 35.0–45.0)
Hemoglobin: 12.8 g/dL (ref 11.7–15.5)
Lymphs Abs: 2533 cells/uL (ref 850–3900)
MCH: 29.5 pg (ref 27.0–33.0)
MCHC: 32.2 g/dL (ref 32.0–36.0)
MCV: 91.5 fL (ref 80.0–100.0)
MPV: 10.6 fL (ref 7.5–12.5)
Monocytes Relative: 8.7 %
Neutro Abs: 3906 cells/uL (ref 1500–7800)
Neutrophils Relative %: 53.5 %
Platelets: 260 10*3/uL (ref 140–400)
RBC: 4.34 10*6/uL (ref 3.80–5.10)
RDW: 12.8 % (ref 11.0–15.0)
Total Lymphocyte: 34.7 %
WBC: 7.3 10*3/uL (ref 3.8–10.8)

## 2021-02-11 LAB — LIPID PANEL
Cholesterol: 165 mg/dL (ref <200)
HDL: 31 mg/dL — ABNORMAL LOW (ref >=50)
LDL Cholesterol (Calc): 100 mg/dL (calc) — ABNORMAL HIGH
Non-HDL Cholesterol (Calc): 134 mg/dL (calc) — ABNORMAL HIGH (ref <130)
Total CHOL/HDL Ratio: 5.3 (calc) — ABNORMAL HIGH (ref <5.0)
Triglycerides: 223 mg/dL — ABNORMAL HIGH (ref <150)

## 2021-02-11 LAB — COMPLETE METABOLIC PANEL WITH GFR
AG Ratio: 1.7 (calc) (ref 1.0–2.5)
ALT: 10 U/L (ref 6–29)
AST: 13 U/L (ref 10–35)
Albumin: 4.1 g/dL (ref 3.6–5.1)
Alkaline phosphatase (APISO): 105 U/L (ref 37–153)
BUN: 13 mg/dL (ref 7–25)
CO2: 26 mmol/L (ref 20–32)
Calcium: 9.1 mg/dL (ref 8.6–10.4)
Chloride: 108 mmol/L (ref 98–110)
Creat: 0.76 mg/dL (ref 0.60–0.93)
GFR, Est African American: 89 mL/min/{1.73_m2} (ref >=60)
GFR, Est Non African American: 77 mL/min/{1.73_m2} (ref >=60)
Globulin: 2.4 g/dL (calc) (ref 1.9–3.7)
Glucose, Bld: 121 mg/dL — ABNORMAL HIGH (ref 65–99)
Potassium: 4.4 mmol/L (ref 3.5–5.3)
Sodium: 142 mmol/L (ref 135–146)
Total Bilirubin: 0.6 mg/dL (ref 0.2–1.2)
Total Protein: 6.5 g/dL (ref 6.1–8.1)

## 2021-02-11 LAB — HEMOGLOBIN A1C
Hgb A1c MFr Bld: 6.7 % of total Hgb — ABNORMAL HIGH (ref <5.7)
Mean Plasma Glucose: 146 mg/dL
eAG (mmol/L): 8.1 mmol/L

## 2021-02-11 LAB — TSH: TSH: 1.27 mIU/L (ref 0.40–4.50)

## 2021-02-15 IMAGING — CT CT L SPINE W/O CM
3 of 4 series · 9 of 33 positions shown, 11 images · non-contrast
Comparison: Left hip CT this morning reported separately.

CLINICAL DATA: 74-year-old female with 3 weeks of pain radiating to
the left hip and leg.

EXAM:
CT LUMBAR SPINE WITHOUT CONTRAST
TECHNIQUE: Multidetector CT imaging of the lumbar spine was performed without
intravenous contrast administration. Multiplanar CT image
reconstructions were also generated.

[Series 8: sagittal bone · sagittal · 0.27mm/px · 5 of 57 slices shown, 6 images]
[im 19/57  bone]
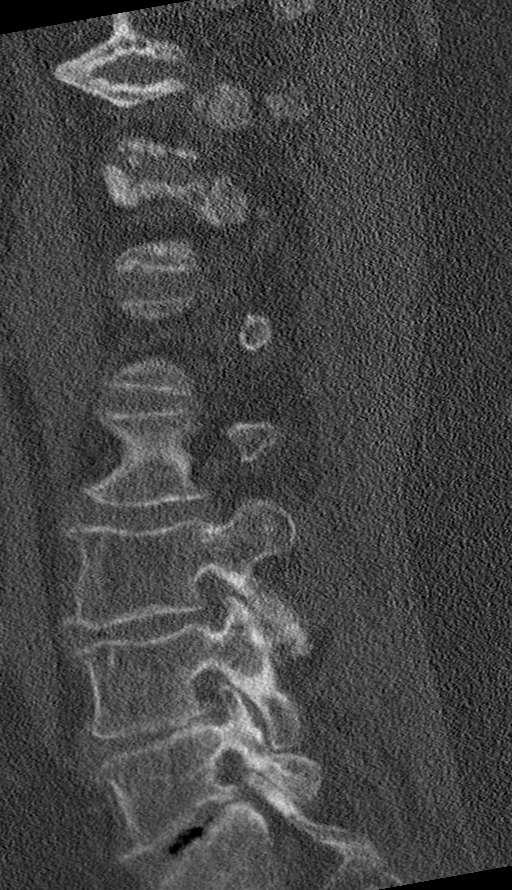
[im 24/57  bone]
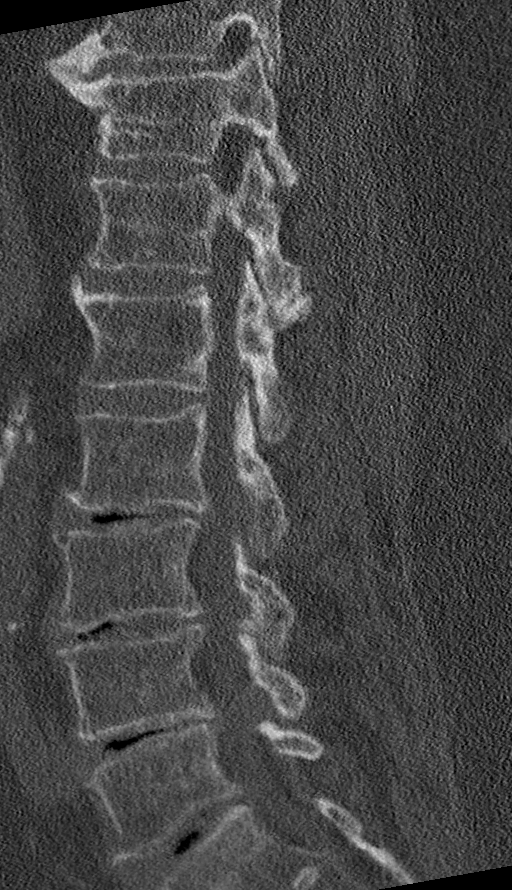
[im 29/57  soft-tissue]
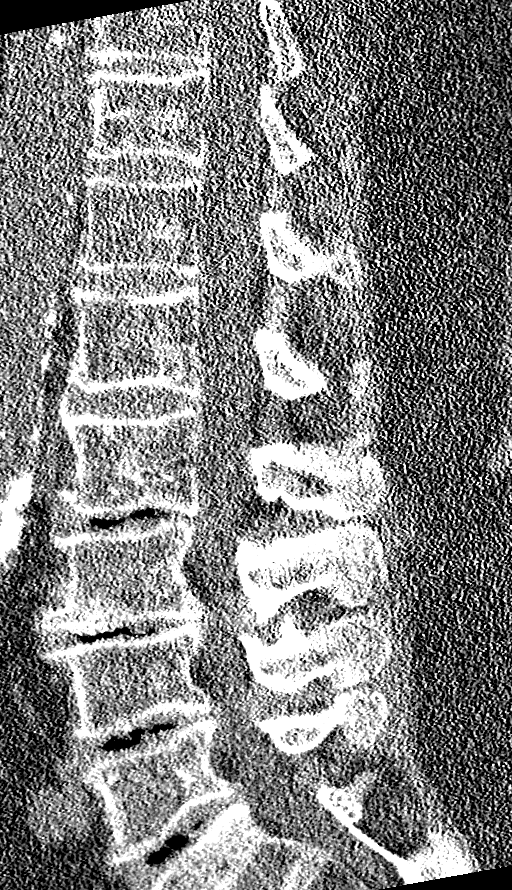
[im 29/57  bone]
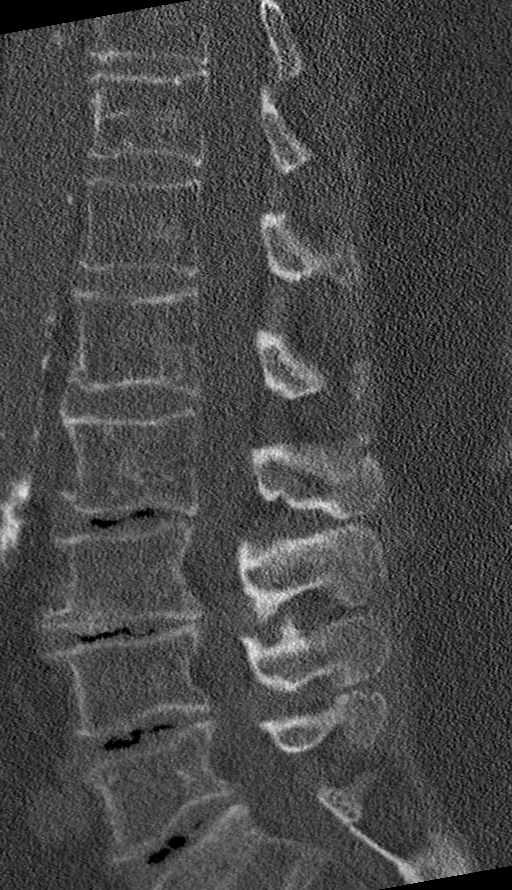
[im 33/57  bone]
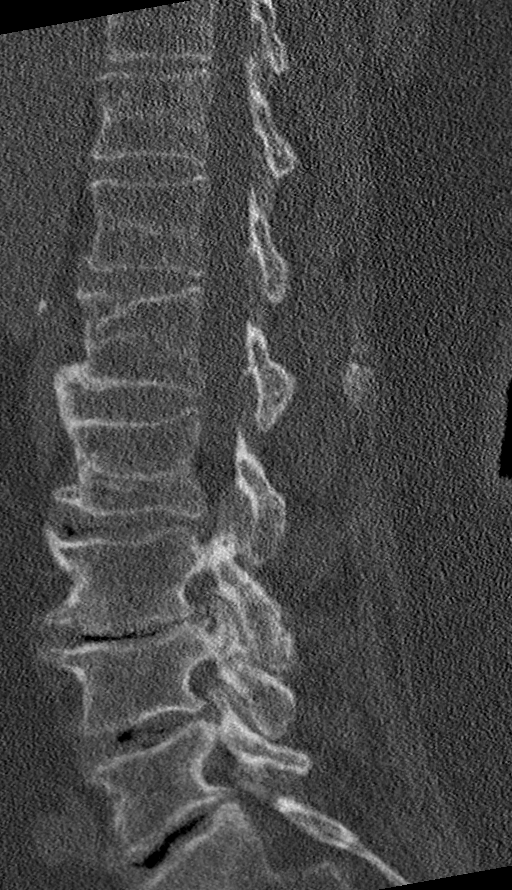
[im 38/57  bone]
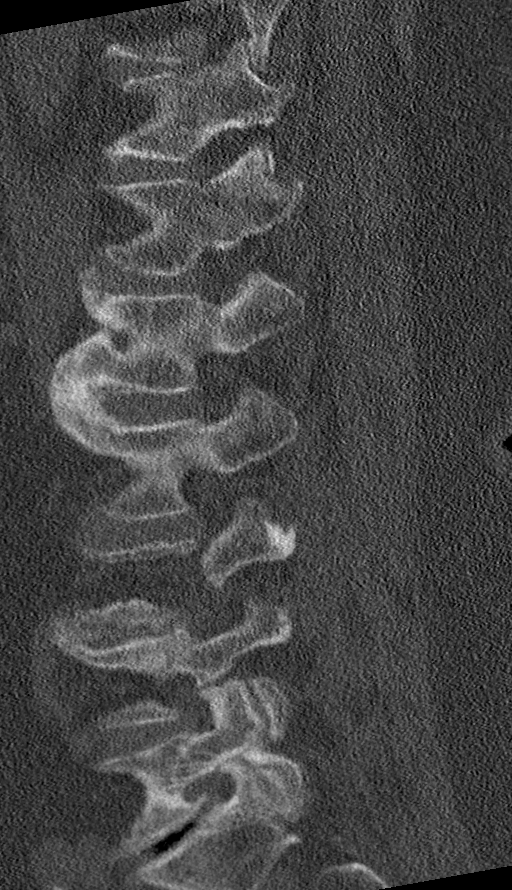

[Series 9: coronal bone · coronal · 0.22mm/px · 3 of 70 slices shown]
[im 14/70  bone]
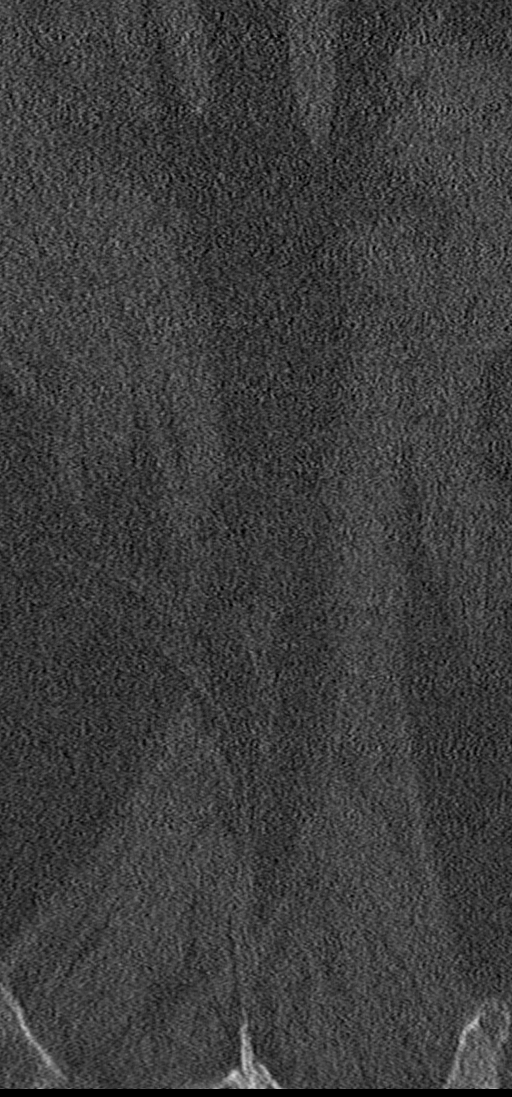
[im 28/70  bone]
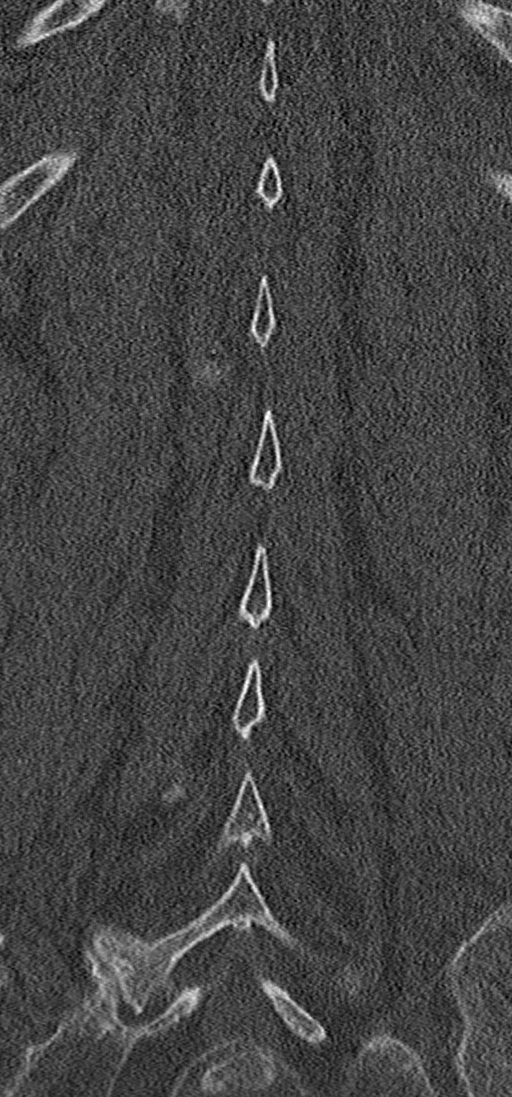
[im 42/70  bone]
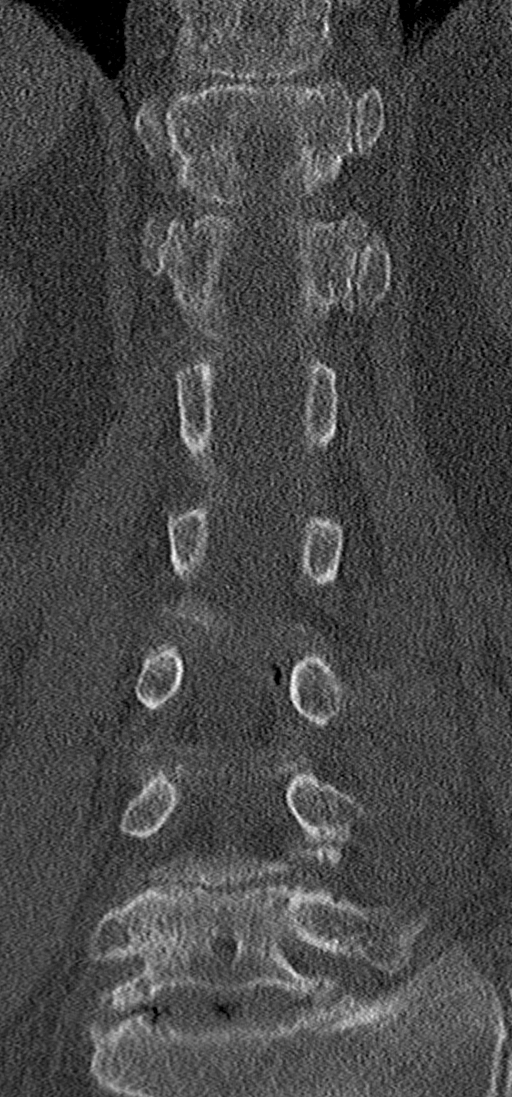

[Series 10: multi disc · axial · 0.21mm/px · z∈[-705,-705]mm · 1 of 99 slices shown, 2 images]
[im 59/99  soft-tissue]
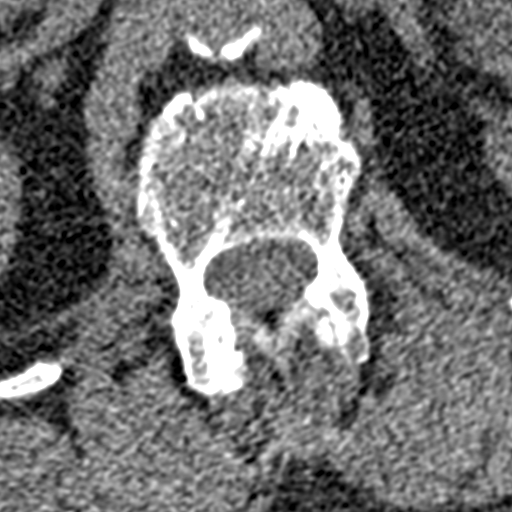
[im 59/99  bone]
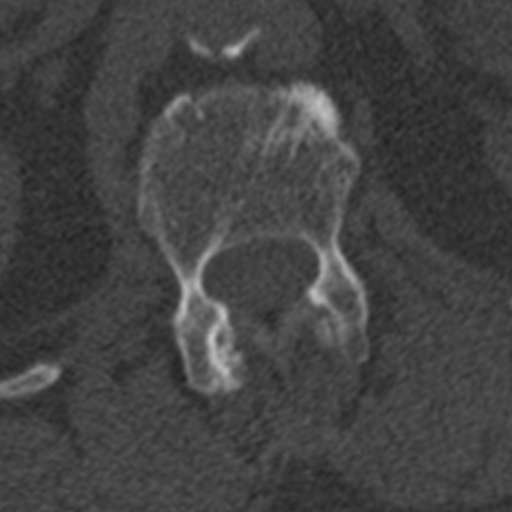

[9 of 33 positions shown; findings below may reference images not displayed]

FINDINGS: Segmentation: Normal.

Alignment: Straightening of lumbar lordosis. Slight dextroconvex
lumbar scoliosis. No spondylolisthesis.

Vertebrae: Osteopenia. No acute osseous abnormality identified.
Intact visible sacrum and SI joints.

Paraspinal and other soft tissues: Partially visible exophytic but
simple fluid density mass of the left renal midpole, likely a simple
cyst. Superimposed left lower pole nephrolithiasis. Partially
visible probable benign right renal parapelvic cyst. Aortoiliac
calcified atherosclerosis. Negative lumbar paraspinal soft tissues.

Disc levels:

T12-L1:  Negative.

L1-L2:  Negative.

L2-L3: Moderate to severe disc space loss with vacuum disc and
circumferential disc osteophyte complex. Mild spinal stenosis.

L3-L4: Severe disc space loss with vacuum disc and circumferential
disc osteophyte complex. Mild to moderate posterior element
hypertrophy. Mild to moderate spinal stenosis and bilateral L3
foraminal stenosis.

L4-L5: Severe disc space loss with vacuum disc. Circumferential disc
osteophyte complex and mild to moderate posterior element
hypertrophy. Mild to moderate spinal and left greater than right L4
foraminal stenosis.

L5-S1: Severe disc space loss with vacuum disc. Circumferential disc
osteophyte complex. Mild to moderate facet hypertrophy. No spinal
stenosis. Moderate bilateral L5 foraminal stenosis.
IMPRESSION: 1. Osteopenia. No acute osseous abnormality in the lumbar spine.
2. Multilevel advanced lumbar disc and endplate degeneration with up
to moderate multifactorial spinal and moderate bilateral foraminal
stenosis at L3-L4 and L4-L5.
3. Left nephrolithiasis.
4. Aortic Atherosclerosis ([id number]).

## 2021-02-19 ENCOUNTER — Emergency Department
Admission: EM | Admit: 2021-02-19 | Discharge: 2021-02-19 | Disposition: A | Discharge status: Home / Self Care | Payer: Medicare Other | Attending: Emergency Medicine | Admitting: Emergency Medicine

## 2021-02-19 ENCOUNTER — Other Ambulatory Visit: Payer: Self-pay

## 2021-02-19 ENCOUNTER — Emergency Department: Payer: Medicare Other

## 2021-02-19 DIAGNOSIS — W010XXA Fall on same level from slipping, tripping and stumbling without subsequent striking against object, initial encounter: Secondary | ICD-10-CM | POA: Insufficient documentation

## 2021-02-19 DIAGNOSIS — S4992XA Unspecified injury of left shoulder and upper arm, initial encounter: Secondary | ICD-10-CM | POA: Diagnosis present

## 2021-02-19 DIAGNOSIS — S42212A Unspecified displaced fracture of surgical neck of left humerus, initial encounter for closed fracture: Secondary | ICD-10-CM | POA: Insufficient documentation

## 2021-02-19 DIAGNOSIS — Z85828 Personal history of other malignant neoplasm of skin: Secondary | ICD-10-CM | POA: Insufficient documentation

## 2021-02-19 DIAGNOSIS — Z96659 Presence of unspecified artificial knee joint: Secondary | ICD-10-CM | POA: Insufficient documentation

## 2021-02-19 MED ORDER — HYDROMORPHONE HCL 1 MG/ML IJ SOLN
1.0000 mg | INTRAMUSCULAR | Status: AC
  Administered 2021-02-19: 1 mg via INTRAVENOUS
  Filled 2021-02-19: qty 1

## 2021-02-19 MED ORDER — ONDANSETRON 4 MG PO TBDP: 8.0000 mg | ORAL_TABLET | Freq: Once | ORAL | Status: DC

## 2021-02-19 MED ORDER — HALOPERIDOL LACTATE 5 MG/ML IJ SOLN: 2.0000 mg | Freq: Once | INTRAMUSCULAR | Status: DC

## 2021-02-19 MED ORDER — OXYCODONE-ACETAMINOPHEN 5-325 MG PO TABS
2.0000 | ORAL_TABLET | Freq: Once | ORAL | Status: AC
  Administered 2021-02-19: 2 via ORAL
  Filled 2021-02-19: qty 2

## 2021-02-19 MED ORDER — OXYCODONE-ACETAMINOPHEN 5-325 MG PO TABS: 1.0000 | ORAL_TABLET | Freq: Four times a day (QID) | ORAL | 0 refills | Status: AC | PRN

## 2021-02-19 NOTE — ED Provider Notes (Addendum)
[facility] Emergency Department Provider Note  ____________________________________________  Time seen: Approximately 8:21 PM  I have reviewed the triage vital signs and the nursing notes.   HISTORY  Chief Complaint Fall (LEFT shoulder pain secondary to slipping and falling in yard during storm.  )    HPI [name] is a 75 y.o. female with a history of kidney stones GERD and obesity who comes the ED complaining of left shoulder pain after a slip and fall.  She reports that she was outside feeding deer in a rain storm when she slipped on the mud.  She fell onto her left shoulder, resulting in sudden severe pain which is worse with movement.  Nonradiating.  No alleviating factors.  Denies head injury or loss of consciousness, no neck pain.  No other injuries.  Able to ambulate.    Past Medical History:  Diagnosis Date   Allergy    Anxiety    Cancer ([place])    basil cell   Colon polyp    Depression    Eczema    GERD (gastroesophageal reflux disease)    Kidney stones    Psoriasis    Sleep apnea    Tremor      Patient Active Problem List   Diagnosis Date Noted   Supraclavicular mass 10/09/2020   Degenerative lumbar spinal stenosis 04/08/2020   Essential hypertension 09/25/2019   Elevated hemoglobin A1c 07/26/2019   Recurrent nephrolithiasis 04/24/2019   Basal cell carcinoma (BCC) of left lower eyelid 04/24/2019   OSA on CPAP 04/24/2019   Bilateral lower extremity edema 04/24/2019   Morbid obesity with BMI of 45.0-49.9, adult ([place]) 04/24/2019   Mixed hyperlipidemia 04/24/2019   GAD (generalized anxiety disorder) 04/24/2019     Past Surgical History:  Procedure Laterality Date   CHOLECYSTECTOMY  2000   GASTRIC BYPASS  2013   TOTAL KNEE ARTHROPLASTY  2014     Prior to Admission medications   Medication Sig Start Date End Date Taking? Authorizing Provider  oxyCODONE-acetaminophen (PERCOCET) 5-325 MG tablet Take 1-2 tablets by mouth every  6 (six) hours as needed for up to 5 days for severe pain. 02/19/21 02/24/21 Yes Carrie Mew, MD  atorvastatin (LIPITOR) 10 MG tablet TAKE 1 TABLET BY MOUTH EVERY DAY 12/25/20   Parks Ranger, Devonne Doughty, DO  diclofenac Sodium (VOLTAREN) 1 % GEL Apply 2 g topically 4 (four) times daily as needed. 02/10/21   Karamalegos, Devonne Doughty, DO  escitalopram (LEXAPRO) 20 MG tablet TAKE 1 TABLET BY MOUTH EVERY DAY 12/24/20   Karamalegos, Devonne Doughty, DO  fluticasone (FLONASE) 50 MCG/ACT nasal spray PLACE 2 SPRAYS INTO BOTH NOSTRILS DAILY. USE FOR 4-6 WEEKS THEN STOP AND USE SEASONALLY OR AS NEEDED. 11/23/20   Parks Ranger, Devonne Doughty, DO  lidocaine (LIDODERM) 5 % Place 1 patch onto the skin every 12 (twelve) hours. Remove & Discard patch within 12 hours or as directed by MD 04/07/20 04/07/21  Sable Feil, PA-C  methocarbamol (ROBAXIN) 750 MG tablet Take 2 tablets (1,500 mg total) by mouth every 6 (six) hours as needed for muscle spasms. 09/01/20   Karamalegos, Devonne Doughty, DO  metoprolol succinate (TOPROL-XL) 25 MG 24 hr tablet TAKE 1/2 TABLET BY MOUTH EVERY DAY 11/08/20   Parks Ranger, Devonne Doughty, DO  omeprazole (PRILOSEC) 40 MG capsule TAKE 1 CAPSULE BY MOUTH EVERY DAY 06/17/20   Karamalegos, Devonne Doughty, DO  triamcinolone cream (KENALOG) 0.5 % Apply 1 application topically 2 (two) times daily. To affected areas, for up to  2 weeks. 09/02/20   Olin Hauser, DO     Allergies Patient has no known allergies.   Family History  Problem Relation Age of Onset   Cancer Mother        adrenal   Heart attack Father    Heart disease Father    Stroke Father    Breast cancer Neg Hx     Social History Social History   Tobacco Use   Smoking status: Never   Smokeless tobacco: Never  Vaping Use   Vaping Use: Never used  Substance Use Topics   Alcohol use: Yes    Alcohol/week: 3.0 standard drinks    Types: 3 Standard drinks or equivalent per week   Drug use: Never    Review of  Systems  Constitutional:   No fever or chills.  ENT:   No sore throat. No rhinorrhea. Cardiovascular:   No chest pain or syncope. Respiratory:   No dyspnea or cough. Gastrointestinal:   Negative for abdominal pain, vomiting and diarrhea.  Musculoskeletal:   Left shoulder pain as above All other systems reviewed and are negative except as documented above in ROS and HPI.  ____________________________________________   PHYSICAL EXAM:  VITAL SIGNS: ED Triage Vitals  Enc Vitals Group     BP 02/19/21 1923 (!) 232/84     Pulse Rate 02/19/21 1914 88     Resp 02/19/21 1914 18     Temp 02/19/21 1914 98.1 F (36.7 C)     Temp Source 02/19/21 1914 Oral     SpO2 02/19/21 1914 98 %     Weight 02/19/21 1918 282 lb 3 oz (128 kg)     Height 02/19/21 1918 5\' 3"  (1.6 m)     Head Circumference --      Peak Flow --      Pain Score 02/19/21 1917 10     Pain Loc --      Pain Edu? --      Excl. in [place]? --     Vital signs reviewed, nursing assessments reviewed.   Constitutional:   Alert and oriented. Non-toxic appearance. Eyes:   Conjunctivae are normal. EOMI. PERRL. ENT      Head:   Normocephalic and atraumatic.      Nose:   Wearing a mask.      Mouth/Throat:   Wearing a mask.      Neck:   No meningismus. Full ROM. Hematological/Lymphatic/Immunilogical:   No cervical lymphadenopathy. Cardiovascular:   RRR. Symmetric bilateral radial and DP pulses.  No murmurs. Cap refill less than 2 seconds. Respiratory:   Normal respiratory effort without tachypnea/retractions. Breath sounds are clear and equal bilaterally. No wheezes/rales/rhonchi. Gastrointestinal:   Soft and nontender. Non distended. There is no CVA tenderness.  No rebound, rigidity, or guarding. Genitourinary:   deferred Musculoskeletal:   Tenderness at left proximal humerus.  No obvious deformity.  Rest of the left upper extremity unremarkable, compartments soft, no other bony tenderness.  Elbow wrist and hand unremarkable.  Clavicle  stable, chest wall stable.  Other extremities are normal.. Neurologic:   Normal speech and language.  Motor grossly intact. No acute focal neurologic deficits are appreciated.  Skin:    Skin is warm, dry and intact. No rash noted.  No petechiae, purpura, or bullae.  ____________________________________________    LABS (pertinent positives/negatives) (all labs ordered are listed, but only abnormal results are displayed) Labs Reviewed - No data to display ____________________________________________   EKG  Interpreted by me Normal  sinus rhythm rate of 89.  Normal axis and intervals.  Normal QRS ST segments and T waves.  Occasional premature supraventricular complexes.  ____________________________________________    UXYBFXOVA  DG Shoulder Left  Result Date: 02/19/2021 CLINICAL DATA:  Left shoulder pain after fall EXAM: LEFT SHOULDER - 2+ VIEW COMPARISON:  None. FINDINGS: AC joint is intact. There are mild degenerative changes. Acute fracture involving the left humeral neck and greater tuberosity. Probable displacement of greater tuberosity fracture fragment. No humeral head dislocation. IMPRESSION: Acute left humeral neck and greater tuberosity fracture. Electronically Signed   By: Donavan Foil M.D.   On: 02/19/2021 19:49    ____________________________________________   PROCEDURES Procedures  ____________________________________________    CLINICAL IMPRESSION / ASSESSMENT AND PLAN / ED COURSE  Medications ordered in the ED: Medications  HYDROmorphone (DILAUDID) injection 1 mg (1 mg Intravenous Given 02/19/21 1923)    Pertinent labs & imaging results that were available during my care of the patient were reviewed by me and considered in my medical decision making (see chart for details).  Natasha Curry was evaluated in Emergency Department on 02/19/2021 for the symptoms described in the history of present illness. She was evaluated in the context of the global COVID-19  pandemic, which necessitated consideration that the patient might be at risk for infection with the SARS-CoV-2 virus that causes COVID-19. Institutional protocols and algorithms that pertain to the evaluation of patients at risk for COVID-19 are in a state of rapid change based on information released by regulatory bodies including the CDC and federal and state organizations. These policies and algorithms were followed during the patient's care in the ED.   Patient presents with left shoulder pain after a fall.  X-ray viewed and interpreted by me, shows humeral head fracture which is nondisplaced.  No dislocation.  No other apparent injuries, normal clavicle and left chest wall.  Radiology report agrees.  Patient placed in sling.  Will prescribe Percocet, referred to orthopedics.      ____________________________________________   FINAL CLINICAL IMPRESSION(S) / ED DIAGNOSES    Final diagnoses:  Fracture of humerus neck, left, closed, initial encounter     ED Discharge Orders          Ordered    oxyCODONE-acetaminophen (PERCOCET) 5-325 MG tablet  Every 6 hours PRN        02/19/21 2015            Portions of this note were generated with dragon dictation software. Dictation errors may occur despite best attempts at proofreading.   Carrie Mew, MD 02/19/21 2029    Carrie Mew, MD 02/19/21 2033

## 2021-02-19 NOTE — Discharge Instructions (Addendum)
Your x-ray shows a fracture at the top of the arm bone where it starts to join the shoulder joint.  Wear the sling at all times to support your arm, take Percocet as needed for pain, and follow-up with orthopedics for further assessment.

## 2021-02-19 NOTE — ED Triage Notes (Signed)
BIBA from home related to LEFT shoulder pain secondary to a slip and fall outside in mud during the storm.  Patient received Fentanyl 72mcg loading, 16mcg x 2 during the transport to this facility.    Elevated blood pressure, hx of same.  Pain management for herniated ruptured discs, per patient.

## 2021-03-18 ENCOUNTER — Other Ambulatory Visit: Payer: Self-pay

## 2021-03-18 ENCOUNTER — Encounter: Payer: Self-pay | Admitting: Family Medicine

## 2021-03-18 ENCOUNTER — Telehealth (INDEPENDENT_AMBULATORY_CARE_PROVIDER_SITE_OTHER): Payer: Medicare Other | Admitting: Family Medicine

## 2021-03-18 VITALS — Ht 63.0 in | Wt 280.0 lb

## 2021-03-18 DIAGNOSIS — B354 Tinea corporis: Secondary | ICD-10-CM | POA: Diagnosis not present

## 2021-03-18 MED ORDER — CICLOPIROX OLAMINE 0.77 % EX CREA: TOPICAL_CREAM | Freq: Two times a day (BID) | CUTANEOUS | 1 refills | Status: DC

## 2021-03-18 NOTE — Progress Notes (Signed)
Subjective:    Patient ID: Cathe Bilger, female    DOB: 1946/04/19, 75 y.o.   MRN: [id number]  Minetta Krisher is a 75 y.o. female presenting on 03/18/2021 for Rash  Virtual / Telehealth Encounter - Video Visit via [place] The purpose of this virtual visit is to provide medical care while limiting exposure to the novel coronavirus (COVID19) for both patient and office staff.  Consent was obtained for remote visit:  Yes.   Answered questions that patient had about telehealth interaction:  Yes.   I discussed the limitations, risks, security and privacy concerns of performing an evaluation and management service by video/telephone. I also discussed with the patient that there may be a patient responsible charge related to this service. The patient expressed understanding and agreed to proceed.  Patient Location: Home Provider Location: [facility] (Office)  Participants in virtual visit: - Patient: Yvonnie Schinke - CMA: Orinda Kenner, CMA - Provider: Dr Parks Ranger   HPI  Tinea Corporis Rash on R Forearm, round lesion x 2 with red border circular appearance some flaky and itchy, tried topical steroid no relief.  Recent course ED visit 02/19/21 for fall with fractured humerus, was put in sling and treated and ultimately states had some residual mud/clay still on arm and said it may have caused infection.  Denies any fever chills sweats other rash, drainage, bleeding.    Depression screen [facility] 2/9 02/10/2021 01/07/2021 11/27/2019  Decreased Interest 0 0 0  Down, Depressed, Hopeless 0 0 0  PHQ - 2 Score 0 0 0  Altered sleeping 0 0 -  Tired, decreased energy 1 1 -  Change in appetite 0 0 -  Feeling bad or failure about yourself  0 0 -  Trouble concentrating 0 0 -  Moving slowly or fidgety/restless 0 0 -  Suicidal thoughts 0 0 -  PHQ-9 Score 1 1 -  Difficult doing work/chores Not difficult at all Not difficult at all -    Social History   Tobacco Use   Smoking  status: Never   Smokeless tobacco: Never  Vaping Use   Vaping Use: Never used  Substance Use Topics   Alcohol use: Yes    Alcohol/week: 3.0 standard drinks    Types: 3 Standard drinks or equivalent per week   Drug use: Never    Review of Systems Per HPI unless specifically indicated above     Objective:    Ht 5\' 3"  (1.6 m)   Wt 280 lb (127 kg)   BMI 49.60 kg/m   Wt Readings from Last 3 Encounters:  03/18/21 280 lb (127 kg)  02/19/21 282 lb 3 oz (128 kg)  02/10/21 282 lb 6.4 oz (128.1 kg)    Physical Exam  Note examination was completely remotely via video observation objective data only  Gen - well-appearing, no acute distress or apparent pain, comfortable HEENT - eyes appear clear without discharge or redness Skin - localized annular rash circular well defined on R forearm x 2 lesions Neuro - awake, alert, oriented Psych - not anxious appearing   Results for orders placed or performed in visit on 02/10/21  Hemoglobin A1c  Result Value Ref Range   Hgb A1c MFr Bld 6.7 (H) <5.7 % of total Hgb   Mean Plasma Glucose 146 mg/dL   eAG (mmol/L) 8.1 mmol/L  CBC with Differential/Platelet  Result Value Ref Range   WBC 7.3 3.8 - 10.8 Thousand/uL   RBC 4.34 3.80 - 5.10 Million/uL  Hemoglobin 12.8 11.7 - 15.5 g/dL   HCT 39.7 35.0 - 45.0 %   MCV 91.5 80.0 - 100.0 fL   MCH 29.5 27.0 - 33.0 pg   MCHC 32.2 32.0 - 36.0 g/dL   RDW 12.8 11.0 - 15.0 %   Platelets 260 140 - 400 Thousand/uL   MPV 10.6 7.5 - 12.5 fL   Neutro Abs 3,906 1,500 - 7,800 cells/uL   Lymphs Abs 2,533 850 - 3,900 cells/uL   Absolute Monocytes 635 200 - 950 cells/uL   Eosinophils Absolute 168 15 - 500 cells/uL   Basophils Absolute 58 0 - 200 cells/uL   Neutrophils Relative % 53.5 %   Total Lymphocyte 34.7 %   Monocytes Relative 8.7 %   Eosinophils Relative 2.3 %   Basophils Relative 0.8 %  COMPLETE METABOLIC PANEL WITH GFR  Result Value Ref Range   Glucose, Bld 121 (H) 65 - 99 mg/dL   BUN 13 7 - 25  mg/dL   Creat 0.76 0.60 - 0.93 mg/dL   GFR, Est Non African American 77 > OR = 60 mL/min/1.59m2   GFR, Est African American 89 > OR = 60 mL/min/1.68m2   BUN/Creatinine Ratio NOT APPLICABLE 6 - 22 (calc)   Sodium 142 135 - 146 mmol/L   Potassium 4.4 3.5 - 5.3 mmol/L   Chloride 108 98 - 110 mmol/L   CO2 26 20 - 32 mmol/L   Calcium 9.1 8.6 - 10.4 mg/dL   Total Protein 6.5 6.1 - 8.1 g/dL   Albumin 4.1 3.6 - 5.1 g/dL   Globulin 2.4 1.9 - 3.7 g/dL (calc)   AG Ratio 1.7 1.0 - 2.5 (calc)   Total Bilirubin 0.6 0.2 - 1.2 mg/dL   Alkaline phosphatase (APISO) 105 37 - 153 U/L   AST 13 10 - 35 U/L   ALT 10 6 - 29 U/L  Lipid panel  Result Value Ref Range   Cholesterol 165 <200 mg/dL   HDL 31 (L) > OR = 50 mg/dL   Triglycerides 223 (H) <150 mg/dL   LDL Cholesterol (Calc) 100 (H) mg/dL (calc)   Total CHOL/HDL Ratio 5.3 (H) <5.0 (calc)   Non-HDL Cholesterol (Calc) 134 (H) <130 mg/dL (calc)  TSH  Result Value Ref Range   TSH 1.27 0.40 - 4.50 mIU/L      Assessment & Plan:   Problem List Items Addressed This Visit   None Visit Diagnoses     Tinea corporis    -  Primary   Relevant Medications   ciclopirox (LOPROX) 0.77 % cream       Clinically consistent on video visit with annular rash on forearm x 2 spots w/ Tinea Corporis  Treat with Ciclopiroax 0.77% cream BID 2-4 weeks Follow up if unresolved  Meds ordered this encounter  Medications   ciclopirox (LOPROX) 0.77 % cream    Sig: Apply topically 2 (two) times daily. 2-4 weeks.    Dispense:  30 g    Refill:  1      Follow up plan: Return if symptoms worsen or fail to improve.  Nobie Putnam, [facility] 03/18/2021, 3:36 PM

## 2021-03-18 NOTE — Patient Instructions (Addendum)
   Please schedule a Follow-up Appointment to: Return if symptoms worsen or fail to improve.  If you have any other questions or concerns, please feel free to call the office or send a message through MyChart. You may also schedule an earlier appointment if necessary.  Additionally, you may be receiving a survey about your experience at our office within a few days to 1 week by e-mail or mail. We value your feedback.  Loula Marcella, DO [facility], [facility] 

## 2021-03-21 ENCOUNTER — Other Ambulatory Visit: Payer: Self-pay | Admitting: Family Medicine

## 2021-03-21 DIAGNOSIS — F411 Generalized anxiety disorder: Secondary | ICD-10-CM

## 2021-03-21 NOTE — Telephone Encounter (Signed)
Requested Prescriptions  Pending Prescriptions Disp Refills  . escitalopram (LEXAPRO) 20 MG tablet [Pharmacy Med Name: ESCITALOPRAM 20 MG TABLET] 30 tablet 0    Sig: TAKE 1 TABLET BY MOUTH EVERY DAY     Psychiatry:  Antidepressants - SSRI Passed - 03/21/2021  9:29 AM      Passed - Valid encounter within last 6 months    Recent Outpatient Visits          3 days ago Tinea corporis   [facility] [place], Devonne Doughty, DO   1 month ago Degenerative lumbar spinal stenosis   [place], DO   2 months ago Hearing loss of left ear, unspecified hearing loss type   [place], DO   6 months ago Chronic bilateral low back pain with left-sided sciatica   [place], DO   11 months ago Degenerative lumbar spinal stenosis   [place], Devonne Doughty, DO      Future Appointments            In 3 days [facility], [facility]

## 2021-03-23 ENCOUNTER — Other Ambulatory Visit: Payer: Self-pay | Admitting: Family Medicine

## 2021-03-23 DIAGNOSIS — E782 Mixed hyperlipidemia: Secondary | ICD-10-CM

## 2021-03-24 ENCOUNTER — Ambulatory Visit (INDEPENDENT_AMBULATORY_CARE_PROVIDER_SITE_OTHER): Payer: Medicare Other

## 2021-03-24 VITALS — Ht 63.0 in | Wt 280.0 lb

## 2021-03-24 DIAGNOSIS — Z0000 Encounter for general adult medical examination without abnormal findings: Secondary | ICD-10-CM | POA: Diagnosis not present

## 2021-03-24 NOTE — Patient Instructions (Signed)
Natasha Curry , Thank you for taking time to come for your Medicare Wellness Visit. I appreciate your ongoing commitment to your health goals. Please review the following plan we discussed and let me know if I can assist you in the future.   Screening recommendations/referrals: Colonoscopy: not required Mammogram: completed 11/05/2020 Bone Density: completed 09/06/2018 Recommended yearly ophthalmology/optometry visit for glaucoma screening and checkup Recommended yearly dental visit for hygiene and checkup  Vaccinations: Influenza vaccine: completed 05/29/2020, due 04/06/2021 Pneumococcal vaccine: completed 12/15/2018 Tdap vaccine: due Shingles vaccine: completed   Covid-19: 06/15/2020, 11/12/2019, 10/22/2019  Advanced directives: Please bring a copy of your POA (Power of Attorney) and/or Living Will to your next appointment.   Conditions/risks identified: none  Next appointment: Follow up in one year for your annual wellness visit    Preventive Care 65 Years and Older, Female Preventive care refers to lifestyle choices and visits with your health care provider that can promote health and wellness. What does preventive care include? A yearly physical exam. This is also called an annual well check. Dental exams once or twice a year. Routine eye exams. Ask your health care provider how often you should have your eyes checked. Personal lifestyle choices, including: Daily care of your teeth and gums. Regular physical activity. Eating a healthy diet. Avoiding tobacco and drug use. Limiting alcohol use. Practicing safe sex. Taking low-dose aspirin every day. Taking vitamin and mineral supplements as recommended by your health care provider. What happens during an annual well check? The services and screenings done by your health care provider during your annual well check will depend on your age, overall health, lifestyle risk factors, and family history of disease. Counseling  Your health care  provider may ask you questions about your: Alcohol use. Tobacco use. Drug use. Emotional well-being. Home and relationship well-being. Sexual activity. Eating habits. History of falls. Memory and ability to understand (cognition). Work and work Statistician. Reproductive health. Screening  You may have the following tests or measurements: Height, weight, and BMI. Blood pressure. Lipid and cholesterol levels. These may be checked every 5 years, or more frequently if you are over 8 years old. Skin check. Lung cancer screening. You may have this screening every year starting at age 76 if you have a 30-pack-year history of smoking and currently smoke or have quit within the past 15 years. Fecal occult blood test (FOBT) of the stool. You may have this test every year starting at age 40. Flexible sigmoidoscopy or colonoscopy. You may have a sigmoidoscopy every 5 years or a colonoscopy every 10 years starting at age 76. Hepatitis C blood test. Hepatitis B blood test. Sexually transmitted disease (STD) testing. Diabetes screening. This is done by checking your blood sugar (glucose) after you have not eaten for a while (fasting). You may have this done every 1-3 years. Bone density scan. This is done to screen for osteoporosis. You may have this done starting at age 76. Mammogram. This may be done every 1-2 years. Talk to your health care provider about how often you should have regular mammograms. Talk with your health care provider about your test results, treatment options, and if necessary, the need for more tests. Vaccines  Your health care provider may recommend certain vaccines, such as: Influenza vaccine. This is recommended every year. Tetanus, diphtheria, and acellular pertussis (Tdap, Td) vaccine. You may need a Td booster every 10 years. Zoster vaccine. You may need this after age 76. Pneumococcal 13-valent conjugate (PCV13) vaccine. One dose is  recommended after age  76. Pneumococcal polysaccharide (PPSV23) vaccine. One dose is recommended after age 10. Talk to your health care provider about which screenings and vaccines you need and how often you need them. This information is not intended to replace advice given to you by your health care provider. Make sure you discuss any questions you have with your health care provider. Document Released: 09/19/2015 Document Revised: 05/12/2016 Document Reviewed: 06/24/2015 Elsevier Interactive Patient Education  2017 [employer] Prevention in the Home Falls can cause injuries. They can happen to people of all ages. There are many things you can do to make your home safe and to help prevent falls. What can I do on the outside of my home? Regularly fix the edges of walkways and driveways and fix any cracks. Remove anything that might make you trip as you walk through a door, such as a raised step or threshold. Trim any bushes or trees on the path to your home. Use bright outdoor lighting. Clear any walking paths of anything that might make someone trip, such as rocks or tools. Regularly check to see if handrails are loose or broken. Make sure that both sides of any steps have handrails. Any raised decks and porches should have guardrails on the edges. Have any leaves, snow, or ice cleared regularly. Use sand or salt on walking paths during winter. Clean up any spills in your garage right away. This includes oil or grease spills. What can I do in the bathroom? Use night lights. Install grab bars by the toilet and in the tub and shower. Do not use towel bars as grab bars. Use non-skid mats or decals in the tub or shower. If you need to sit down in the shower, use a plastic, non-slip stool. Keep the floor dry. Clean up any water that spills on the floor as soon as it happens. Remove soap buildup in the tub or shower regularly. Attach bath mats securely with double-sided non-slip rug tape. Do not have throw  rugs and other things on the floor that can make you trip. What can I do in the bedroom? Use night lights. Make sure that you have a light by your bed that is easy to reach. Do not use any sheets or blankets that are too big for your bed. They should not hang down onto the floor. Have a firm chair that has side arms. You can use this for support while you get dressed. Do not have throw rugs and other things on the floor that can make you trip. What can I do in the kitchen? Clean up any spills right away. Avoid walking on wet floors. Keep items that you use a lot in easy-to-reach places. If you need to reach something above you, use a strong step stool that has a grab bar. Keep electrical cords out of the way. Do not use floor polish or wax that makes floors slippery. If you must use wax, use non-skid floor wax. Do not have throw rugs and other things on the floor that can make you trip. What can I do with my stairs? Do not leave any items on the stairs. Make sure that there are handrails on both sides of the stairs and use them. Fix handrails that are broken or loose. Make sure that handrails are as long as the stairways. Check any carpeting to make sure that it is firmly attached to the stairs. Fix any carpet that is loose or worn. Avoid having  throw rugs at the top or bottom of the stairs. If you do have throw rugs, attach them to the floor with carpet tape. Make sure that you have a light switch at the top of the stairs and the bottom of the stairs. If you do not have them, ask someone to add them for you. What else can I do to help prevent falls? Wear shoes that: Do not have high heels. Have rubber bottoms. Are comfortable and fit you well. Are closed at the toe. Do not wear sandals. If you use a stepladder: Make sure that it is fully opened. Do not climb a closed stepladder. Make sure that both sides of the stepladder are locked into place. Ask someone to hold it for you, if  possible. Clearly mark and make sure that you can see: Any grab bars or handrails. First and last steps. Where the edge of each step is. Use tools that help you move around (mobility aids) if they are needed. These include: Canes. Walkers. Scooters. Crutches. Turn on the lights when you go into a dark area. Replace any light bulbs as soon as they burn out. Set up your furniture so you have a clear path. Avoid moving your furniture around. If any of your floors are uneven, fix them. If there are any pets around you, be aware of where they are. Review your medicines with your doctor. Some medicines can make you feel dizzy. This can increase your chance of falling. Ask your doctor what other things that you can do to help prevent falls. This information is not intended to replace advice given to you by your health care provider. Make sure you discuss any questions you have with your health care provider. Document Released: 06/19/2009 Document Revised: 01/29/2016 Document Reviewed: 09/27/2014 Elsevier Interactive Patient Education  2017 [employer].

## 2021-03-24 NOTE — Progress Notes (Signed)
I connected with Natasha Curry today by telephone and verified that I am speaking with the correct person using two identifiers. Location patient: home Location provider: work Persons participating in the virtual visit: [name], Yetter LPN.   I discussed the limitations, risks, security and privacy concerns of performing an evaluation and management service by telephone and the availability of in person appointments. I also discussed with the patient that there may be a patient responsible charge related to this service. The patient expressed understanding and verbally consented to this telephonic visit.    Interactive audio and video telecommunications were attempted between this provider and patient, however failed, due to patient having technical difficulties OR patient did not have access to video capability.  We continued and completed visit with audio only.     Vital signs may be patient reported or missing.  Subjective:   Natasha Curry is a 75 y.o. female who presents for an Initial Medicare Annual Wellness Visit.  Review of Systems     Cardiac Risk Factors include: advanced age (>35men, >78 women);hypertension;obesity (BMI >30kg/m2);sedentary lifestyle     Objective:    Today's Vitals   03/24/21 1057  Weight: 280 lb (127 kg)  Height: 5\' 3"  (1.6 m)   Body mass index is 49.6 kg/m.  Advanced Directives 03/24/2021 04/07/2020  Does Patient Have a Medical Advance Directive? Yes No  Type of Paramedic of [place];Living will -  Copy of [place] in Chart? No - copy requested -  Would patient like information on creating a medical advance directive? - No - Patient declined    Current Medications (verified) Outpatient Encounter Medications as of 03/24/2021  Medication Sig   atorvastatin (LIPITOR) 10 MG tablet TAKE 1 TABLET BY MOUTH EVERY DAY   ciclopirox (LOPROX) 0.77 % cream Apply topically 2 (two) times daily. 2-4 weeks.    diclofenac Sodium (VOLTAREN) 1 % GEL Apply 2 g topically 4 (four) times daily as needed.   escitalopram (LEXAPRO) 20 MG tablet TAKE 1 TABLET BY MOUTH EVERY DAY   fluticasone (FLONASE) 50 MCG/ACT nasal spray PLACE 2 SPRAYS INTO BOTH NOSTRILS DAILY. USE FOR 4-6 WEEKS THEN STOP AND USE SEASONALLY OR AS NEEDED.   HYDROcodone-acetaminophen (NORCO/VICODIN) 5-325 MG tablet Take 1 tablet by mouth every 8 (eight) hours as needed.   lidocaine (LIDODERM) 5 % Place 1 patch onto the skin every 12 (twelve) hours. Remove & Discard patch within 12 hours or as directed by MD   methocarbamol (ROBAXIN) 750 MG tablet Take 2 tablets (1,500 mg total) by mouth every 6 (six) hours as needed for muscle spasms.   metoprolol succinate (TOPROL-XL) 25 MG 24 hr tablet TAKE 1/2 TABLET BY MOUTH EVERY DAY   omeprazole (PRILOSEC) 40 MG capsule TAKE 1 CAPSULE BY MOUTH EVERY DAY   triamcinolone cream (KENALOG) 0.5 % Apply 1 application topically 2 (two) times daily. To affected areas, for up to 2 weeks.   No facility-administered encounter medications on file as of 03/24/2021.    Allergies (verified) Patient has no known allergies.   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   Colon polyp    Depression    Eczema    GERD (gastroesophageal reflux disease)    Kidney stones    Psoriasis    Sleep apnea    Tremor    Past Surgical History:  Procedure Laterality Date  CHOLECYSTECTOMY  2000   GASTRIC BYPASS  2013   TOTAL KNEE ARTHROPLASTY  2014   Family History  Problem Relation Age of Onset   Cancer Mother        adrenal   Heart attack Father    Heart disease Father    Stroke Father    Breast cancer Neg Hx    Social History   Socioeconomic History   Marital status: Married    Spouse name: Not on file   Number of children: Not on file   Years of education: Not on file   Highest education level: Not on file  Occupational History   Not on file   Tobacco Use   Smoking status: Never   Smokeless tobacco: Never  Vaping Use   Vaping Use: Never used  Substance and Sexual Activity   Alcohol use: Yes    Alcohol/week: 3.0 standard drinks    Types: 3 Standard drinks or equivalent per week   Drug use: Never   Sexual activity: Not on file  Other Topics Concern   Not on file  Social History Narrative   Not on file   Social Determinants of Health   Financial Resource Strain: Low Risk    Difficulty of Paying Living Expenses: Not hard at all  Food Insecurity: No Food Insecurity   Worried About Charity fundraiser in the Last Year: Never true   [place] in the Last Year: Never true  Transportation Needs: No Transportation Needs   Lack of Transportation (Medical): No   Lack of Transportation (Non-Medical): No  Physical Activity: Inactive   Days of Exercise per Week: 0 days   Minutes of Exercise per Session: 0 min  Stress: No Stress Concern Present   Feeling of Stress : Not at all  Social Connections: Not on file    Tobacco Counseling Counseling given: Not Answered   Clinical Intake:  Pre-visit preparation completed: Yes  Pain : No/denies pain     Nutritional Status: BMI > 30  Obese Nutritional Risks: None Diabetes: No  How often do you need to have someone help you when you read instructions, pamphlets, or other written materials from your doctor or pharmacy?: 1 - Never What is the last grade level you completed in school?: 12th grade  Diabetic? no  Interpreter Needed?: No  Information entered by :: NAllen LPN   Activities of Daily Living In your present state of health, do you have any difficulty performing the following activities: 03/24/2021  Hearing? Y  Comment decreased in left ear  Vision? N  Difficulty concentrating or making decisions? N  Walking or climbing stairs? Y  Dressing or bathing? Y  Doing errands, shopping? Y  Preparing Food and eating ? Y  Comment due to shoulder  Using the  Toilet? N  In the past six months, have you accidently leaked urine? N  Comment once  Do you have problems with loss of bowel control? N  Managing your Medications? N  Managing your Finances? N  Housekeeping or managing your Housekeeping? Y  Comment due to shoulder  Some recent data might be hidden    Patient Care Team: Olin Hauser, DO as PCP - General (Family Medicine)  Indicate any recent Medical Services you may have received from other than [facility] providers in the past year (date may be approximate).     Assessment:   This is a routine wellness examination for Eimi.  Hearing/Vision screen Vision Screening - Comments::  No regular eye exams,  Dietary issues and exercise activities discussed: Current Exercise Habits: The patient does not participate in regular exercise at present   Goals Addressed             This Visit's Progress    Patient Stated       03/24/2021, use arm again       Depression Screen PHQ 2/9 Scores 03/24/2021 02/10/2021 01/07/2021 11/27/2019 04/24/2019  PHQ - 2 Score 0 0 0 0 1  PHQ- 9 Score - 1 1 - 10    Fall Risk Fall Risk  03/24/2021 02/10/2021 10/09/2020 11/27/2019 09/25/2019  Falls in the past year? 1 0 0 0 0  Comment slipped in the yard - - - -  Number falls in past yr: 0 0 - 0 0  Injury with Fall? 1 0 - 0 0  Risk for fall due to : Medication side effect - - - No Fall Risks  Follow up Falls evaluation completed;Education provided;Falls prevention discussed Falls evaluation completed - Falls evaluation completed -    FALL RISK PREVENTION PERTAINING TO THE HOME:  Any stairs in or around the home? Yes  If so, are there any without handrails? No  Home free of loose throw rugs in walkways, pet beds, electrical cords, etc? Yes  Adequate lighting in your home to reduce risk of falls? Yes   ASSISTIVE DEVICES UTILIZED TO PREVENT FALLS:  Life alert? No  Use of a cane, walker or w/c? No  Grab bars in the bathroom? Yes  Shower chair or  bench in shower? No  Elevated toilet seat or a handicapped toilet? No   TIMED UP AND GO:  Was the test performed? No .      Cognitive Function:     6CIT Screen 03/24/2021  What Year? 0 points  What month? 0 points  What time? 3 points  Count back from 20 0 points  Months in reverse 2 points  Repeat phrase 2 points  Total Score 7    Immunizations Immunization History  Administered Date(s) Administered   Influenza, High Dose Seasonal PF 05/12/2019, 05/29/2020   PFIZER(Purple Top)SARS-COV-2 Vaccination 10/22/2019, 11/12/2019, 06/15/2020   Pneumococcal Conjugate-13 12/15/2018   Zoster Recombinat (Shingrix) 12/15/2018, 02/14/2019    TDAP status: Due, Education has been provided regarding the importance of this vaccine. Advised may receive this vaccine at local pharmacy or Health Dept. Aware to provide a copy of the vaccination record if obtained from local pharmacy or Health Dept. Verbalized acceptance and understanding.  Flu Vaccine status: Up to date  Pneumococcal vaccine status: Up to date  Covid-19 vaccine status: Completed vaccines  Qualifies for Shingles Vaccine? Yes   Zostavax completed No   Shingrix Completed?: Yes  Screening Tests Health Maintenance  Topic Date Due   COVID-19 Vaccine (4 - Booster for Pfizer series) 09/15/2020   TETANUS/TDAP  02/11/2024 (Originally 02/14/1965)   Hepatitis C Screening  02/11/2024 (Originally 02/14/1964)   INFLUENZA VACCINE  04/06/2021   DEXA SCAN  Completed   PNA vac Low Risk Adult  Completed   Zoster Vaccines- Shingrix  Completed   HPV VACCINES  Aged Out    Health Maintenance  Health Maintenance Due  Topic Date Due   COVID-19 Vaccine (4 - Booster for [place] series) 09/15/2020    Colorectal cancer screening: No longer required.   Mammogram status: Completed 11/05/2020. Repeat every year  Bone Density status: Completed 09/06/2018.   Lung Cancer Screening: (Low Dose CT Chest recommended if Age 43-80  years, 30 pack-year  currently smoking OR have quit w/in 15years.) does not qualify.   Lung Cancer Screening Referral: no  Additional Screening:  Hepatitis C Screening: does qualify; due  Vision Screening: Recommended annual ophthalmology exams for early detection of glaucoma and other disorders of the eye. Is the patient up to date with their annual eye exam?  No  Who is the provider or what is the name of the office in which the patient attends annual eye exams? none If pt is not established with a provider, would they like to be referred to a provider to establish care? No .   Dental Screening: Recommended annual dental exams for proper oral hygiene  Community Resource Referral / Chronic Care Management: CRR required this visit?  No   CCM required this visit?  No      Plan:     I have personally reviewed and noted the following in the patient's chart:   Medical and social history Use of alcohol, tobacco or illicit drugs  Current medications and supplements including opioid prescriptions. Patient is currently taking opioid prescriptions. Information provided to patient regarding non-opioid alternatives. Patient advised to discuss non-opioid treatment plan with their provider. Functional ability and status Nutritional status Physical activity Advanced directives List of other physicians Hospitalizations, surgeries, and ER visits in previous 12 months Vitals Screenings to include cognitive, depression, and falls Referrals and appointments  In addition, I have reviewed and discussed with patient certain preventive protocols, quality metrics, and best practice recommendations. A written personalized care plan for preventive services as well as general preventive health recommendations were provided to patient.     Kellie Simmering, LPN   [id number]   Nurse Notes:

## 2021-04-15 ENCOUNTER — Other Ambulatory Visit: Payer: Self-pay | Admitting: Family Medicine

## 2021-04-15 DIAGNOSIS — F411 Generalized anxiety disorder: Secondary | ICD-10-CM

## 2021-04-15 NOTE — Telephone Encounter (Signed)
Patient due 6 month follow up in December- Rx granted

## 2021-04-15 NOTE — Telephone Encounter (Signed)
Wellness visit- 02/10/21- patient GAD not addressed at this visit- patient has been on medication long term- due f/u 12/22. Needs depression screen updated at next appointment

## 2021-05-06 ENCOUNTER — Other Ambulatory Visit: Payer: Self-pay | Admitting: Family Medicine

## 2021-05-06 DIAGNOSIS — I10 Essential (primary) hypertension: Secondary | ICD-10-CM

## 2021-05-06 NOTE — Telephone Encounter (Signed)
Most recent BP reading- outside provider- 03/18/21 134/60

## 2021-05-13 ENCOUNTER — Ambulatory Visit: Payer: Self-pay

## 2021-05-13 ENCOUNTER — Ambulatory Visit: Payer: Self-pay | Admitting: *Deleted

## 2021-05-13 ENCOUNTER — Other Ambulatory Visit: Payer: Self-pay | Admitting: Family Medicine

## 2021-05-13 ENCOUNTER — Other Ambulatory Visit: Payer: Self-pay

## 2021-05-13 DIAGNOSIS — U071 COVID-19: Secondary | ICD-10-CM

## 2021-05-13 MED ORDER — NIRMATRELVIR/RITONAVIR (PAXLOVID)TABLET: 3.0000 | ORAL_TABLET | Freq: Two times a day (BID) | ORAL | 0 refills | Status: AC

## 2021-05-13 NOTE — Telephone Encounter (Signed)
Per agent:  "Pt tested positive this am for covid.  She has 101 temp, ear ache, body aches, fatigue, cough,. Husband tested positive yesterday and Dr Raliegh Ip prescribed the anti virals.  She would like Rx as well."  Pt reports tested positive covid ths Am, home test. Husband tested positive yesterday. Pt reports fever of 101.0, cough productive for clear phlegm, headache, earaches, body aches.Onset of symptoms last night.  Has not taken anything for symptoms as of yet. Vaccinated x 2 , 1 booster. Denies any SOB, no CP, no wheezing. Pt requesting Paxlovid, husband prescribed yesterday. Home care advise given, declined review of self isolation guidelines. Reviewed symptoms that warrant an ED eval. Assured pt NT would route to practice for PCPs review and final disposition. Pt verbalizes understanding.

## 2021-05-13 NOTE — Telephone Encounter (Signed)
Pt tested positive this am for covid.  She has 101 temp, ear ache, body aches, fatigue, cough,. Husband tested positive yesterday and Dr Raliegh Ip prescribed the anti virals.  She would like Rx as well.   Reason for Disposition  [1] Fever > 101 F (38.3 C) AND [2] age > 60 years  Answer Assessment - Initial Assessment Questions 1. COVID-19 DIAGNOSIS: "Who made your COVID-19 diagnosis?" "Was it confirmed by a positive lab test or self-test?" If not diagnosed by a doctor (or NP/PA), ask "Are there lots of cases (community spread) where you live?" Note: See public health department website, if unsure.     Home this AM 2. COVID-19 EXPOSURE: "Was there any known exposure to COVID before the symptoms began?" CDC Definition of close contact: within 6 feet (2 meters) for a total of 15 minutes or more over a 24-hour period.      Husband 3. ONSET: "When did the COVID-19 symptoms start?"      Last night 4. WORST SYMPTOM: "What is your worst symptom?" (e.g., cough, fever, shortness of breath, muscle aches)      5. COUGH: "Do you have a cough?" If Yes, ask: "How bad is the cough?"        6. FEVER: "Do you have a fever?" If Yes, ask: "What is your temperature, how was it measured, and when did it start?"     101.0 7. RESPIRATORY STATUS: "Describe your breathing?" (e.g., shortness of breath, wheezing, unable to speak)      none 8. BETTER-SAME-WORSE: "Are you getting better, staying the same or getting worse compared to yesterday?"  If getting worse, ask, "In what way?"     worse 9. HIGH RISK DISEASE: "Do you have any chronic medical problems?" (e.g., asthma, heart or lung disease, weak immune system, obesity, etc.)      10. VACCINE: "Have you had the COVID-19 vaccine?" If Yes, ask: "Which one, how many shots, when did you get it?"       2,Pfizer 11. BOOSTER: "Have you received your COVID-19 booster?" If Yes, ask: "Which one and when did you get it?"       1  13. OTHER SYMPTOMS: "Do you have any other  symptoms?"  (e.g., chills, fatigue, headache, loss of smell or taste, muscle pain, sore throat)       Body aches, sore throat, fever, headache, earache 14. O2 SATURATION MONITOR:  "Do you use an oxygen saturation monitor (pulse oximeter) at home?" If Yes, ask "What is your reading (oxygen level) today?" "What is your usual oxygen saturation reading?" (e.g., 95%)  Protocols used: Coronavirus (COVID-19) Diagnosed or Suspected-A-AH

## 2021-05-13 NOTE — Telephone Encounter (Signed)
Agreed to send rx Paxlovid to her pharmacy.  Please contact her Thursday 9/8 morning to notify her of new rx sent and that she should HOLD Atorvastatin for 5 days while on medicine  Also she should be cautious with Hydrocodone pain medicine and take it only if truly needed, the interaction will make the hydrocodone stronger temporarily and she should be cautious with taking it too often.  Nobie Putnam, [facility] 05/13/2021, 6:12 PM

## 2021-05-13 NOTE — Telephone Encounter (Signed)
Patient called and advised Paxlovid was sent to [facility], she verbalized understanding.   Message from Bayard Beaver sent at 05/13/2021  4:57 PM EDT  Summary: covid postitive   Patient called tested positive for covid today. She is asking for meds while recovering

## 2021-05-13 NOTE — Telephone Encounter (Signed)
Patient's husband called and advised of the below from Dr. Parks Ranger, he verbalized understanding.

## 2021-06-12 ENCOUNTER — Other Ambulatory Visit: Payer: Self-pay | Admitting: Family Medicine

## 2021-06-12 DIAGNOSIS — K219 Gastro-esophageal reflux disease without esophagitis: Secondary | ICD-10-CM

## 2021-06-19 ENCOUNTER — Other Ambulatory Visit: Payer: Self-pay | Admitting: Family Medicine

## 2021-06-19 DIAGNOSIS — E782 Mixed hyperlipidemia: Secondary | ICD-10-CM

## 2021-06-19 NOTE — Telephone Encounter (Signed)
Requested Prescriptions  Pending Prescriptions Disp Refills  . atorvastatin (LIPITOR) 10 MG tablet [Pharmacy Med Name: ATORVASTATIN 10 MG TABLET] 90 tablet 2    Sig: TAKE 1 TABLET BY MOUTH EVERY DAY     Cardiovascular:  Antilipid - Statins Failed - 06/19/2021  1:21 AM      Failed - LDL in normal range and within 360 days    LDL Cholesterol (Calc)  Date Value Ref Range Status  02/10/2021 100 (H) mg/dL (calc) Final    Comment:    Reference range: <100 . Desirable range <100 mg/dL for primary prevention;   <70 mg/dL for patients with CHD or diabetic patients  with > or = 2 CHD risk factors. Marland Kitchen LDL-C is now calculated using the Martin-Hopkins  calculation, which is a validated novel method providing  better accuracy than the Friedewald equation in the  estimation of LDL-C.  Cresenciano Genre et al. Annamaria Helling. 0881;103(15): 2061-2068  (http://education.Quest[employer].com/faq/FAQ164)          Failed - HDL in normal range and within 360 days    HDL  Date Value Ref Range Status  02/10/2021 31 (L) > OR = 50 mg/dL Final         Failed - Triglycerides in normal range and within 360 days    Triglycerides  Date Value Ref Range Status  02/10/2021 223 (H) <150 mg/dL Final    Comment:    . If a non-fasting specimen was collected, consider repeat triglyceride testing on a fasting specimen if clinically indicated.  Yates Decamp et al. J. of Clin. Lipidol. 9458;5:929-244. Marland Kitchen          Passed - Total Cholesterol in normal range and within 360 days    Cholesterol  Date Value Ref Range Status  02/10/2021 165 <200 mg/dL Final         Passed - Patient is not pregnant      Passed - Valid encounter within last 12 months    Recent Outpatient Visits          3 months ago Tinea corporis   [place], DO   4 months ago Degenerative lumbar spinal stenosis   [place], DO   5 months ago Hearing loss of left ear,  unspecified hearing loss type   [place], DO   9 months ago Chronic bilateral low back pain with left-sided sciatica   [place], DO   1 year ago Degenerative lumbar spinal stenosis   [facility] Olin Hauser, DO      Future Appointments            In 9 months [facility], [facility]

## 2021-07-18 ENCOUNTER — Other Ambulatory Visit: Payer: Self-pay | Admitting: Family Medicine

## 2021-07-18 DIAGNOSIS — F411 Generalized anxiety disorder: Secondary | ICD-10-CM

## 2021-07-18 NOTE — Telephone Encounter (Signed)
Requested Prescriptions  Pending Prescriptions Disp Refills  . escitalopram (LEXAPRO) 20 MG tablet [Pharmacy Med Name: ESCITALOPRAM 20 MG TABLET] 90 tablet 0    Sig: TAKE 1 TABLET BY MOUTH EVERY DAY     Psychiatry:  Antidepressants - SSRI Passed - 07/18/2021 12:14 PM      Passed - Valid encounter within last 6 months    Recent Outpatient Visits          4 months ago Tinea corporis   [place], DO   5 months ago Degenerative lumbar spinal stenosis   [place], DO   6 months ago Hearing loss of left ear, unspecified hearing loss type   [place], DO   10 months ago Chronic bilateral low back pain with left-sided sciatica   [place], DO   1 year ago Degenerative lumbar spinal stenosis   [place], Devonne Doughty, DO      Future Appointments            In 8 months [facility], [facility]

## 2021-09-08 ENCOUNTER — Telehealth: Payer: Self-pay | Admitting: Family Medicine

## 2021-09-08 DIAGNOSIS — L309 Dermatitis, unspecified: Secondary | ICD-10-CM

## 2021-09-08 NOTE — Telephone Encounter (Signed)
Referral entered.  Copied from [place] [phone]. Topic: Referral - Request for Referral >> Sep 08, 2021  1:37 PM Fields, [employer] R wrote: Has patient seen PCP for this complaint? No. *If NO, is insurance requiring patient see PCP for this issue before PCP can refer them? Referral for which specialty: dermatologist  Preferred provider/office: as long as it's a woman Reason for referral: rash, eczema under breast  Only available for call back on Wednesday

## 2021-10-20 ENCOUNTER — Other Ambulatory Visit: Payer: Self-pay | Admitting: Family Medicine

## 2021-10-20 DIAGNOSIS — F411 Generalized anxiety disorder: Secondary | ICD-10-CM

## 2021-10-20 NOTE — Telephone Encounter (Signed)
Courtesy refill . Future visit in 5 months . Requested Prescriptions  Pending Prescriptions Disp Refills   escitalopram (LEXAPRO) 20 MG tablet [Pharmacy Med Name: ESCITALOPRAM 20 MG TABLET] 90 tablet 0    Sig: TAKE 1 TABLET BY MOUTH EVERY DAY     Psychiatry:  Antidepressants - SSRI Failed - 10/20/2021  1:50 AM      Failed - Valid encounter within last 6 months    Recent Outpatient Visits          7 months ago Tinea corporis   [place], DO   8 months ago Degenerative lumbar spinal stenosis   [place], DO   9 months ago Hearing loss of left ear, unspecified hearing loss type   [place], DO   1 year ago Chronic bilateral low back pain with left-sided sciatica   [place], DO   1 year ago Degenerative lumbar spinal stenosis   [place], Devonne Doughty, DO      Future Appointments            In 5 months [facility], [facility]

## 2021-11-18 ENCOUNTER — Other Ambulatory Visit: Payer: Self-pay | Admitting: Family Medicine

## 2021-11-18 DIAGNOSIS — Z792 Long term (current) use of antibiotics: Secondary | ICD-10-CM

## 2021-11-18 MED ORDER — AMOXICILLIN 500 MG PO CAPS: ORAL_CAPSULE | ORAL | 0 refills | Status: DC

## 2021-12-20 ENCOUNTER — Other Ambulatory Visit: Payer: Self-pay | Admitting: Family Medicine

## 2021-12-20 DIAGNOSIS — K219 Gastro-esophageal reflux disease without esophagitis: Secondary | ICD-10-CM

## 2021-12-21 ENCOUNTER — Other Ambulatory Visit: Payer: Self-pay | Admitting: Family Medicine

## 2021-12-21 DIAGNOSIS — I10 Essential (primary) hypertension: Secondary | ICD-10-CM

## 2021-12-21 NOTE — Telephone Encounter (Signed)
Pt called and was able to schedule appt for this week. Will go ahead and fill Toprol XL. ?

## 2021-12-21 NOTE — Telephone Encounter (Signed)
Requested Prescriptions  ?Pending Prescriptions Disp Refills  ?? metoprolol succinate (TOPROL-XL) 25 MG 24 hr tablet [Pharmacy Med Name: METOPROLOL SUCC ER 25 MG TAB] 45 tablet 0  ?  Sig: TAKE 1/2 TABLET BY MOUTH EVERY DAY  ?  ? Cardiovascular:  Beta Blockers Failed - 12/21/2021  2:28 AM  ?  ?  Failed - Last BP in normal range  ?  BP Readings from Last 1 Encounters:  ?02/19/21 (!) 202/96  ?   ?  ?  Failed - Valid encounter within last 6 months  ?  Recent Outpatient Visits   ?      ? 9 months ago Tinea corporis  ? [place], DO  ? 10 months ago Degenerative lumbar spinal stenosis  ? [place], DO  ? 11 months ago Hearing loss of left ear, unspecified hearing loss type  ? [place], DO  ? 1 year ago Chronic bilateral low back pain with left-sided sciatica  ? [place], DO  ? 1 year ago Degenerative lumbar spinal stenosis  ? [place], DO  ?  ?  ?Future Appointments   ?        ? In 2 days Baity, Coralie Keens, NP [facility], [place]  ? In 3 months  [place]  ?  ? ?  ?  ?  Passed - Last Heart Rate in normal range  ?  Pulse Readings from Last 1 Encounters:  ?02/19/21 80  ?   ?  ?  ? ? ?

## 2021-12-21 NOTE — Telephone Encounter (Signed)
Requested Prescriptions  ?Pending Prescriptions Disp Refills  ?? omeprazole (PRILOSEC) 40 MG capsule [Pharmacy Med Name: OMEPRAZOLE DR 40 MG CAPSULE] 90 capsule 1  ?  Sig: TAKE 1 CAPSULE BY MOUTH EVERY DAY  ?  ? Gastroenterology: Proton Pump Inhibitors Passed - 12/20/2021  9:21 AM  ?  ?  Passed - Valid encounter within last 12 months  ?  Recent Outpatient Visits   ?      ? 9 months ago Tinea corporis  ? [place], DO  ? 10 months ago Degenerative lumbar spinal stenosis  ? [place], DO  ? 11 months ago Hearing loss of left ear, unspecified hearing loss type  ? [place], DO  ? 1 year ago Chronic bilateral low back pain with left-sided sciatica  ? [place], DO  ? 1 year ago Degenerative lumbar spinal stenosis  ? [place], DO  ?  ?  ?Future Appointments   ?        ? In 3 months [place] [facility], [place]   ?  ? ?  ?  ?  ? ?

## 2021-12-23 ENCOUNTER — Ambulatory Visit: Payer: Medicare Other | Admitting: Internal Medicine

## 2021-12-23 ENCOUNTER — Ambulatory Visit (INDEPENDENT_AMBULATORY_CARE_PROVIDER_SITE_OTHER): Payer: Medicare Other | Admitting: Family Medicine

## 2021-12-23 ENCOUNTER — Other Ambulatory Visit: Payer: Self-pay | Admitting: Family Medicine

## 2021-12-23 ENCOUNTER — Encounter: Payer: Self-pay | Admitting: Family Medicine

## 2021-12-23 VITALS — BP 134/86 | HR 70 | Ht 63.0 in | Wt 283.0 lb

## 2021-12-23 DIAGNOSIS — I10 Essential (primary) hypertension: Secondary | ICD-10-CM

## 2021-12-23 DIAGNOSIS — E782 Mixed hyperlipidemia: Secondary | ICD-10-CM

## 2021-12-23 DIAGNOSIS — Z6841 Body Mass Index (BMI) 40.0 and over, adult: Secondary | ICD-10-CM

## 2021-12-23 DIAGNOSIS — R7309 Other abnormal glucose: Secondary | ICD-10-CM

## 2021-12-23 DIAGNOSIS — G4733 Obstructive sleep apnea (adult) (pediatric): Secondary | ICD-10-CM

## 2021-12-23 DIAGNOSIS — F411 Generalized anxiety disorder: Secondary | ICD-10-CM

## 2021-12-23 DIAGNOSIS — Z9989 Dependence on other enabling machines and devices: Secondary | ICD-10-CM

## 2021-12-23 MED ORDER — METOPROLOL SUCCINATE ER 25 MG PO TB24: 12.5000 mg | ORAL_TABLET | Freq: Every day | ORAL | 3 refills | Status: DC

## 2021-12-23 MED ORDER — ESCITALOPRAM OXALATE 20 MG PO TABS: 20.0000 mg | ORAL_TABLET | Freq: Every day | ORAL | 3 refills | Status: DC

## 2021-12-23 NOTE — Patient Instructions (Addendum)
Thank you for coming to the office today. ? ?Try an emotional eating checklist before snacking or binging / eating. ? ? ?STOP - Think before you eat ?Am I hungry right now? ?What am I feeling right now? ?When is the next planned meal? ?How will I feel after I eat this? ?Is there something else I should eat instead? ? ? ? ?Referral back to Pulmonology to management of OSA, previously seen in 2021. ? ?[facility] ?[address] ?[place], [place] ?Phone: [phone] ? ?----------------------------------------------- ? ?[facility] ?[facility] ?[address] ?[place], [place] [address] ?P: [phone] ? ?[facility] ?[address] ?[place], [place] [address] ?[phone] ? ?[facility], DDS ?[address] ?[place], [place] [address] ?[phone] ? ?[place] ?[place] ?[place], [place] [address] ?[phone] ? ?[facility] ?[place] [address] ?[place], [place] [address] ?[phone] ? ?[facility] ?[place] ?[place], [place] [address] ?[phone] ? ? ? ? ? ?Please schedule a Follow-up Appointment to: Return in about 3 months (around 03/24/2022) for 3 month fasting lab only then 1 week later Follow-up Lab results, Weight. ? ?If you have any other questions or concerns, please feel free to call the office or send a message through [place]. You may also schedule an earlier appointment if necessary. ? ?Additionally, you may be receiving a survey about your experience at our office within a few days to 1 week by e-mail or mail. We value your feedback. ? ?Nobie Putnam, DO ?[facility] ?

## 2021-12-23 NOTE — Progress Notes (Signed)
? ?Subjective:  ? ? Patient ID: Natasha Curry, female    DOB: [birth year], 76 y.o.   MRN: [id number] ? ?Natasha Curry is a 76 y.o. female presenting on 12/23/2021 for Hypertension ? ? ?HPI ? ?Morbid Obesity BMI >50 ? ?She tried GOLO diet and she was able to lose 8-9 lbs. And did well then restarted old habits of binge eating and sedentary watching TV and gained the weight all back. ?Now needs assistance for weight management, goal to lose again with diet. ? ?Chronic Low Back Pain, Lumbar DJD ?Lumbar Spinal Stenosis ?Followed by [facility], has had treatments and procedure in past. ?Now followed by Allyson Sabal - NP and is managed on Methocarbamol '1500mg'$  BID currently with great results. ?  ?OSA on CPAP ?Uses CPAP nightly, adheres to therapy, improves her sleep, she benefits from it. Last machine update 5 years ago, had seen [facility] would like to return to them for new machine orders. ? ?Anxiety ?Controlled on Lexapro '20mg'$  daily. Sent reorder. ? ? ? ?  12/23/2021  ?  8:27 AM 03/24/2021  ? 11:09 AM 02/10/2021  ? 10:49 AM  ?Depression screen PHQ 2/9  ?Decreased Interest 0 0 0  ?Down, Depressed, Hopeless 0 0 0  ?PHQ - 2 Score 0 0 0  ?Altered sleeping 1  0  ?Tired, decreased energy 1  1  ?Change in appetite 1  0  ?Feeling bad or failure about yourself  0  0  ?Trouble concentrating 0  0  ?Moving slowly or fidgety/restless 0  0  ?Suicidal thoughts 0  0  ?PHQ-9 Score 3  1  ?Difficult doing work/chores Not difficult at all  Not difficult at all  ? ? ?Social History  ? ?Tobacco Use  ? Smoking status: Never  ? Smokeless tobacco: Never  ?Vaping Use  ? Vaping Use: Never used  ?Substance Use Topics  ? Alcohol use: Yes  ?  Alcohol/week: 3.0 standard drinks  ?  Types: 3 Standard drinks or equivalent per week  ? Drug use: Never  ? ? ?Review of Systems ?Per HPI unless specifically indicated above ? ?   ?Objective:  ?  ?BP 140/86   Pulse 70   Ht '5\' 3"'$  (1.6 m)   Wt 283 lb (128.4 kg)   SpO2 99%   BMI 50.13 kg/m?   ?Wt  Readings from Last 3 Encounters:  ?12/23/21 283 lb (128.4 kg)  ?03/24/21 280 lb (127 kg)  ?03/18/21 280 lb (127 kg)  ?  ?Physical Exam ?Vitals and nursing note reviewed.  ?Constitutional:   ?   General: She is not in acute distress. ?   Appearance: Normal appearance. She is well-developed. She is obese. She is not diaphoretic.  ?   Comments: Well-appearing, comfortable, cooperative  ?HENT:  ?   Head: Normocephalic and atraumatic.  ?Eyes:  ?   General:     ?   Right eye: No discharge.     ?   Left eye: No discharge.  ?   Conjunctiva/sclera: Conjunctivae normal.  ?Cardiovascular:  ?   Rate and Rhythm: Normal rate.  ?Pulmonary:  ?   Effort: Pulmonary effort is normal.  ?Skin: ?   General: Skin is warm and dry.  ?   Findings: No erythema or rash.  ?Neurological:  ?   Mental Status: She is alert and oriented to person, place, and time.  ?Psychiatric:     ?   Mood and Affect: Mood normal.     ?  Behavior: Behavior normal.     ?   Thought Content: Thought content normal.  ?   Comments: Well groomed, good eye contact, normal speech and thoughts  ? ? ? ? ?Results for orders placed or performed in visit on 02/10/21  ?Hemoglobin A1c  ?Result Value Ref Range  ? Hgb A1c MFr Bld 6.7 (H) <5.7 % of total Hgb  ? Mean Plasma Glucose 146 mg/dL  ? eAG (mmol/L) 8.1 mmol/L  ?CBC with Differential/Platelet  ?Result Value Ref Range  ? WBC 7.3 3.8 - 10.8 Thousand/uL  ? RBC 4.34 3.80 - 5.10 Million/uL  ? Hemoglobin 12.8 11.7 - 15.5 g/dL  ? HCT 39.7 35.0 - 45.0 %  ? MCV 91.5 80.0 - 100.0 fL  ? MCH 29.5 27.0 - 33.0 pg  ? MCHC 32.2 32.0 - 36.0 g/dL  ? RDW 12.8 11.0 - 15.0 %  ? Platelets 260 140 - 400 Thousand/uL  ? MPV 10.6 7.5 - 12.5 fL  ? Neutro Abs 3,906 1,500 - 7,800 cells/uL  ? Lymphs Abs 2,533 850 - 3,900 cells/uL  ? Absolute Monocytes 635 200 - 950 cells/uL  ? Eosinophils Absolute 168 15 - 500 cells/uL  ? Basophils Absolute 58 0 - 200 cells/uL  ? Neutrophils Relative % 53.5 %  ? Total Lymphocyte 34.7 %  ? Monocytes Relative 8.7 %  ?  Eosinophils Relative 2.3 %  ? Basophils Relative 0.8 %  ?COMPLETE METABOLIC PANEL WITH GFR  ?Result Value Ref Range  ? Glucose, Bld 121 (H) 65 - 99 mg/dL  ? BUN 13 7 - 25 mg/dL  ? Creat 0.76 0.60 - 0.93 mg/dL  ? GFR, Est Non African American 77 > OR = 60 mL/min/1.51m  ? GFR, Est African American 89 > OR = 60 mL/min/1.76m ? BUN/Creatinine Ratio NOT APPLICABLE 6 - 22 (calc)  ? Sodium 142 135 - 146 mmol/L  ? Potassium 4.4 3.5 - 5.3 mmol/L  ? Chloride 108 98 - 110 mmol/L  ? CO2 26 20 - 32 mmol/L  ? Calcium 9.1 8.6 - 10.4 mg/dL  ? Total Protein 6.5 6.1 - 8.1 g/dL  ? Albumin 4.1 3.6 - 5.1 g/dL  ? Globulin 2.4 1.9 - 3.7 g/dL (calc)  ? AG Ratio 1.7 1.0 - 2.5 (calc)  ? Total Bilirubin 0.6 0.2 - 1.2 mg/dL  ? Alkaline phosphatase (APISO) 105 37 - 153 U/L  ? AST 13 10 - 35 U/L  ? ALT 10 6 - 29 U/L  ?Lipid panel  ?Result Value Ref Range  ? Cholesterol 165 <200 mg/dL  ? HDL 31 (L) > OR = 50 mg/dL  ? Triglycerides 223 (H) <150 mg/dL  ? LDL Cholesterol (Calc) 100 (H) mg/dL (calc)  ? Total CHOL/HDL Ratio 5.3 (H) <5.0 (calc)  ? Non-HDL Cholesterol (Calc) 134 (H) <130 mg/dL (calc)  ?TSH  ?Result Value Ref Range  ? TSH 1.27 0.40 - 4.50 mIU/L  ? ?   ?Assessment & Plan:  ? ?Problem List Items Addressed This Visit   ? ? OSA on CPAP  ? Relevant Orders  ? Ambulatory referral to Pulmonology  ? Morbid obesity with BMI of 50.0-59.9, adult ([place]- [facility]  ? GAD (generalized anxiety disorder)  ? Relevant Medications  ? escitalopram (LEXAPRO) 20 MG tablet  ? Essential hypertension  ? Relevant Medications  ? metoprolol succinate (TOPROL-XL) 25 MG 24 hr tablet  ?  ?OSA on CPAP ?Last CPAP machine updated 5 yr ago ?She request new orders and would like  to return to previous Pulmonology. Will need new referral, last time seen 2021. ? ?HTN ?Controlled on current medications including Metoprolol 12.'5mg'$  XL daily, half tab of '25mg'$ , will re order. ? ?Morbid Obesity BMI >50 ?GAD/Binge Eating ?Discussed strategies with emotional eating/binging to discuss  strategy today about emotional inventory prior to eating/snacking. Discussed portion control strategies ?May return to diet weight loss regimen as discussed. ? ?Anxiety ?Controlled on Lexapro '20mg'$ , needs re order ? ?Orders Placed This Encounter  ?Procedures  ? Ambulatory referral to Pulmonology  ?  Referral Priority:   Routine  ?  Referral Type:   Consultation  ?  Referral Reason:   Specialty Services Required  ?  Requested Specialty:   Pulmonary Disease  ?  Number of Visits Requested:   1  ? ? ? ? ?Meds ordered this encounter  ?Medications  ? metoprolol succinate (TOPROL-XL) 25 MG 24 hr tablet  ?  Sig: Take 0.5 tablets (12.5 mg total) by mouth daily.  ?  Dispense:  45 tablet  ?  Refill:  3  ?  Add refills  ? escitalopram (LEXAPRO) 20 MG tablet  ?  Sig: Take 1 tablet (20 mg total) by mouth daily.  ?  Dispense:  90 tablet  ?  Refill:  3  ?  Add refills  ? ? ? ?Follow up plan: ?Return in about 3 months (around 03/24/2022) for 3 month fasting lab only then 1 week later Follow-up Lab results, Weight. ? ?Future labs ordered for Future Labs CMET CBC TSH A1c Lipid ? ? ?Nobie Putnam, DO ?[facility] ?[facility] ?12/23/2021, 8:36 AM ?

## 2022-03-04 ENCOUNTER — Telehealth: Payer: Self-pay

## 2022-03-04 NOTE — Telephone Encounter (Signed)
Lm for patient to request that she bring SD card to appt.

## 2022-03-05 ENCOUNTER — Ambulatory Visit (INDEPENDENT_AMBULATORY_CARE_PROVIDER_SITE_OTHER): Payer: Medicare Other | Admitting: Primary Care

## 2022-03-05 ENCOUNTER — Encounter: Payer: Self-pay | Admitting: Primary Care

## 2022-03-05 DIAGNOSIS — G4733 Obstructive sleep apnea (adult) (pediatric): Secondary | ICD-10-CM | POA: Diagnosis not present

## 2022-03-05 DIAGNOSIS — Z9989 Dependence on other enabling machines and devices: Secondary | ICD-10-CM | POA: Diagnosis not present

## 2022-03-05 NOTE — Progress Notes (Signed)
$'@Patient'X$  ID: Natasha Curry, female    DOB: September 12, 1945, 76 y.o.   MRN: [id number]  Chief Complaint  Patient presents with   Follow-up    Wearing cpap avg 10-12hr nightly- pressure and mask is okay.     Referring provider: Nobie Putnam *  HPI: 76 year old female, never smoked.  Past medical history HTN, OSA on CPAP, obesity  Patient of Dr. Mortimer Fries, last seen by pulmonary nurse practitioner on 02/11/2020.  03/05/2022 Patient presents today for 1 year follow-up OSA.  Maintained on CPAP. She reports compliance with CPAP use. Machine is >5 years. Unable to get download from [facility] card. She wears CPAP faithfully every night for 10-12 hours. She sleeps very well at night. She takes nap during the day. No issues with mask fit or pressure settings. She uses nasal mask. She believes CPAP pressure 6cm h20. Epworth 4/24.    No Known Allergies  Immunization History  Administered Date(s) Administered   Influenza, High Dose Seasonal PF 05/12/2019, 05/29/2020, 08/05/2021   PFIZER(Purple Top)SARS-COV-2 Vaccination 10/22/2019, 11/12/2019, 06/15/2020   Pfizer Covid-19 Vaccine Bivalent Booster 37yr & up 08/16/2021   Pneumococcal Conjugate-13 12/15/2018   Zoster Recombinat (Shingrix) 12/15/2018, 02/14/2019    Past Medical History:  Diagnosis Date   Allergy    Anxiety    [place]    basil cell   Colon polyp    Depression    Eczema    GERD (gastroesophageal reflux disease)    Kidney stones    Psoriasis    Sleep apnea    Tremor     Tobacco History: Social History   Tobacco Use  Smoking Status Never  Smokeless Tobacco Never   Counseling given: Not Answered   Outpatient Medications Prior to Visit  Medication Sig Dispense Refill   amoxicillin (AMOXIL) 500 MG capsule Take 2 capsules ('1000mg'$ ) dose twice a day for 1 day prior to dental work. 4 capsule 0   atorvastatin (LIPITOR) 10 MG tablet TAKE 1 TABLET BY MOUTH EVERY DAY 90 tablet 2   ciclopirox (LOPROX) 0.77 % cream Apply  topically 2 (two) times daily. 2-4 weeks. 30 g 1   diclofenac Sodium (VOLTAREN) 1 % GEL Apply 2 g topically 4 (four) times daily as needed. 100 g 2   escitalopram (LEXAPRO) 20 MG tablet Take 1 tablet (20 mg total) by mouth daily. 90 tablet 3   fluticasone (FLONASE) 50 MCG/ACT nasal spray PLACE 2 SPRAYS INTO BOTH NOSTRILS DAILY. USE FOR 4-6 WEEKS THEN STOP AND USE SEASONALLY OR AS NEEDED. 48 mL 1   HYDROcodone-acetaminophen (NORCO/VICODIN) 5-325 MG tablet Take 1 tablet by mouth every 8 (eight) hours as needed.     methocarbamol (ROBAXIN) 750 MG tablet Take 2 tablets (1,500 mg total) by mouth every 6 (six) hours as needed for muscle spasms.     metoprolol succinate (TOPROL-XL) 25 MG 24 hr tablet Take 0.5 tablets (12.5 mg total) by mouth daily. 45 tablet 3   omeprazole (PRILOSEC) 40 MG capsule TAKE 1 CAPSULE BY MOUTH EVERY DAY 90 capsule 1   triamcinolone cream (KENALOG) 0.5 % Apply 1 application topically 2 (two) times daily. To affected areas, for up to 2 weeks. 30 g 2   No facility-administered medications prior to visit.   Review of Systems  Review of Systems  Constitutional: Negative.   HENT: Negative.    Respiratory: Negative.    Cardiovascular: Negative.   Psychiatric/Behavioral:  Negative for sleep disturbance.     Physical Exam  BP 124/80 (BP Location: Left  Arm, Cuff Size: Large)   Pulse 77   Temp 97.8 F (36.6 C) (Temporal)   Ht '5\' 3"'$  (1.6 m)   Wt 284 lb 6.4 oz (129 kg)   SpO2 97%   BMI 50.38 kg/m  Physical Exam Constitutional:      Appearance: Normal appearance.  HENT:     Head: Normocephalic and atraumatic.     Mouth/Throat:     Mouth: Mucous membranes are moist.     Pharynx: Oropharynx is clear.  Cardiovascular:     Rate and Rhythm: Normal rate and regular rhythm.  Pulmonary:     Effort: Pulmonary effort is normal.     Breath sounds: Normal breath sounds. No wheezing, rhonchi or rales.     Comments: CTA Musculoskeletal:        General: Normal range of motion.      Cervical back: Normal range of motion and neck supple.  Skin:    General: Skin is warm and dry.  Neurological:     General: No focal deficit present.     Mental Status: She is alert and oriented to person, place, and time. Mental status is at baseline.  Psychiatric:        Mood and Affect: Mood normal.        Behavior: Behavior normal.        Thought Content: Thought content normal.        Judgment: Judgment normal.      Lab Results:  CBC    Component Value Date/Time   WBC 7.3 02/10/2021 1121   RBC 4.34 02/10/2021 1121   HGB 12.8 02/10/2021 1121   HCT 39.7 02/10/2021 1121   PLT 260 02/10/2021 1121   MCV 91.5 02/10/2021 1121   MCH 29.5 02/10/2021 1121   MCHC 32.2 02/10/2021 1121   RDW 12.8 02/10/2021 1121   LYMPHSABS 2,533 02/10/2021 1121   EOSABS 168 02/10/2021 1121   BASOSABS 58 02/10/2021 1121    BMET    Component Value Date/Time   NA 142 02/10/2021 1121   K 4.4 02/10/2021 1121   CL 108 02/10/2021 1121   CO2 26 02/10/2021 1121   GLUCOSE 121 (H) 02/10/2021 1121   BUN 13 02/10/2021 1121   CREATININE 0.76 02/10/2021 1121   CALCIUM 9.1 02/10/2021 1121   GFRNONAA 77 02/10/2021 1121   GFRAA 89 02/10/2021 1121    BNP No results found for: "BNP"  ProBNP No results found for: "PROBNP"  Imaging: No results found.   Assessment & Plan:   OSA on CPAP - Hx sleep apnea. Currently on CPAP and reports compliance with use.  Epworth score 4/24. Unable to get download form SD card. Machine is > 15 year old.  She will need to bring SD card into [facility] for compliance report.  Once obtained we will place an order for her to receive new CPAP machine.  Recommend patient continue to wear CPAP every night for minimum 4 to 6 hours.  Encourage weight loss efforts.  Advised against driving experiencing excessive daytime sleepiness or fatigue.  Follow-up in 1 year with Dr. Mortimer Fries or sooner if needed.   Martyn Ehrich, NP 03/05/2022

## 2022-03-05 NOTE — Telephone Encounter (Signed)
Lm x2 for patient.  Will close encounter per office protocol.   

## 2022-03-05 NOTE — Assessment & Plan Note (Signed)
-   Hx sleep apnea. Currently on CPAP and reports compliance with use.  Epworth score 4/24. Unable to get download form SD card. Machine is > 77 year old.  She will need to bring SD card into [facility] for compliance report.  Once obtained we will place an order for her to receive new CPAP machine.  Recommend patient continue to wear CPAP every night for minimum 4 to 6 hours.  Encourage weight loss efforts.  Advised against driving experiencing excessive daytime sleepiness or fatigue.  Follow-up in 1 year with Dr. Mortimer Fries or sooner if needed.

## 2022-03-05 NOTE — Patient Instructions (Addendum)
Recommendations - Continue CPAP every night for minimum 4 to 6 hours or longer - Do not drive experiencing excessive daytime sleepiness or fatigue - Please bring SD card to [facility] so that they can access compliance report, once we get this we can place an order for new machine  Follow-up: 1 year with Dr. Mortimer Fries or sooner if needed    CPAP and BIPAP Information CPAP and BIPAP are methods that use air pressure to keep your airways open and to help you breathe well. CPAP and BIPAP use different amounts of pressure. Your health care provider will tell you whether CPAP or BIPAP would be more helpful for you. CPAP stands for "continuous positive airway pressure." With CPAP, the amount of pressure stays the same while you breathe in (inhale) and out (exhale). BIPAP stands for "bi-level positive airway pressure." With BIPAP, the amount of pressure will be higher when you inhale and lower when you exhale. This allows you to take larger breaths. CPAP or BIPAP may be used in the hospital, or your health care provider may want you to use it at home. You may need to have a sleep study before your health care provider can order a machine for you to use at home. What are the advantages? CPAP or BIPAP can be helpful if you have: Sleep apnea. Chronic obstructive pulmonary disease (COPD). Heart failure. Medical conditions that cause muscle weakness, including muscular dystrophy or amyotrophic lateral sclerosis (ALS). Other problems that cause breathing to be shallow, weak, abnormal, or difficult. CPAP and BIPAP are most commonly used for obstructive sleep apnea (OSA) to keep the airways from collapsing when the muscles relax during sleep. What are the risks? Generally, this is a safe treatment. However, problems may occur, including: Irritated skin or skin sores if the mask does not fit properly. Dry or stuffy nose or nosebleeds. Dry mouth. Feeling gassy or bloated. Sinus or lung infection if the equipment  is not cleaned properly. When should CPAP or BIPAP be used? In most cases, the mask only needs to be worn during sleep. Generally, the mask needs to be worn throughout the night and during any daytime naps. People with certain medical conditions may also need to wear the mask at other times, such as when they are awake. Follow instructions from your health care provider about when to use the machine. What happens during CPAP or BIPAP?  Both CPAP and BIPAP are provided by a small machine with a flexible plastic tube that attaches to a plastic mask that you wear. Air is blown through the mask into your nose or mouth. The amount of pressure that is used to blow the air can be adjusted on the machine. Your health care provider will set the pressure setting and help you find the best mask for you. Tips for using the mask Because the mask needs to be snug, some people feel trapped or closed-in (claustrophobic) when first using the mask. If you feel this way, you may need to get used to the mask. One way to do this is to hold the mask loosely over your nose or mouth and then gradually apply the mask more snugly. You can also gradually increase the amount of time that you use the mask. Masks are available in various types and sizes. If your mask does not fit well, talk with your health care provider about getting a different one. Some common types of masks include: Full face masks, which fit over the mouth and nose. Nasal  masks, which fit over the nose. Nasal pillow or prong masks, which fit into the nostrils. If you are using a mask that fits over your nose and you tend to breathe through your mouth, a chin strap may be applied to help keep your mouth closed. Use a skin barrier to protect your skin as told by your health care provider. Some CPAP and BIPAP machines have alarms that may sound if the mask comes off or develops a leak. If you have trouble with the mask, it is very important that you talk with  your health care provider about finding a way to make the mask easier to tolerate. Do not stop using the mask. There could be a negative impact on your health if you stop using the mask. Tips for using the machine Place your CPAP or BIPAP machine on a secure table or stand near an electrical outlet. Know where the on/off switch is on the machine. Follow instructions from your health care provider about how to set the pressure on your machine and when you should use it. Do not eat or drink while the CPAP or BIPAP machine is on. Food or fluids could get pushed into your lungs by the pressure of the CPAP or BIPAP. For home use, CPAP and BIPAP machines can be rented or purchased through home health care companies. Many different brands of machines are available. Renting a machine before purchasing may help you find out which particular machine works well for you. Your health insurance company may also decide which machine you may get. Keep the CPAP or BIPAP machine and attachments clean. Ask your health care provider for specific instructions. Check the humidifier if you have a dry stuffy nose or nosebleeds. Make sure it is working correctly. Follow these instructions at home: Take over-the-counter and prescription medicines only as told by your health care provider. Ask if you can take sinus medicine if your sinuses are blocked. Do not use any products that contain nicotine or tobacco. These products include cigarettes, chewing tobacco, and vaping devices, such as e-cigarettes. If you need help quitting, ask your health care provider. Keep all follow-up visits. This is important. Contact a health care provider if: You have redness or pressure sores on your head, face, mouth, or nose from the mask or head gear. You have trouble using the CPAP or BIPAP machine. You cannot tolerate wearing the CPAP or BIPAP mask. Someone tells you that you snore even when wearing your CPAP or BIPAP. Get help right away  if: You have trouble breathing. You feel confused. Summary CPAP and BIPAP are methods that use air pressure to keep your airways open and to help you breathe well. If you have trouble with the mask, it is very important that you talk with your health care provider about finding a way to make the mask easier to tolerate. Do not stop using the mask. There could be a negative impact to your health if you stop using the mask. Follow instructions from your health care provider about when to use the machine. This information is not intended to replace advice given to you by your health care provider. Make sure you discuss any questions you have with your health care provider. Document Revised: 04/01/2021 Document Reviewed: 08/01/2020 Elsevier Patient Education  [place].

## 2022-03-24 ENCOUNTER — Other Ambulatory Visit: Payer: Medicare Other

## 2022-03-24 DIAGNOSIS — I10 Essential (primary) hypertension: Secondary | ICD-10-CM

## 2022-03-24 DIAGNOSIS — R7309 Other abnormal glucose: Secondary | ICD-10-CM

## 2022-03-24 DIAGNOSIS — E782 Mixed hyperlipidemia: Secondary | ICD-10-CM

## 2022-03-24 DIAGNOSIS — G4733 Obstructive sleep apnea (adult) (pediatric): Secondary | ICD-10-CM

## 2022-03-24 DIAGNOSIS — Z6841 Body Mass Index (BMI) 40.0 and over, adult: Secondary | ICD-10-CM

## 2022-03-25 LAB — CBC WITH DIFFERENTIAL/PLATELET
Absolute Monocytes: 544 cells/uL (ref 200–950)
Basophils Absolute: 58 cells/uL (ref 0–200)
Basophils Relative: 0.9 %
Eosinophils Absolute: 102 cells/uL (ref 15–500)
Eosinophils Relative: 1.6 %
HCT: 38.9 % (ref 35.0–45.0)
Hemoglobin: 12.5 g/dL (ref 11.7–15.5)
Lymphs Abs: 3021 cells/uL (ref 850–3900)
MCH: 29.3 pg (ref 27.0–33.0)
MCHC: 32.1 g/dL (ref 32.0–36.0)
MCV: 91.3 fL (ref 80.0–100.0)
MPV: 10.5 fL (ref 7.5–12.5)
Monocytes Relative: 8.5 %
Neutro Abs: 2675 cells/uL (ref 1500–7800)
Neutrophils Relative %: 41.8 %
Platelets: 256 10*3/uL (ref 140–400)
RBC: 4.26 10*6/uL (ref 3.80–5.10)
RDW: 12.7 % (ref 11.0–15.0)
Total Lymphocyte: 47.2 %
WBC: 6.4 10*3/uL (ref 3.8–10.8)

## 2022-03-25 LAB — COMPLETE METABOLIC PANEL WITH GFR
AG Ratio: 1.6 (calc) (ref 1.0–2.5)
ALT: 10 U/L (ref 6–29)
AST: 10 U/L (ref 10–35)
Albumin: 3.7 g/dL (ref 3.6–5.1)
Alkaline phosphatase (APISO): 144 U/L (ref 37–153)
BUN: 15 mg/dL (ref 7–25)
CO2: 26 mmol/L (ref 20–32)
Calcium: 8.9 mg/dL (ref 8.6–10.4)
Chloride: 105 mmol/L (ref 98–110)
Creat: 0.88 mg/dL (ref 0.60–1.00)
Globulin: 2.3 g/dL (calc) (ref 1.9–3.7)
Glucose, Bld: 167 mg/dL — ABNORMAL HIGH (ref 65–99)
Potassium: 4.4 mmol/L (ref 3.5–5.3)
Sodium: 141 mmol/L (ref 135–146)
Total Bilirubin: 0.5 mg/dL (ref 0.2–1.2)
Total Protein: 6 g/dL — ABNORMAL LOW (ref 6.1–8.1)
eGFR: 68 mL/min/{1.73_m2} (ref >=60)

## 2022-03-25 LAB — LIPID PANEL
Cholesterol: 164 mg/dL (ref <200)
HDL: 33 mg/dL — ABNORMAL LOW (ref >=50)
LDL Cholesterol (Calc): 101 mg/dL (calc) — ABNORMAL HIGH
Non-HDL Cholesterol (Calc): 131 mg/dL (calc) — ABNORMAL HIGH (ref <130)
Total CHOL/HDL Ratio: 5 (calc) — ABNORMAL HIGH (ref <5.0)
Triglycerides: 182 mg/dL — ABNORMAL HIGH (ref <150)

## 2022-03-25 LAB — HEMOGLOBIN A1C
Hgb A1c MFr Bld: 7.7 % of total Hgb — ABNORMAL HIGH (ref <5.7)
Mean Plasma Glucose: 174 mg/dL
eAG (mmol/L): 9.7 mmol/L

## 2022-03-25 LAB — TSH: TSH: 2.19 mIU/L (ref 0.40–4.50)

## 2022-03-26 ENCOUNTER — Ambulatory Visit (INDEPENDENT_AMBULATORY_CARE_PROVIDER_SITE_OTHER): Payer: Medicare Other

## 2022-03-26 ENCOUNTER — Other Ambulatory Visit: Payer: Self-pay | Admitting: Family Medicine

## 2022-03-26 VITALS — Wt 284.0 lb

## 2022-03-26 DIAGNOSIS — Z0000 Encounter for general adult medical examination without abnormal findings: Secondary | ICD-10-CM

## 2022-03-26 DIAGNOSIS — E782 Mixed hyperlipidemia: Secondary | ICD-10-CM

## 2022-03-26 NOTE — Patient Instructions (Signed)
Natasha Curry , Thank you for taking time to come for your Medicare Wellness Visit. I appreciate your ongoing commitment to your health goals. Please review the following plan we discussed and let me know if I can assist you in the future.   Screening recommendations/referrals: Colonoscopy: aged out Mammogram: aged out Bone Density: 09/06/18 Recommended yearly ophthalmology/optometry visit for glaucoma screening and checkup Recommended yearly dental visit for hygiene and checkup  Vaccinations: Influenza vaccine: 08/05/21 Pneumococcal vaccine: 12/15/18, need 2nd shot Tdap vaccine: n/d Shingles vaccine: Shingrix 12/15/18, 02/14/19   Covid-19:10/22/19, 11/12/19, 06/15/20, 08/16/21  Advanced directives: no  Conditions/risks identified: none  Next appointment: Follow up in one year for your annual wellness visit 04/01/23 @ 10:15 am by phone   Preventive Care 65 Years and Older, Female Preventive care refers to lifestyle choices and visits with your health care provider that can promote health and wellness. What does preventive care include? A yearly physical exam. This is also called an annual well check. Dental exams once or twice a year. Routine eye exams. Ask your health care provider how often you should have your eyes checked. Personal lifestyle choices, including: Daily care of your teeth and gums. Regular physical activity. Eating a healthy diet. Avoiding tobacco and drug use. Limiting alcohol use. Practicing safe sex. Taking low-dose aspirin every day. Taking vitamin and mineral supplements as recommended by your health care provider. What happens during an annual well check? The services and screenings done by your health care provider during your annual well check will depend on your age, overall health, lifestyle risk factors, and family history of disease. Counseling  Your health care provider may ask you questions about your: Alcohol use. Tobacco use. Drug use. Emotional  well-being. Home and relationship well-being. Sexual activity. Eating habits. History of falls. Memory and ability to understand (cognition). Work and work Statistician. Reproductive health. Screening  You may have the following tests or measurements: Height, weight, and BMI. Blood pressure. Lipid and cholesterol levels. These may be checked every 5 years, or more frequently if you are over 30 years old. Skin check. Lung cancer screening. You may have this screening every year starting at age 9 if you have a 30-pack-year history of smoking and currently smoke or have quit within the past 15 years. Fecal occult blood test (FOBT) of the stool. You may have this test every year starting at age 53. Flexible sigmoidoscopy or colonoscopy. You may have a sigmoidoscopy every 5 years or a colonoscopy every 10 years starting at age 88. Hepatitis C blood test. Hepatitis B blood test. Sexually transmitted disease (STD) testing. Diabetes screening. This is done by checking your blood sugar (glucose) after you have not eaten for a while (fasting). You may have this done every 1-3 years. Bone density scan. This is done to screen for osteoporosis. You may have this done starting at age 79. Mammogram. This may be done every 1-2 years. Talk to your health care provider about how often you should have regular mammograms. Talk with your health care provider about your test results, treatment options, and if necessary, the need for more tests. Vaccines  Your health care provider may recommend certain vaccines, such as: Influenza vaccine. This is recommended every year. Tetanus, diphtheria, and acellular pertussis (Tdap, Td) vaccine. You may need a Td booster every 10 years. Zoster vaccine. You may need this after age 87. Pneumococcal 13-valent conjugate (PCV13) vaccine. One dose is recommended after age 21. Pneumococcal polysaccharide (PPSV23) vaccine. One dose is recommended  after age 32. Talk to your  health care provider about which screenings and vaccines you need and how often you need them. This information is not intended to replace advice given to you by your health care provider. Make sure you discuss any questions you have with your health care provider. Document Released: 09/19/2015 Document Revised: 05/12/2016 Document Reviewed: 06/24/2015 Elsevier Interactive Patient Education  2017 [place] Prevention in the Home Falls can cause injuries. They can happen to people of all ages. There are many things you can do to make your home safe and to help prevent falls. What can I do on the outside of my home? Regularly fix the edges of walkways and driveways and fix any cracks. Remove anything that might make you trip as you walk through a door, such as a raised step or threshold. Trim any bushes or trees on the path to your home. Use bright outdoor lighting. Clear any walking paths of anything that might make someone trip, such as rocks or tools. Regularly check to see if handrails are loose or broken. Make sure that both sides of any steps have handrails. Any raised decks and porches should have guardrails on the edges. Have any leaves, snow, or ice cleared regularly. Use sand or salt on walking paths during winter. Clean up any spills in your garage right away. This includes oil or grease spills. What can I do in the bathroom? Use night lights. Install grab bars by the toilet and in the tub and shower. Do not use towel bars as grab bars. Use non-skid mats or decals in the tub or shower. If you need to sit down in the shower, use a plastic, non-slip stool. Keep the floor dry. Clean up any water that spills on the floor as soon as it happens. Remove soap buildup in the tub or shower regularly. Attach bath mats securely with double-sided non-slip rug tape. Do not have throw rugs and other things on the floor that can make you trip. What can I do in the bedroom? Use night  lights. Make sure that you have a light by your bed that is easy to reach. Do not use any sheets or blankets that are too big for your bed. They should not hang down onto the floor. Have a firm chair that has side arms. You can use this for support while you get dressed. Do not have throw rugs and other things on the floor that can make you trip. What can I do in the kitchen? Clean up any spills right away. Avoid walking on wet floors. Keep items that you use a lot in easy-to-reach places. If you need to reach something above you, use a strong step stool that has a grab bar. Keep electrical cords out of the way. Do not use floor polish or wax that makes floors slippery. If you must use wax, use non-skid floor wax. Do not have throw rugs and other things on the floor that can make you trip. What can I do with my stairs? Do not leave any items on the stairs. Make sure that there are handrails on both sides of the stairs and use them. Fix handrails that are broken or loose. Make sure that handrails are as long as the stairways. Check any carpeting to make sure that it is firmly attached to the stairs. Fix any carpet that is loose or worn. Avoid having throw rugs at the top or bottom of the stairs. If you  do have throw rugs, attach them to the floor with carpet tape. Make sure that you have a light switch at the top of the stairs and the bottom of the stairs. If you do not have them, ask someone to add them for you. What else can I do to help prevent falls? Wear shoes that: Do not have high heels. Have rubber bottoms. Are comfortable and fit you well. Are closed at the toe. Do not wear sandals. If you use a stepladder: Make sure that it is fully opened. Do not climb a closed stepladder. Make sure that both sides of the stepladder are locked into place. Ask someone to hold it for you, if possible. Clearly mark and make sure that you can see: Any grab bars or handrails. First and last  steps. Where the edge of each step is. Use tools that help you move around (mobility aids) if they are needed. These include: Canes. Walkers. Scooters. Crutches. Turn on the lights when you go into a dark area. Replace any light bulbs as soon as they burn out. Set up your furniture so you have a clear path. Avoid moving your furniture around. If any of your floors are uneven, fix them. If there are any pets around you, be aware of where they are. Review your medicines with your doctor. Some medicines can make you feel dizzy. This can increase your chance of falling. Ask your doctor what other things that you can do to help prevent falls. This information is not intended to replace advice given to you by your health care provider. Make sure you discuss any questions you have with your health care provider. Document Released: 06/19/2009 Document Revised: 01/29/2016 Document Reviewed: 09/27/2014 Elsevier Interactive Patient Education  2017 [employer].

## 2022-03-26 NOTE — Progress Notes (Signed)
Virtual Visit via Telephone Note  I connected with  Natasha Curry on 03/26/22 at 11:00 AM EDT by telephone and verified that I am speaking with the correct person using two identifiers.  Location: Patient: home Provider: [facility] Persons participating in the virtual visit: [place]   I discussed the limitations, risks, security and privacy concerns of performing an evaluation and management service by telephone and the availability of in person appointments. The patient expressed understanding and agreed to proceed.  Interactive audio and video telecommunications were attempted between this nurse and patient, however failed, due to patient having technical difficulties OR patient did not have access to video capability.  We continued and completed visit with audio only.  Some vital signs may be absent or patient reported.   Dionisio David, LPN  Subjective:   Natasha Curry is a 76 y.o. female who presents for Medicare Annual (Subsequent) preventive examination.  Review of Systems     Cardiac Risk Factors include: advanced age (>31mn, >>90women);hypertension;sedentary lifestyle     Objective:    There were no vitals filed for this visit. There is no height or weight on file to calculate BMI.     03/26/2022   11:00 AM 03/24/2021   11:08 AM 04/07/2020   11:14 AM  Advanced Directives  Does Patient Have a Medical Advance Directive? No Yes No  Type of ACorporate treasurerof [place]eph[place]Living will   Copy of HP[place]akin Chart?  No - copy requested   Would patient like information on creating a medical advance directive? No - Patient declined  No - Patient declined    Current Medications (verified) Outpatient Encounter Medications as of 03/26/2022  Medication Sig   atenolol (TENORMIN) 25 MG tablet Take 1 tablet every day by oral route.   atorvastatin (LIPITOR) 10 MG tablet TAKE 1 TABLET BY MOUTH EVERY DAY   baclofen (LIORESAL) 10 MG  tablet    ciclopirox (LOPROX) 0.77 % cream Apply topically 2 (two) times daily. 2-4 weeks.   cyclobenzaprine (FLEXERIL) 10 MG tablet 1 tab at bedtime as needed for muscle spasm, pain, and rest   desoximetasone (TOPICORT) 0.25 % cream Apply 1 Application topically 2 (two) times daily.   diclofenac Sodium (VOLTAREN) 1 % GEL Apply 2 g topically 4 (four) times daily as needed.   escitalopram (LEXAPRO) 20 MG tablet Take 1 tablet (20 mg total) by mouth daily.   fluticasone (FLONASE) 50 MCG/ACT nasal spray PLACE 2 SPRAYS INTO BOTH NOSTRILS DAILY. USE FOR 4-6 WEEKS THEN STOP AND USE SEASONALLY OR AS NEEDED.   HYDROcodone-acetaminophen (NORCO/VICODIN) 5-325 MG tablet Take 1 tablet by mouth every 8 (eight) hours as needed.   lidocaine (LIDODERM) 5 % PLEASE SEE ATTACHED FOR DETAILED DIRECTIONS   methocarbamol (ROBAXIN) 750 MG tablet Take 2 tablets (1,500 mg total) by mouth every 6 (six) hours as needed for muscle spasms.   metoprolol succinate (TOPROL-XL) 25 MG 24 hr tablet Take 0.5 tablets (12.5 mg total) by mouth daily.   Metoprolol-Hydrochlorothiazide 25-12.5 MG TB24    nystatin cream (MYCOSTATIN) APPLY A THIN LAYER TO AFFECTED AREA TWICE DAILY TO FOLDS   omeprazole (PRILOSEC) 40 MG capsule TAKE 1 CAPSULE BY MOUTH EVERY DAY   tiZANidine (ZANAFLEX) 2 MG tablet    tiZANidine (ZANAFLEX) 4 MG tablet    traMADol (ULTRAM) 50 MG tablet 1 TAB AT BED TIME FOR PAIN   triamcinolone (KENALOG) 0.025 % cream 1(ONE) APPLICATION(S) TOPICAL 2(TWO) TIMES A DAY TO  FOLDS AND LOWER EYELID   triamcinolone cream (KENALOG) 0.5 % Apply 1 application topically 2 (two) times daily. To affected areas, for up to 2 weeks.   [DISCONTINUED] atorvastatin (LIPITOR) 10 MG tablet Take 1 tablet by mouth daily.   amoxicillin (AMOXIL) 500 MG capsule Take 2 capsules ('1000mg'$ ) dose twice a day for 1 day prior to dental work. (Patient not taking: Reported on 03/26/2022)   amoxicillin-clavulanate (AUGMENTIN) 875-125 MG tablet TAKE 1 TABLET BY  MOUTH 2 (TWO) TIMES DAILY. FOR 10 DAYS (Patient not taking: Reported on 03/26/2022)   cefdinir (OMNICEF) 300 MG capsule Take 1 capsule by mouth every 12 (twelve) hours. (Patient not taking: Reported on 03/26/2022)   celecoxib (CELEBREX) 100 MG capsule Take 1 capsule every day by oral route. (Patient not taking: Reported on 03/26/2022)   fluconazole (DIFLUCAN) 150 MG tablet TAKE ONE TABLET WEEKLY FOR 6 WEEKS (Patient not taking: Reported on 03/26/2022)   oxyCODONE-acetaminophen (PERCOCET/ROXICET) 5-325 MG tablet TAKE 1-2 TABLETS BY MOUTH EVERY 6 (SIX) HOURS AS NEEDED FOR UP TO 5 DAYS FOR SEVERE PAIN. (Patient not taking: Reported on 03/26/2022)   [DISCONTINUED] omeprazole (PRILOSEC) 40 MG capsule Take 1 tablet by mouth daily.   No facility-administered encounter medications on file as of 03/26/2022.    Allergies (verified) Patient has no known allergies.   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   Colon polyp    Depression    Eczema    GERD (gastroesophageal reflux disease)    Kidney stones    Psoriasis    Sleep apnea    Tremor    Past Surgical History:  Procedure Laterality Date   CHOLECYSTECTOMY  2000   GASTRIC BYPASS  2013   TOTAL KNEE ARTHROPLASTY  2014   Family History  Problem Relation Age of Onset   Cancer Mother        adrenal   Heart attack Father    Heart disease Father    Stroke Father    Breast cancer Neg Hx    Social History   Socioeconomic History   Marital status: Married    Spouse name: Not on file   Number of children: Not on file   Years of education: Not on file   Highest education level: Not on file  Occupational History   Not on file  Tobacco Use   Smoking status: Never   Smokeless tobacco: Never  Vaping Use   Vaping Use: Never used  Substance and Sexual Activity   Alcohol use: Yes    Alcohol/week: 3.0 standard drinks of alcohol    Types: 3 Standard drinks or equivalent per week    Drug use: Never   Sexual activity: Not on file  Other Topics Concern   Not on file  Social History Narrative   Not on file   Social Determinants of Health   Financial Resource Strain: Low Risk  (03/26/2022)   Overall Financial Resource Strain (CARDIA)    Difficulty of Paying Living Expenses: Not hard at all  Food Insecurity: No Food Insecurity (03/26/2022)   Hunger Vital Sign    Worried About Running Out of Food in the Last Year: Never true    Ran Out of Food in the Last Year: Never true  Transportation Needs: No Transportation Needs (03/26/2022)   PRAPARE - Hydrologist (Medical): No    Lack of Transportation (Non-Medical): No  Physical Activity: Inactive (03/24/2021)   Exercise Vital Sign    Days of Exercise per Week: 0 days    Minutes of Exercise per Session: 0 min  Stress: No Stress Concern Present (03/26/2022)   [place]    Feeling of Stress : Not at all  Social Connections: Moderately Isolated (03/26/2022)   Social Connection and Isolation Panel [NHANES]    Frequency of Communication with Friends and Family: More than three times a week    Frequency of Social Gatherings with Friends and Family: Never    Attends Religious Services: Never    Printmaker: No    Attends Music therapist: Never    Marital Status: Married    Tobacco Counseling Counseling given: Not Answered   Clinical Intake:  Pre-visit preparation completed: Yes  Pain : No/denies pain     Nutritional Risks: None Diabetes: No  How often do you need to have someone help you when you read instructions, pamphlets, or other written materials from your doctor or pharmacy?: 1 - Never  Diabetic?no  Interpreter Needed?: No  Information entered by :: Kirke Shaggy, LPN   Activities of Daily Living    03/26/2022   11:01 AM  In your present state of health, do you have  any difficulty performing the following activities:  Hearing? 1  Vision? 0  Difficulty concentrating or making decisions? 0  Walking or climbing stairs? 0  Dressing or bathing? 0  Doing errands, shopping? 0  Preparing Food and eating ? N  Using the Toilet? N  In the past six months, have you accidently leaked urine? N  Do you have problems with loss of bowel control? N  Managing your Medications? N  Managing your Finances? N  Housekeeping or managing your Housekeeping? N    Patient Care Team: Olin Hauser, DO as PCP - General (Family Medicine)  Indicate any recent Medical Services you may have received from other than [facility] providers in the past year (date may be approximate).     Assessment:   This is a routine wellness examination for Vana.  Hearing/Vision screen Hearing Screening - Comments:: No aids Vision Screening - Comments:: Wears glasses-   Dietary issues and exercise activities discussed: Current Exercise Habits: The patient does not participate in regular exercise at present   Goals Addressed             This Visit's Progress    DIET - EAT MORE FRUITS AND VEGETABLES         Depression Screen    03/26/2022   10:58 AM 12/23/2021    8:27 AM 03/24/2021   11:09 AM 02/10/2021   10:49 AM 01/07/2021    1:27 PM 11/27/2019    9:31 AM 04/24/2019   10:12 AM  PHQ 2/9 Scores  PHQ - 2 Score 0 0 0 0 0 0 1  PHQ- 9 Score 0 '3  1 1  10    '$ Fall Risk    03/26/2022   11:00 AM 12/23/2021    8:27 AM 03/24/2021   11:08 AM 02/10/2021   10:49 AM 10/09/2020    9:59 AM  Fall Risk   Falls in the past year? 1 0 1 0 0  Comment   slipped in the yard    Number falls in past yr: 0 0 0 0   Injury with Fall? 1 0 1 0   Risk for fall due to :  History of fall(s) History of fall(s) Medication side effect    Follow up Falls evaluation completed;Falls prevention discussed Falls evaluation completed Falls evaluation completed;Education provided;Falls prevention discussed Falls  evaluation completed     FALL RISK PREVENTION PERTAINING TO THE HOME:  Any stairs in or around the home? No  If so, are there any without handrails? No  Home free of loose throw rugs in walkways, pet beds, electrical cords, etc? Yes  Adequate lighting in your home to reduce risk of falls? Yes   ASSISTIVE DEVICES UTILIZED TO PREVENT FALLS:  Life alert? No  Use of a cane, walker or w/c? Yes  Grab bars in the bathroom? Yes  Shower chair or bench in shower? No  Elevated toilet seat or a handicapped toilet? No   Cognitive Function:        03/26/2022   11:01 AM 03/24/2021   11:12 AM  6CIT Screen  What Year? 0 points 0 points  What month? 0 points 0 points  What time? 0 points 3 points  Count back from 20 0 points 0 points  Months in reverse 0 points 2 points  Repeat phrase 0 points 2 points  Total Score 0 points 7 points    Immunizations Immunization History  Administered Date(s) Administered   Influenza, High Dose Seasonal PF 05/12/2019, 05/29/2020, 08/05/2021   PFIZER(Purple Top)SARS-COV-2 Vaccination 10/22/2019, 11/12/2019, 06/15/2020   Pfizer Covid-19 Vaccine Bivalent Booster 24yr & up 08/16/2021   Pneumococcal Conjugate-13 12/15/2018   Zoster Recombinat (Shingrix) 12/15/2018, 02/14/2019    TDAP status: Due, Education has been provided regarding the importance of this vaccine. Advised may receive this vaccine at local pharmacy or Health Dept. Aware to provide a copy of the vaccination record if obtained from local pharmacy or Health Dept. Verbalized acceptance and understanding.  Flu Vaccine status: Up to date  Pneumococcal vaccine status: Due, Education has been provided regarding the importance of this vaccine. Advised may receive this vaccine at local pharmacy or Health Dept. Aware to provide a copy of the vaccination record if obtained from local pharmacy or Health Dept. Verbalized acceptance and understanding.  Covid-19 vaccine status: Completed  vaccines  Qualifies for Shingles Vaccine? Yes   Zostavax completed No   Shingrix Completed?: Yes  Screening Tests Health Maintenance  Topic Date Due   Pneumonia Vaccine 77 Years old (2 - PPSV23 or PCV20) 03/06/2023 (Originally 12/15/2019)   TETANUS/TDAP  02/11/2024 (Originally [birth year]14/1965)   Hepatitis C Screening  02/11/2024 (Originally 02/14/1964)   INFLUENZA VACCINE  04/06/2022   DEXA SCAN  Completed   COVID-19 Vaccine  Completed   Zoster Vaccines- Shingrix  Completed   HPV VACCINES  Aged Out   COLONOSCOPY (Pts 45-446yrInsurance coverage will need to be confirmed)  Discontinued    Health Maintenance  There are no preventive care reminders to display for this patient.  Colorectal cancer screening: No longer required.   Mammogram status: No longer required due to age.  Bone Density status: Completed 09/06/18. Results reflect: Bone density results: NORMAL. Repeat every 5 years.  Lung Cancer Screening: (Low Dose CT Chest recommended if Age 77-80ears, 30 pack-year currently smoking OR have quit w/in 15years.) does not qualify.    Additional Screening:  Hepatitis C Screening: does qualify; Completed no  Vision Screening: Recommended annual ophthalmology exams for early detection of glaucoma and other disorders of the eye. Is the patient up to date with their annual eye exam?  No  Who is the provider or what is the name  of the office in which the patient attends annual eye exams? No one If pt is not established with a provider, would they like to be referred to a provider to establish care? No .   Dental Screening: Recommended annual dental exams for proper oral hygiene  Community Resource Referral / Chronic Care Management: CRR required this visit?  No   CCM required this visit?  No      Plan:     I have personally reviewed and noted the following in the patient's chart:   Medical and social history Use of alcohol, tobacco or illicit drugs  Current medications and  supplements including opioid prescriptions.  Functional ability and status Nutritional status Physical activity Advanced directives List of other physicians Hospitalizations, surgeries, and ER visits in previous 12 months Vitals Screenings to include cognitive, depression, and falls Referrals and appointments  In addition, I have reviewed and discussed with patient certain preventive protocols, quality metrics, and best practice recommendations. A written personalized care plan for preventive services as well as general preventive health recommendations were provided to patient.     Dionisio David, LPN   [id number]   Nurse Notes: none

## 2022-03-26 NOTE — Telephone Encounter (Signed)
Requested Prescriptions  Pending Prescriptions Disp Refills  . atorvastatin (LIPITOR) 10 MG tablet [Pharmacy Med Name: ATORVASTATIN 10 MG TABLET] 90 tablet 0    Sig: TAKE 1 TABLET BY MOUTH EVERY DAY     Cardiovascular:  Antilipid - Statins Failed - 03/26/2022  2:09 AM      Failed - Lipid Panel in normal range within the last 12 months    Cholesterol  Date Value Ref Range Status  03/24/2022 164 <200 mg/dL Final   LDL Cholesterol (Calc)  Date Value Ref Range Status  03/24/2022 101 (H) mg/dL (calc) Final    Comment:    Reference range: <100 . Desirable range <100 mg/dL for primary prevention;   <70 mg/dL for patients with CHD or diabetic patients  with > or = 2 CHD risk factors. Marland Kitchen LDL-C is now calculated using the Martin-Hopkins  calculation, which is a validated novel method providing  better accuracy than the Friedewald equation in the  estimation of LDL-C.  Cresenciano Genre et al. Annamaria Helling. 0102;725(36): 2061-2068  (http://education.[facility].com/faq/FAQ164)    HDL  Date Value Ref Range Status  03/24/2022 33 (L) > OR = 50 mg/dL Final   Triglycerides  Date Value Ref Range Status  03/24/2022 182 (H) <150 mg/dL Final         Passed - Patient is not pregnant      Passed - Valid encounter within last 12 months    Recent Outpatient Visits          3 months ago Morbid obesity with BMI of 50.0-59.9, adult [facility])   [facility] Olin Hauser, DO   1 year ago Tinea corporis   [place], DO   1 year ago Degenerative lumbar spinal stenosis   [place], DO   1 year ago Hearing loss of left ear, unspecified hearing loss type   [place], DO   1 year ago Chronic bilateral low back pain with left-sided sciatica   [facility] Olin Hauser, DO      Future Appointments            In 5 days [place],  Devonne Doughty, [facility], [facility]

## 2022-03-30 ENCOUNTER — Ambulatory Visit: Payer: Medicare Other

## 2022-03-31 ENCOUNTER — Other Ambulatory Visit: Payer: Self-pay | Admitting: Family Medicine

## 2022-03-31 ENCOUNTER — Encounter: Payer: Self-pay | Admitting: Family Medicine

## 2022-03-31 ENCOUNTER — Ambulatory Visit (INDEPENDENT_AMBULATORY_CARE_PROVIDER_SITE_OTHER): Payer: Medicare Other | Admitting: Family Medicine

## 2022-03-31 VITALS — BP 142/88 | HR 76 | Ht 63.0 in | Wt 286.2 lb

## 2022-03-31 DIAGNOSIS — G8929 Other chronic pain: Secondary | ICD-10-CM

## 2022-03-31 DIAGNOSIS — M5442 Lumbago with sciatica, left side: Secondary | ICD-10-CM

## 2022-03-31 DIAGNOSIS — E1169 Type 2 diabetes mellitus with other specified complication: Secondary | ICD-10-CM

## 2022-03-31 DIAGNOSIS — M48061 Spinal stenosis, lumbar region without neurogenic claudication: Secondary | ICD-10-CM

## 2022-03-31 DIAGNOSIS — I10 Essential (primary) hypertension: Secondary | ICD-10-CM

## 2022-03-31 DIAGNOSIS — H9192 Unspecified hearing loss, left ear: Secondary | ICD-10-CM | POA: Diagnosis not present

## 2022-03-31 DIAGNOSIS — Z6841 Body Mass Index (BMI) 40.0 and over, adult: Secondary | ICD-10-CM

## 2022-03-31 DIAGNOSIS — H60543 Acute eczematoid otitis externa, bilateral: Secondary | ICD-10-CM | POA: Diagnosis not present

## 2022-03-31 MED ORDER — METOPROLOL SUCCINATE ER 25 MG PO TB24: 25.0000 mg | ORAL_TABLET | Freq: Every day | ORAL | 3 refills | Status: DC

## 2022-03-31 MED ORDER — METFORMIN HCL 500 MG PO TABS: 500.0000 mg | ORAL_TABLET | Freq: Two times a day (BID) | ORAL | 3 refills | Status: DC

## 2022-03-31 MED ORDER — METHOCARBAMOL 750 MG PO TABS: 750.0000 mg | ORAL_TABLET | Freq: Three times a day (TID) | ORAL | 3 refills | Status: DC | PRN

## 2022-03-31 MED ORDER — CYCLOBENZAPRINE HCL 10 MG PO TABS: 10.0000 mg | ORAL_TABLET | Freq: Every evening | ORAL | 2 refills | Status: DC | PRN

## 2022-03-31 NOTE — Progress Notes (Signed)
Subjective:    Patient ID: Natasha Curry, female    DOB: 1945/09/07, 76 y.o.   MRN: [id number]  Natasha Curry is a 76 y.o. female presenting on 03/31/2022 for Obesity   HPI  Morbid Obesity BMI >50 Gained 2 lbs in past 3 months Difficulty with reducing portions, admits she eats every hour, small amount but too often Asking about medication options.  Chronic Back Pain Improved on muscle relaxants will need re order, takes Robaxin during day and Flexeril at night due to sedation. Tolerating well.  New Type 2 Diabetes diagnosis A1c up to 7.7 on last lab, prior >6.5 previously Husband is diabetic She is not checking sugar yet, but interested in medication.  Hearing Loss Eczema of ear canals Request ENT evaluation for hearing loss, recent worsening chronic problem.      03/31/2022    9:53 AM 03/26/2022   10:58 AM 12/23/2021    8:27 AM  Depression screen PHQ 2/9  Decreased Interest 0 0 0  Down, Depressed, Hopeless 0 0 0  PHQ - 2 Score 0 0 0  Altered sleeping 1 0 1  Tired, decreased energy 2 0 1  Change in appetite 3 0 1  Feeling bad or failure about yourself  1 0 0  Trouble concentrating 0 0 0  Moving slowly or fidgety/restless 0 0 0  Suicidal thoughts 0 0 0  PHQ-9 Score 7 0 3  Difficult doing work/chores Not difficult at all Not difficult at all Not difficult at all    Social History   Tobacco Use   Smoking status: Never   Smokeless tobacco: Never  Vaping Use   Vaping Use: Never used  Substance Use Topics   Alcohol use: Yes    Alcohol/week: 3.0 standard drinks of alcohol    Types: 3 Standard drinks or equivalent per week   Drug use: Never    Review of Systems Per HPI unless specifically indicated above     Objective:    BP (!) 142/88 (BP Location: Left Arm, Cuff Size: Normal)   Pulse 76   Ht $R'5\' 3"'oK$  (1.6 m)   Wt 286 lb 3.2 oz (129.8 kg)   SpO2 97%   BMI 50.70 kg/m   Wt Readings from Last 3 Encounters:  03/31/22 286 lb 3.2 oz (129.8 kg)  03/26/22 284 lb  (128.8 kg)  03/05/22 284 lb 6.4 oz (129 kg)    Physical Exam Vitals and nursing note reviewed.  Constitutional:      General: She is not in acute distress.    Appearance: She is well-developed. She is obese. She is not diaphoretic.     Comments: Well-appearing, comfortable, cooperative  HENT:     Head: Normocephalic and atraumatic.  Eyes:     General:        Right eye: No discharge.        Left eye: No discharge.     Conjunctiva/sclera: Conjunctivae normal.  Neck:     Thyroid: No thyromegaly.  Cardiovascular:     Rate and Rhythm: Normal rate and regular rhythm.     Heart sounds: Normal heart sounds. No murmur heard. Pulmonary:     Effort: Pulmonary effort is normal. No respiratory distress.     Breath sounds: Normal breath sounds. No wheezing or rales.  Musculoskeletal:        General: Normal range of motion.     Cervical back: Normal range of motion and neck supple.     Right lower leg: No edema.  Left lower leg: No edema.  Lymphadenopathy:     Cervical: No cervical adenopathy.  Skin:    General: Skin is warm and dry.     Findings: No erythema or rash.  Neurological:     Mental Status: She is alert and oriented to person, place, and time.  Psychiatric:        Behavior: Behavior normal.     Comments: Well groomed, good eye contact, normal speech and thoughts      Results for orders placed or performed in visit on 03/24/22  TSH  Result Value Ref Range   TSH 2.19 0.40 - 4.50 mIU/L  Hemoglobin A1c  Result Value Ref Range   Hgb A1c MFr Bld 7.7 (H) <5.7 % of total Hgb   Mean Plasma Glucose 174 mg/dL   eAG (mmol/L) 9.7 mmol/L  Lipid panel  Result Value Ref Range   Cholesterol 164 <200 mg/dL   HDL 33 (L) > OR = 50 mg/dL   Triglycerides 182 (H) <150 mg/dL   LDL Cholesterol (Calc) 101 (H) mg/dL (calc)   Total CHOL/HDL Ratio 5.0 (H) <5.0 (calc)   Non-HDL Cholesterol (Calc) 131 (H) <130 mg/dL (calc)  CBC with Differential/Platelet  Result Value Ref Range   WBC  6.4 3.8 - 10.8 Thousand/uL   RBC 4.26 3.80 - 5.10 Million/uL   Hemoglobin 12.5 11.7 - 15.5 g/dL   HCT 38.9 35.0 - 45.0 %   MCV 91.3 80.0 - 100.0 fL   MCH 29.3 27.0 - 33.0 pg   MCHC 32.1 32.0 - 36.0 g/dL   RDW 12.7 11.0 - 15.0 %   Platelets 256 140 - 400 Thousand/uL   MPV 10.5 7.5 - 12.5 fL   Neutro Abs 2,675 1,500 - 7,800 cells/uL   Lymphs Abs 3,021 850 - 3,900 cells/uL   Absolute Monocytes 544 200 - 950 cells/uL   Eosinophils Absolute 102 15 - 500 cells/uL   Basophils Absolute 58 0 - 200 cells/uL   Neutrophils Relative % 41.8 %   Total Lymphocyte 47.2 %   Monocytes Relative 8.5 %   Eosinophils Relative 1.6 %   Basophils Relative 0.9 %  COMPLETE METABOLIC PANEL WITH GFR  Result Value Ref Range   Glucose, Bld 167 (H) 65 - 99 mg/dL   BUN 15 7 - 25 mg/dL   Creat 0.88 0.60 - 1.00 mg/dL   eGFR 68 > OR = 60 mL/min/1.23m2   BUN/Creatinine Ratio NOT APPLICABLE 6 - 22 (calc)   Sodium 141 135 - 146 mmol/L   Potassium 4.4 3.5 - 5.3 mmol/L   Chloride 105 98 - 110 mmol/L   CO2 26 20 - 32 mmol/L   Calcium 8.9 8.6 - 10.4 mg/dL   Total Protein 6.0 (L) 6.1 - 8.1 g/dL   Albumin 3.7 3.6 - 5.1 g/dL   Globulin 2.3 1.9 - 3.7 g/dL (calc)   AG Ratio 1.6 1.0 - 2.5 (calc)   Total Bilirubin 0.5 0.2 - 1.2 mg/dL   Alkaline phosphatase (APISO) 144 37 - 153 U/L   AST 10 10 - 35 U/L   ALT 10 6 - 29 U/L      Assessment & Plan:   Problem List Items Addressed This Visit     Degenerative lumbar spinal stenosis   Relevant Medications   methocarbamol (ROBAXIN) 750 MG tablet   cyclobenzaprine (FLEXERIL) 10 MG tablet   Essential hypertension   Relevant Medications   metoprolol succinate (TOPROL-XL) 25 MG 24 hr tablet   Morbid obesity with  BMI of 50.0-59.9, adult (HCC)   Relevant Medications   metFORMIN (GLUCOPHAGE) 500 MG tablet   Type 2 diabetes mellitus with other specified complication (HCC)   Relevant Medications   metFORMIN (GLUCOPHAGE) 500 MG tablet   Other Visit Diagnoses     Hearing  loss of left ear, unspecified hearing loss type    -  Primary   Relevant Orders   Ambulatory referral to ENT   Eczema of external ear, bilateral       Relevant Orders   Ambulatory referral to ENT   Chronic bilateral low back pain with left-sided sciatica       Relevant Medications   methocarbamol (ROBAXIN) 750 MG tablet   cyclobenzaprine (FLEXERIL) 10 MG tablet       New diagnosis T2DM A1c 7.7, prior >6.2 Complications - morbid obesity, hyperlipidemia  Plan:  1. START Metformin therapy 500 BID, future dose titrate up to 1000 BID - Discuss GLP1 therapy in future for wt management 2. Encourage improved lifestyle - low carb, low sugar diet, reduce portion size, continue improving regular exercise 3. May Check CBG , bring log to next visit for review  HTN Will dose increase Metoprolol XL from half to whole tab daily.  Chronic back pain Managed on medications Re order Methocarbamol during day and Flexeril QHS  Hearing Loss Eczema Referral to [facility]  Orders Placed This Encounter  Procedures   Ambulatory referral to ENT    Referral Priority:   Routine    Referral Type:   Consultation    Referral Reason:   Specialty Services Required    Requested Specialty:   Otolaryngology    Number of Visits Requested:   1     Meds ordered this encounter  Medications   methocarbamol (ROBAXIN) 750 MG tablet    Sig: Take 1 tablet (750 mg total) by mouth every 8 (eight) hours as needed for muscle spasms.    Dispense:  270 tablet    Refill:  3   cyclobenzaprine (FLEXERIL) 10 MG tablet    Sig: Take 1 tablet (10 mg total) by mouth at bedtime as needed for muscle spasms.    Dispense:  30 tablet    Refill:  2   metoprolol succinate (TOPROL-XL) 25 MG 24 hr tablet    Sig: Take 1 tablet (25 mg total) by mouth daily.    Dispense:  90 tablet    Refill:  3    Dose increase   metFORMIN (GLUCOPHAGE) 500 MG tablet    Sig: Take 1 tablet (500 mg total) by mouth 2 (two) times daily with a  meal.    Dispense:  180 tablet    Refill:  3      Follow up plan: Return in about 3 months (around 07/01/2022) for 3 month DM A1c, Weight, HTN.    Nobie Putnam, [facility] 03/31/2022, 10:10 AM

## 2022-03-31 NOTE — Patient Instructions (Addendum)
Thank you for coming to the office today.  Referral to [facility] [address]  [place], [place] [address] Ph: [phone]  Dose increase Metoprolol XL 25 from half pill to 1 whole pill daily.  Refilled Muscle relaxants.  Recent Labs    03/24/22 0755  HGBA1C 7.7*   This is in diabetic range. We need to treat the sugar.  Start with Metformin '500mg'$  twice a day with meal. For the first week, you can start with just once a day, then go up to twice day. May have a loose stool, but this should improve over time.  In the future we can consider the injection therapy for weight loss and sugar.   Please schedule a Follow-up Appointment to: Return in about 3 months (around 07/01/2022) for 3 month DM A1c, Weight, HTN.  If you have any other questions or concerns, please feel free to call the office or send a message through [facility]. You may also schedule an earlier appointment if necessary.  Additionally, you may be receiving a survey about your experience at our office within a few days to 1 week by e-mail or mail. We value your feedback.  Nobie Putnam, DO [place]

## 2022-04-01 NOTE — Telephone Encounter (Signed)
Requested medication (s) are due for refill today:  Prescribed yesterday  Requested medication (s) are on the active medication list:   Yes  Future visit scheduled:   No  Seen yesterday by Dr. Parks Ranger   Last ordered: 7/26    Returned because insurance will not pay for this.   Non delegated refill   Requested Prescriptions  Pending Prescriptions Disp Refills   tiZANidine (ZANAFLEX) 4 MG tablet [Pharmacy Med Name: TIZANIDINE HCL 4 MG TABLET]  0    Sig: Take 1 tablet (750 mg total) by mouth every 8 (eight) hours as needed for muscle spasms.     Not Delegated - Cardiovascular:  Alpha-2 Agonists - tizanidine Failed - 03/31/2022 10:25 AM      Failed - This refill cannot be delegated      Passed - Valid encounter within last 6 months    Recent Outpatient Visits           Yesterday Hearing loss of left ear, unspecified hearing loss type   [place], DO   3 months ago Morbid obesity with BMI of 50.0-59.9, adult [facility])   [facility] Olin Hauser, DO   1 year ago Tinea corporis   [place], DO   1 year ago Degenerative lumbar spinal stenosis   [place], DO   1 year ago Hearing loss of left ear, unspecified hearing loss type   [place], Devonne Doughty, DO

## 2022-05-11 ENCOUNTER — Other Ambulatory Visit: Payer: Self-pay | Admitting: Family Medicine

## 2022-05-11 DIAGNOSIS — E782 Mixed hyperlipidemia: Secondary | ICD-10-CM

## 2022-05-13 NOTE — Telephone Encounter (Signed)
Requested Prescriptions  Pending Prescriptions Disp Refills  . atorvastatin (LIPITOR) 10 MG tablet [Pharmacy Med Name: ATORVASTATIN 10 MG TABLET] 90 tablet 0    Sig: TAKE 1 TABLET BY MOUTH EVERY DAY     Cardiovascular:  Antilipid - Statins Failed - 05/11/2022  6:14 PM      Failed - Lipid Panel in normal range within the last 12 months    Cholesterol  Date Value Ref Range Status  03/24/2022 164 <200 mg/dL Final   LDL Cholesterol (Calc)  Date Value Ref Range Status  03/24/2022 101 (H) mg/dL (calc) Final    Comment:    Reference range: <100 . Desirable range <100 mg/dL for primary prevention;   <70 mg/dL for patients with CHD or diabetic patients  with > or = 2 CHD risk factors. Marland Kitchen LDL-C is now calculated using the Martin-Hopkins  calculation, which is a validated novel method providing  better accuracy than the Friedewald equation in the  estimation of LDL-C.  Cresenciano Genre et al. Annamaria Helling. 4268;341(96): 2061-2068  (http://education.[facility].com/faq/FAQ164)    HDL  Date Value Ref Range Status  03/24/2022 33 (L) > OR = 50 mg/dL Final   Triglycerides  Date Value Ref Range Status  03/24/2022 182 (H) <150 mg/dL Final         Passed - Patient is not pregnant      Passed - Valid encounter within last 12 months    Recent Outpatient Visits          1 month ago Hearing loss of left ear, unspecified hearing loss type   [facility], Devonne Doughty, DO   4 months ago Morbid obesity with BMI of 50.0-59.9, adult [facility])   [facility] Olin Hauser, DO   1 year ago Tinea corporis   [place], DO   1 year ago Degenerative lumbar spinal stenosis   [place], DO   1 year ago Hearing loss of left ear, unspecified hearing loss type   [place], Devonne Doughty, DO

## 2022-06-23 ENCOUNTER — Other Ambulatory Visit: Payer: Self-pay | Admitting: Family Medicine

## 2022-06-23 DIAGNOSIS — K219 Gastro-esophageal reflux disease without esophagitis: Secondary | ICD-10-CM

## 2022-06-23 NOTE — Telephone Encounter (Signed)
Requested medication (s) are due for refill today: yes  Requested medication (s) are on the active medication list: yes  Last refill:  12/21/21 #90 1 RF  Future visit scheduled: no  Notes to clinic:  cannot find encounter this year that address med and dx   Requested Prescriptions  Pending Prescriptions Disp Refills   omeprazole (PRILOSEC) 40 MG capsule [Pharmacy Med Name: OMEPRAZOLE DR 40 MG CAPSULE] 90 capsule 1    Sig: TAKE 1 [place]     Gastroenterology: Proton Pump Inhibitors Passed - 06/23/2022  1:41 AM      Passed - Valid encounter within last 12 months    Recent Outpatient Visits           2 months ago Hearing loss of left ear, unspecified hearing loss type   [place], DO   6 months ago Morbid obesity with BMI of 50.0-59.9, adult [facility])   [facility] Olin Hauser, DO   1 year ago Tinea corporis   [place], DO   1 year ago Degenerative lumbar spinal stenosis   [place], DO   1 year ago Hearing loss of left ear, unspecified hearing loss type   [place], Devonne Doughty, DO

## 2022-08-10 ENCOUNTER — Other Ambulatory Visit: Payer: Self-pay | Admitting: Family Medicine

## 2022-08-10 DIAGNOSIS — E782 Mixed hyperlipidemia: Secondary | ICD-10-CM

## 2022-08-10 NOTE — Telephone Encounter (Signed)
Requested Prescriptions  Pending Prescriptions Disp Refills   atorvastatin (LIPITOR) 10 MG tablet [Pharmacy Med Name: ATORVASTATIN 10 MG TABLET] 90 tablet 1    Sig: TAKE 1 TABLET BY MOUTH EVERY DAY     Cardiovascular:  Antilipid - Statins Failed - 08/10/2022  1:16 AM      Failed - Lipid Panel in normal range within the last 12 months    Cholesterol  Date Value Ref Range Status  03/24/2022 164 <200 mg/dL Final   LDL Cholesterol (Calc)  Date Value Ref Range Status  03/24/2022 101 (H) mg/dL (calc) Final    Comment:    Reference range: <100 . Desirable range <100 mg/dL for primary prevention;   <70 mg/dL for patients with CHD or diabetic patients  with > or = 2 CHD risk factors. Marland Kitchen LDL-C is now calculated using the Martin-Hopkins  calculation, which is a validated novel method providing  better accuracy than the Friedewald equation in the  estimation of LDL-C.  Cresenciano Genre et al. Annamaria Helling. 0347;425(95): 2061-2068  (http://education.[facility].com/faq/FAQ164)    HDL  Date Value Ref Range Status  03/24/2022 33 (L) > OR = 50 mg/dL Final   Triglycerides  Date Value Ref Range Status  03/24/2022 182 (H) <150 mg/dL Final         Passed - Patient is not pregnant      Passed - Valid encounter within last 12 months    Recent Outpatient Visits           4 months ago Hearing loss of left ear, unspecified hearing loss type   [place], DO   7 months ago Morbid obesity with BMI of 50.0-59.9, adult [facility])   [facility] Olin Hauser, DO   1 year ago Tinea corporis   [place], DO   1 year ago Degenerative lumbar spinal stenosis   [place], DO   1 year ago Hearing loss of left ear, unspecified hearing loss type   [place], Devonne Doughty, DO

## 2022-09-08 DIAGNOSIS — G4733 Obstructive sleep apnea (adult) (pediatric): Secondary | ICD-10-CM | POA: Diagnosis not present

## 2022-11-03 DIAGNOSIS — H903 Sensorineural hearing loss, bilateral: Secondary | ICD-10-CM | POA: Diagnosis not present

## 2022-11-18 DIAGNOSIS — R69 Illness, unspecified: Secondary | ICD-10-CM | POA: Diagnosis not present

## 2022-11-18 DIAGNOSIS — Z7984 Long term (current) use of oral hypoglycemic drugs: Secondary | ICD-10-CM | POA: Diagnosis not present

## 2022-11-18 DIAGNOSIS — I10 Essential (primary) hypertension: Secondary | ICD-10-CM | POA: Diagnosis not present

## 2022-11-18 DIAGNOSIS — E119 Type 2 diabetes mellitus without complications: Secondary | ICD-10-CM | POA: Diagnosis not present

## 2022-11-18 DIAGNOSIS — G4733 Obstructive sleep apnea (adult) (pediatric): Secondary | ICD-10-CM | POA: Diagnosis not present

## 2022-11-18 DIAGNOSIS — Z85828 Personal history of other malignant neoplasm of skin: Secondary | ICD-10-CM | POA: Diagnosis not present

## 2022-11-18 DIAGNOSIS — Z6841 Body Mass Index (BMI) 40.0 and over, adult: Secondary | ICD-10-CM | POA: Diagnosis not present

## 2022-11-18 DIAGNOSIS — K219 Gastro-esophageal reflux disease without esophagitis: Secondary | ICD-10-CM | POA: Diagnosis not present

## 2022-11-18 DIAGNOSIS — E785 Hyperlipidemia, unspecified: Secondary | ICD-10-CM | POA: Diagnosis not present

## 2022-12-01 DIAGNOSIS — H903 Sensorineural hearing loss, bilateral: Secondary | ICD-10-CM | POA: Diagnosis not present

## 2022-12-02 DIAGNOSIS — H903 Sensorineural hearing loss, bilateral: Secondary | ICD-10-CM | POA: Diagnosis not present

## 2022-12-15 DIAGNOSIS — H903 Sensorineural hearing loss, bilateral: Secondary | ICD-10-CM | POA: Diagnosis not present

## 2022-12-21 ENCOUNTER — Other Ambulatory Visit: Payer: Self-pay | Admitting: Family Medicine

## 2022-12-21 DIAGNOSIS — K219 Gastro-esophageal reflux disease without esophagitis: Secondary | ICD-10-CM

## 2022-12-22 ENCOUNTER — Telehealth: Payer: Self-pay | Admitting: Primary Care

## 2022-12-22 ENCOUNTER — Telehealth: Payer: Self-pay

## 2022-12-22 NOTE — Telephone Encounter (Signed)
Lm for [facility] with [employer].   Will await call from patient.

## 2022-12-22 NOTE — Telephone Encounter (Signed)
Spoke to [name] with [employer] and confirmed below message.

## 2022-12-22 NOTE — Telephone Encounter (Signed)
[employer] calling and state they got a fax from [place] about setting the PT up with "[facility]" for [facility]. The Rep.\., Dee, states that she has been with [facility] and has not used her Cpap since Oct of 2023 based on their readings.  Please call [facility] @ [phone] if you have questions., TY.

## 2022-12-22 NOTE — Telephone Encounter (Signed)
Requested Prescriptions  Pending Prescriptions Disp Refills   omeprazole (PRILOSEC) 40 MG capsule [Pharmacy Med Name: OMEPRAZOLE DR 40 MG CAPSULE] 90 capsule 0    Sig: TAKE 1 CAPSULE BY MOUTH EVERY DAY     Gastroenterology: Proton Pump Inhibitors Passed - 12/21/2022  2:55 AM      Passed - Valid encounter within last 12 months    Recent Outpatient Visits           8 months ago Hearing loss of left ear, unspecified hearing loss type   [place] [facility] [place], Netta Neat, DO   12 months ago Morbid obesity with BMI of 50.0-59.9, adult [facility])   [facility] Smitty Cords, DO   1 year ago Tinea corporis   [place] [facility] Smitty Cords, DO   1 year ago Degenerative lumbar spinal stenosis   [facility] Smitty Cords, DO   1 year ago Hearing loss of left ear, unspecified hearing loss type   [facility] [place], Netta Neat, DO       Future Appointments             Tomorrow Erin Fulling, MD [facility] at [facility]

## 2022-12-22 NOTE — Telephone Encounter (Signed)
Lm for patient to request that she bring SD card to 12/23/2022 visit.

## 2022-12-23 ENCOUNTER — Ambulatory Visit: Payer: Medicare HMO | Admitting: Internal Medicine

## 2022-12-23 ENCOUNTER — Encounter: Payer: Self-pay | Admitting: Internal Medicine

## 2022-12-23 VITALS — BP 122/76 | HR 73 | Temp 97.7°F | Ht 63.0 in | Wt 284.0 lb

## 2022-12-23 DIAGNOSIS — G4733 Obstructive sleep apnea (adult) (pediatric): Secondary | ICD-10-CM | POA: Diagnosis not present

## 2022-12-23 DIAGNOSIS — R413 Other amnesia: Secondary | ICD-10-CM | POA: Diagnosis not present

## 2022-12-23 NOTE — Telephone Encounter (Signed)
Spoke to patient. She stated that she is currently wearing cpap nightly. She will bring SD card to visit.  Nothing further needed.

## 2022-12-23 NOTE — Progress Notes (Signed)
  ID: Burnard Bunting, female    DOB: April 18, 1946, 77 y.o.   MRN: [id number]     CC  Follow-up OSA  HPI: 77 yo female seen for sleep consult 10/2019 to establish for for OSA (diagnosed around 2011)  History of morbid obesity s/p gastric bypass surgery , lost 100lbs but gained back.  Patient diagnosed with sleep apnea and on CPAP several visits in the past showed excellent compliance  She endorses using nasal mask. She believes CPAP pressure 6cm h20   12/23/2022 Follow up : OSA  I last saw patient approximately 3 years ago She establish care for sleep apnea She has been on CPAP for a long time and does well with CPAP  No exacerbation at this time No evidence of heart failure at this time No evidence or signs of infection at this time No respiratory distress No fevers, chills, nausea, vomiting, diarrhea No evidence of lower extremity edema No evidence hemoptysis    No Known Allergies  Immunization History  Administered Date(s) Administered   Influenza, High Dose Seasonal PF 05/12/2019, 05/29/2020, 08/05/2021   PFIZER(Purple Top)SARS-COV-2 Vaccination 10/22/2019, 11/12/2019, 06/15/2020   Pfizer Covid-19 Vaccine Bivalent Booster 76yrs & up 08/16/2021   Pneumococcal Conjugate-13 12/15/2018   Zoster Recombinat (Shingrix) 12/15/2018, 02/14/2019    Past Medical History:  Diagnosis Date   Allergy    Anxiety    Cancer (HCC)    basil cell   Colon polyp    Depression    Eczema    GERD (gastroesophageal reflux disease)    Kidney stones    Psoriasis    Sleep apnea    Tremor     Tobacco History: Social History   Tobacco Use  Smoking Status Never  Smokeless Tobacco Never   Counseling given: Not Answered   Outpatient Medications Prior to Visit  Medication Sig Dispense Refill   atorvastatin (LIPITOR) 10 MG tablet TAKE 1 TABLET BY MOUTH EVERY DAY 90 tablet 1   ciclopirox (LOPROX) 0.77 % cream Apply topically 2 (two) times daily. 2-4 weeks. 30 g 1    cyclobenzaprine (FLEXERIL) 10 MG tablet Take 1 tablet (10 mg total) by mouth at bedtime as needed for muscle spasms. 30 tablet 2   desoximetasone (TOPICORT) 0.25 % cream Apply 1 Application topically 2 (two) times daily.     diclofenac Sodium (VOLTAREN) 1 % GEL Apply 2 g topically 4 (four) times daily as needed. 100 g 2   escitalopram (LEXAPRO) 20 MG tablet Take 1 tablet (20 mg total) by mouth daily. 90 tablet 3   fluticasone (FLONASE) 50 MCG/ACT nasal spray PLACE 2 SPRAYS INTO BOTH NOSTRILS DAILY. USE FOR 4-6 WEEKS THEN STOP AND USE SEASONALLY OR AS NEEDED. 48 mL 1   lidocaine (LIDODERM) 5 % PLEASE SEE ATTACHED FOR DETAILED DIRECTIONS     metFORMIN (GLUCOPHAGE) 500 MG tablet Take 1 tablet (500 mg total) by mouth 2 (two) times daily with a meal. 180 tablet 3   methocarbamol (ROBAXIN) 750 MG tablet Take 1 tablet (750 mg total) by mouth every 8 (eight) hours as needed for muscle spasms. 270 tablet 3   metoprolol succinate (TOPROL-XL) 25 MG 24 hr tablet Take 1 tablet (25 mg total) by mouth daily. 90 tablet 3   nystatin cream (MYCOSTATIN) APPLY A THIN LAYER TO AFFECTED AREA TWICE DAILY TO FOLDS     omeprazole (PRILOSEC) 40 MG capsule TAKE 1 CAPSULE BY MOUTH EVERY DAY 90 capsule 0   triamcinolone (KENALOG) 0.025 % cream 1(ONE) APPLICATION(S) TOPICAL  2(TWO) TIMES A DAY TO FOLDS AND LOWER EYELID     triamcinolone cream (KENALOG) 0.5 % Apply 1 application topically 2 (two) times daily. To affected areas, for up to 2 weeks. 30 g 2   No facility-administered medications prior to visit.    BP 122/76 (BP Location: Left Arm, Cuff Size: Normal)   Pulse 73   Temp 97.7 F (36.5 C) (Temporal)   Ht  (1.6 m)   Wt 284 lb (128.8 kg)   SpO2 95%   BMI 50.31 kg/m    Review of Systems: Gen:  Denies  fever, sweats, chills weight loss  HEENT: Denies blurred vision, double vision, ear pain, eye pain, hearing loss, nose bleeds, sore throat Cardiac:  No dizziness, chest pain or heaviness, chest  tightness,edema, No JVD Resp:   No cough, -sputum production, -shortness of breath,-wheezing, -hemoptysis,  Other:  All other systems negative    Physical Examination:   General Appearance: No distress  EYES PERRLA, EOM intact.   NECK Supple, No JVD Pulmonary: normal breath sounds, No wheezing.  CardiovascularNormal S1,S2.  No m/r/g.   Abdomen: Benign, Soft, non-tender. Neurology UE/LE 5/5 strength, no focal deficits Ext pulses intact, cap refill intact ALL OTHER ROS ARE NEGATIVE   Assessment & Plan:   77 year old pleasant white female seen today for follow-up assessment for sleep apnea in the setting of obesity  Regarding sleep apnea Continue CPAP as prescribed Patient uses and benefits from CPAP machine Patient is compliant   Will need new machine at this time  Obesity -recommend significant weight loss -recommend changing diet  Deconditioned state -Recommend increased daily activity and exercise  Continue CPAP as prescribed  Keep up the great work!!  Referral to Dr. Sherryll Burger neurology to assess for dementia  Recommend weight loss Recommend water aerobics  Patient  satisfied with Plan of action and management. All questions answered  Follow up  1 year  Total Time Spent  24 mins   Macky Galik Santiago Glad, M.D.  [facility]  Medical Director [facility] Medical Director [facility] Cardio-Pulmonary Department

## 2022-12-23 NOTE — Telephone Encounter (Signed)
Seems like encounter was open in error so closing encounter.  

## 2022-12-23 NOTE — Patient Instructions (Addendum)
Continue CPAP as prescribed  Keep up the great work!!  Referral to Dr. Sherryll Burger neurology to assess for dementia  Recommend weight loss Recommend water aerobics

## 2023-01-14 ENCOUNTER — Telehealth: Payer: Self-pay | Admitting: Family Medicine

## 2023-01-14 DIAGNOSIS — L219 Seborrheic dermatitis, unspecified: Secondary | ICD-10-CM

## 2023-01-14 NOTE — Telephone Encounter (Signed)
New referral signed for Dermatology in [place].  Saralyn Pilar, DO [facility] 01/14/2023, 4:57 PM

## 2023-01-14 NOTE — Telephone Encounter (Signed)
Pt is calling to request a new referral for a dermatologist. Pt would like a dermatologist near [place]. Please advise [phone]

## 2023-01-26 ENCOUNTER — Other Ambulatory Visit: Payer: Self-pay | Admitting: Family Medicine

## 2023-01-26 DIAGNOSIS — F411 Generalized anxiety disorder: Secondary | ICD-10-CM

## 2023-01-26 NOTE — Telephone Encounter (Signed)
Requested Prescriptions  Pending Prescriptions Disp Refills   escitalopram (LEXAPRO) 20 MG tablet [Pharmacy Med Name: ESCITALOPRAM 20 MG TABLET] 60 tablet 0    Sig: TAKE 1 TABLET BY MOUTH EVERY DAY     Psychiatry:  Antidepressants - SSRI Failed - 01/26/2023  2:21 AM      Failed - Valid encounter within last 6 months    Recent Outpatient Visits           10 months ago Hearing loss of left ear, unspecified hearing loss type   [facility] [place], Netta Neat, DO   1 year ago Morbid obesity with BMI of 50.0-59.9, adult [facility])   [place] [facility] Smitty Cords, DO   1 year ago Tinea corporis   [place] [facility] Smitty Cords, DO   1 year ago Degenerative lumbar spinal stenosis    [facility] Smitty Cords, DO   2 years ago Hearing loss of left ear, unspecified hearing loss type   [facility] [place], Netta Neat, [place]

## 2023-01-28 ENCOUNTER — Other Ambulatory Visit: Payer: Self-pay | Admitting: Family Medicine

## 2023-01-28 DIAGNOSIS — E782 Mixed hyperlipidemia: Secondary | ICD-10-CM

## 2023-01-28 NOTE — Telephone Encounter (Signed)
Requested Prescriptions  Pending Prescriptions Disp Refills   atorvastatin (LIPITOR) 10 MG tablet [Pharmacy Med Name: ATORVASTATIN 10 MG TABLET] 90 tablet 0    Sig: TAKE 1 TABLET BY MOUTH EVERY DAY     Cardiovascular:  Antilipid - Statins Failed - 01/28/2023  2:34 AM      Failed - Lipid Panel in normal range within the last 12 months    Cholesterol  Date Value Ref Range Status  03/24/2022 164 <200 mg/dL Final   LDL Cholesterol (Calc)  Date Value Ref Range Status  03/24/2022 101 (H) mg/dL (calc) Final    Comment:    Reference range: <100 . Desirable range <100 mg/dL for primary prevention;   <70 mg/dL for patients with CHD or diabetic patients  with > or = 2 CHD risk factors. Marland Kitchen LDL-C is now calculated using the Martin-Hopkins  calculation, which is a validated novel method providing  better accuracy than the Friedewald equation in the  estimation of LDL-C.  Horald Pollen et al. Lenox Ahr. 1610;960(45): 2061-2068  (http://education.[facility].com/faq/FAQ164)    HDL  Date Value Ref Range Status  03/24/2022 33 (L) > OR = 50 mg/dL Final   Triglycerides  Date Value Ref Range Status  03/24/2022 182 (H) <150 mg/dL Final         Passed - Patient is not pregnant      Passed - Valid encounter within last 12 months    Recent Outpatient Visits           10 months ago Hearing loss of left ear, unspecified hearing loss type   [facility] [place], Netta Neat, DO   1 year ago Morbid obesity with BMI of 50.0-59.9, adult [facility])   [place] [facility] Smitty Cords, DO   1 year ago Tinea corporis   [place] [facility] Smitty Cords, DO   1 year ago Degenerative lumbar spinal stenosis    [facility] Smitty Cords, DO   2 years ago Hearing loss of left ear, unspecified hearing loss type   [facility] [place], Netta Neat, [place]

## 2023-02-11 DIAGNOSIS — G4733 Obstructive sleep apnea (adult) (pediatric): Secondary | ICD-10-CM | POA: Diagnosis not present

## 2023-02-17 ENCOUNTER — Other Ambulatory Visit: Payer: Self-pay | Admitting: Family Medicine

## 2023-02-17 DIAGNOSIS — E1169 Type 2 diabetes mellitus with other specified complication: Secondary | ICD-10-CM

## 2023-02-17 DIAGNOSIS — K219 Gastro-esophageal reflux disease without esophagitis: Secondary | ICD-10-CM

## 2023-02-17 DIAGNOSIS — E782 Mixed hyperlipidemia: Secondary | ICD-10-CM

## 2023-02-18 NOTE — Telephone Encounter (Signed)
All meds asking too early. Pt needs to keep appt that is upcoming  Last RF omeprazole: 12/22/22 #90 Metformin: 03/31/22 #180 3 RF Atorvastatin: 01/28/23 #90   Requested Prescriptions  Refused Prescriptions Disp Refills   omeprazole (PRILOSEC) 40 MG capsule [Pharmacy Med Name: OMEPRAZOLE DR 40 MG CAPSULE] 90 capsule 0    Sig: TAKE 1 CAPSULE BY MOUTH EVERY DAY     Gastroenterology: Proton Pump Inhibitors Passed - 02/17/2023  5:36 PM      Passed - Valid encounter within last 12 months    Recent Outpatient Visits           10 months ago Hearing loss of left ear, unspecified hearing loss type   [place] [facility] Smitty Cords, DO   1 year ago Morbid obesity with BMI of 50.0-59.9, adult [facility])   [place] [facility] Smitty Cords, DO   1 year ago Tinea corporis   [facility] S[facility], DO   2 years ago Degenerative lumbar spinal stenosis   [place] [facility] [place], Netta Neat, DO   2 years ago Hearing loss of left ear, unspecified hearing loss type   [facility] [place], Netta Neat, DO               metFORMIN (GLUCOPHAGE) 500 MG tablet [Pharmacy Med Name: METFORMIN HCL 500 MG TABLET] 180 tablet 3    Sig: TAKE 1 TABLET BY MOUTH 2 TIMES DAILY WITH A MEAL.     Endocrinology:  Diabetes - Biguanides Failed - 02/17/2023  5:36 PM      Failed - HBA1C is between 0 and 7.9 and within 180 days    Hgb A1c MFr Bld  Date Value Ref Range Status  03/24/2022 7.7 (H) <5.7 % of total Hgb Final    Comment:    For someone without known diabetes, a hemoglobin A1c value of 6.5% or greater indicates that they may have  diabetes and this should be confirmed with a follow-up  test. . For someone with known diabetes, a value <7% indicates  that their diabetes is well controlled and a value  greater than or equal to 7% indicates suboptimal   control. A1c targets should be individualized based on  duration of diabetes, age, comorbid conditions, and  other considerations. . Currently, no consensus exists regarding use of hemoglobin A1c for diagnosis of diabetes for children. .          Failed - B12 Level in normal range and within 720 days    No results found for: "VITAMINB12"       Failed - Valid encounter within last 6 months    Recent Outpatient Visits           10 months ago Hearing loss of left ear, unspecified hearing loss type   [place] [facility] [place], Netta Neat, DO   1 year ago Morbid obesity with BMI of 50.0-59.9, adult [facility])   [place] [facility] Smitty Cords, DO   1 year ago Tinea corporis   [facility] Smit[facility], DO   2 years ago Degenerative lumbar spinal stenosis    [facility] DO   2 years ago Hearing loss of left ear, unspecified hearing loss type   [facility] [place], Netta Neat, [place]  Passed - Cr in normal range and within 360 days    Creat  Date Value Ref Range Status  03/24/2022 0.88 0.60 - 1.00 mg/dL Final         Passed - eGFR in normal range and within 360 days    GFR, Est African American  Date Value Ref Range Status  02/10/2021 89 > OR = 60 mL/min/1.29m2 Final   GFR, Est Non African American  Date Value Ref Range Status  02/10/2021 77 > OR = 60 mL/min/1.55m2 Final   eGFR  Date Value Ref Range Status  03/24/2022 68 > OR = 60 mL/min/1.72m2 Final    Comment:    The eGFR is based on the CKD-EPI 2021 equation. To calculate  the new eGFR from a previous Creatinine or Cystatin C result, go to https://www.kidney.org/professionals/ kdoqi/gfr%5Fcalculator          Passed - CBC within normal limits and completed in the last 12 months    WBC  Date Value Ref Range Status   03/24/2022 6.4 3.8 - 10.8 Thousand/uL Final   RBC  Date Value Ref Range Status  03/24/2022 4.26 3.80 - 5.10 Million/uL Final   Hemoglobin  Date Value Ref Range Status  03/24/2022 12.5 11.7 - 15.5 g/dL Final   HCT  Date Value Ref Range Status  03/24/2022 38.9 35.0 - 45.0 % Final   MCHC  Date Value Ref Range Status  03/24/2022 32.1 32.0 - 36.0 g/dL Final   [facility]  Date Value Ref Range Status  03/24/2022 29.3 27.0 - 33.0 pg Final   MCV  Date Value Ref Range Status  03/24/2022 91.3 80.0 - 100.0 fL Final   No results found for: "PLTCOUNTKUC", "LABPLAT", "POCPLA" RDW  Date Value Ref Range Status  03/24/2022 12.7 11.0 - 15.0 % Final          atorvastatin (LIPITOR) 10 MG tablet [Pharmacy Med Name: ATORVASTATIN 10 MG TABLET] 90 tablet 0    Sig: TAKE 1 TABLET BY MOUTH EVERY DAY     Cardiovascular:  Antilipid - Statins Failed - 02/17/2023  5:36 PM      Failed - Lipid Panel in normal range within the last 12 months    Cholesterol  Date Value Ref Range Status  03/24/2022 164 <200 mg/dL Final   LDL Cholesterol (Calc)  Date Value Ref Range Status  03/24/2022 101 (H) mg/dL (calc) Final    Comment:    Reference range: <100 . Desirable range <100 mg/dL for primary prevention;   <70 mg/dL for patients with CHD or diabetic patients  with > or = 2 CHD risk factors. Marland Kitchen LDL-C is now calculated using the Martin-Hopkins  calculation, which is a validated novel method providing  better accuracy than the Friedewald equation in the  estimation of LDL-C.  Horald Pollen et al. Lenox Ahr. 7829;562(13): 2061-2068  (http://education.[facility].com/faq/FAQ164)    HDL  Date Value Ref Range Status  03/24/2022 33 (L) > OR = 50 mg/dL Final   Triglycerides  Date Value Ref Range Status  03/24/2022 182 (H) <150 mg/dL Final         Passed - Patient is not pregnant      Passed - Valid encounter within last 12 months    Recent Outpatient Visits           10 months ago Hearing loss of left  ear, unspecified hearing loss type   [facility] [place], Netta Neat, DO   1 year ago Morbid obesity with  BMI of 50.0-59.9, adult [facility])   [place] [facility] Smitty Cords, DO   1 year ago Tinea corporis   [facility] Smitty Cords, DO   2 years ago Degenerative lumbar spinal stenosis   [place] [facility] [place], Netta Neat, DO   2 years ago Hearing loss of left ear, unspecified hearing loss type   [facility] [place], Netta Neat, [place]

## 2023-02-26 ENCOUNTER — Other Ambulatory Visit: Payer: Self-pay | Admitting: Family Medicine

## 2023-02-26 DIAGNOSIS — I10 Essential (primary) hypertension: Secondary | ICD-10-CM

## 2023-02-28 NOTE — Telephone Encounter (Signed)
Requested Prescriptions  Pending Prescriptions Disp Refills   metoprolol succinate (TOPROL-XL) 25 MG 24 hr tablet [Pharmacy Med Name: METOPROLOL SUCC ER 25 MG TAB] 45 tablet 0    Sig: TAKE 1/2 TABLET BY MOUTH EVERY DAY     Cardiovascular:  Beta Blockers Failed - 02/26/2023  8:37 AM      Failed - Valid encounter within last 6 months    Recent Outpatient Visits           11 months ago Hearing loss of left ear, unspecified hearing loss type   [place] [facility] [place], Netta Neat, DO   1 year ago Morbid obesity with BMI of 50.0-59.9, adult [facility])   [facility] S[facility], DO   1 year ago Tinea corporis   [place] [facility] Smit[facility] Cords, DO   2 years ago Degenerative lumbar spinal stenosis   [facility] [place], Netta Neat, DO   2 years ago Hearing loss of left ear, unspecified hearing loss type   [facility] [place], Netta Neat, DO              Passed - Last BP in normal range    BP Readings from Last 1 Encounters:  12/23/22 122/76         Passed - Last Heart Rate in normal range    Pulse Readings from Last 1 Encounters:  12/23/22 73

## 2023-03-12 ENCOUNTER — Other Ambulatory Visit: Payer: Self-pay | Admitting: Family Medicine

## 2023-03-12 DIAGNOSIS — E1169 Type 2 diabetes mellitus with other specified complication: Secondary | ICD-10-CM

## 2023-03-14 NOTE — Telephone Encounter (Signed)
Patient needs OV for additional refills.  Requested Prescriptions  Pending Prescriptions Disp Refills   metFORMIN (GLUCOPHAGE) 500 MG tablet [Pharmacy Med Name: METFORMIN HCL 500 MG TABLET] 60 tablet 0    Sig: TAKE 1 TABLET BY MOUTH 2 TIMES DAILY WITH A MEAL.     Endocrinology:  Diabetes - Biguanides Failed - 03/12/2023  9:20 AM      Failed - HBA1C is between 0 and 7.9 and within 180 days    Hgb A1c MFr Bld  Date Value Ref Range Status  03/24/2022 7.7 (H) <5.7 % of total Hgb Final    Comment:    For someone without known diabetes, a hemoglobin A1c value of 6.5% or greater indicates that they may have  diabetes and this should be confirmed with a follow-up  test. . For someone with known diabetes, a value <7% indicates  that their diabetes is well controlled and a value  greater than or equal to 7% indicates suboptimal  control. A1c targets should be individualized based on  duration of diabetes, age, comorbid conditions, and  other considerations. . Currently, no consensus exists regarding use of hemoglobin A1c for diagnosis of diabetes for children. .          Failed - B12 Level in normal range and within 720 days    No results found for: "VITAMINB12"       Failed - Valid encounter within last 6 months    Recent Outpatient Visits           11 months ago Hearing loss of left ear, unspecified hearing loss type   [facility] [place], Netta Neat, DO   1 year ago Morbid obesity with BMI of 50.0-59.9, adult [facility])   [place] [facility] Smitty Cords, DO   1 year ago Tinea corporis   [place] [facility] Smitty Cords, DO   2 years ago Degenerative lumbar spinal stenosis   [place] [facility] [place], Netta Neat, DO   2 years ago Hearing loss of left ear, unspecified hearing loss type   [facility] [place], Netta Neat, DO               Passed - Cr in normal range and within 360 days    Creat  Date Value Ref Range Status  03/24/2022 0.88 0.60 - 1.00 mg/dL Final         Passed - eGFR in normal range and within 360 days    GFR, Est African American  Date Value Ref Range Status  02/10/2021 89 > OR = 60 mL/min/1.53m2 Final   GFR, Est Non African American  Date Value Ref Range Status  02/10/2021 77 > OR = 60 mL/min/1.51m2 Final   eGFR  Date Value Ref Range Status  03/24/2022 68 > OR = 60 mL/min/1.75m2 Final    Comment:    The eGFR is based on the CKD-EPI 2021 equation. To calculate  the new eGFR from a previous Creatinine or Cystatin C result, go to https://www.kidney.org/professionals/ kdoqi/gfr%5Fcalculator          Passed - CBC within normal limits and completed in the last 12 months    WBC  Date Value Ref Range Status  03/24/2022 6.4 3.8 - 10.8 Thousand/uL Final   RBC  Date Value Ref Range Status  03/24/2022 4.26 3.80 - 5.10 Million/uL Final   Hemoglobin  Date Value Ref Range Status  03/24/2022 12.5 11.7 - 15.5 g/dL Final   HCT  Date Value Ref Range Status  03/24/2022 38.9 35.0 - 45.0 % Final   MCHC  Date Value Ref Range Status  03/24/2022 32.1 32.0 - 36.0 g/dL Final   [facility]  Date Value Ref Range Status  03/24/2022 29.3 27.0 - 33.0 pg Final   MCV  Date Value Ref Range Status  03/24/2022 91.3 80.0 - 100.0 fL Final   No results found for: "PLTCOUNTKUC", "LABPLAT", "POCPLA" RDW  Date Value Ref Range Status  03/24/2022 12.7 11.0 - 15.0 % Final

## 2023-03-22 ENCOUNTER — Other Ambulatory Visit: Payer: Self-pay | Admitting: Family Medicine

## 2023-03-22 DIAGNOSIS — F411 Generalized anxiety disorder: Secondary | ICD-10-CM

## 2023-03-22 DIAGNOSIS — L400 Psoriasis vulgaris: Secondary | ICD-10-CM | POA: Diagnosis not present

## 2023-03-22 DIAGNOSIS — L408 Other psoriasis: Secondary | ICD-10-CM | POA: Diagnosis not present

## 2023-03-23 NOTE — Telephone Encounter (Signed)
Requested medication (s) are due for refill today: yes  Requested medication (s) are on the active medication list: yes  Last refill:  01/26/23 #60  Future visit scheduled:no  Notes to clinic:  called pt to make appt- LM on VM to call back to schedule   Requested Prescriptions  Pending Prescriptions Disp Refills   escitalopram (LEXAPRO) 20 MG tablet [Pharmacy Med Name: ESCITALOPRAM 20 MG TABLET] 90 tablet 1    Sig: TAKE 1 TABLET BY MOUTH EVERY DAY     Psychiatry:  Antidepressants - SSRI Failed - 03/22/2023  2:32 PM      Failed - Valid encounter within last 6 months    Recent Outpatient Visits           11 months ago Hearing loss of left ear, unspecified hearing loss type   [place] [facility] [place], Netta Neat, DO   1 year ago Morbid obesity with BMI of 50.0-59.9, adult [facility])   [place] [facility] Smitty Cords, DO   2 years ago Tinea corporis   [place] [facility] Smit[facility] C[facility], DO   2 years ago Degenerative lumbar spinal stenosis   [place] [facility] Smitty Cords, DO   2 years ago Hearing loss of left ear, unspecified hearing loss type   [facility] [place], Netta Neat, [place]

## 2023-03-30 ENCOUNTER — Other Ambulatory Visit: Payer: Self-pay | Admitting: Family Medicine

## 2023-03-30 DIAGNOSIS — E1169 Type 2 diabetes mellitus with other specified complication: Secondary | ICD-10-CM

## 2023-03-30 DIAGNOSIS — E782 Mixed hyperlipidemia: Secondary | ICD-10-CM

## 2023-03-30 DIAGNOSIS — K219 Gastro-esophageal reflux disease without esophagitis: Secondary | ICD-10-CM

## 2023-03-31 NOTE — Telephone Encounter (Signed)
Requested medications are due for refill today.  yes  Requested medications are on the active medications list.  yes  Last refill. varied  Future visit scheduled.   A wellness visit  Notes to clinic.  Labs are expired.    Requested Prescriptions  Pending Prescriptions Disp Refills   metFORMIN (GLUCOPHAGE) 500 MG tablet [Pharmacy Med Name: METFORMIN HCL 500 MG TABLET] 60 tablet 0    Sig: TAKE 1 TABLET BY MOUTH TWICE A DAY WITH FOOD     Endocrinology:  Diabetes - Biguanides Failed - 03/30/2023  1:49 PM      Failed - Cr in normal range and within 360 days    Creat  Date Value Ref Range Status  03/24/2022 0.88 0.60 - 1.00 mg/dL Final         Failed - HBA1C is between 0 and 7.9 and within 180 days    Hgb A1c MFr Bld  Date Value Ref Range Status  03/24/2022 7.7 (H) <5.7 % of total Hgb Final    Comment:    For someone without known diabetes, a hemoglobin A1c value of 6.5% or greater indicates that they may have  diabetes and this should be confirmed with a follow-up  test. . For someone with known diabetes, a value <7% indicates  that their diabetes is well controlled and a value  greater than or equal to 7% indicates suboptimal  control. A1c targets should be individualized based on  duration of diabetes, age, comorbid conditions, and  other considerations. . Currently, no consensus exists regarding use of hemoglobin A1c for diagnosis of diabetes for children. .          Failed - eGFR in normal range and within 360 days    GFR, Est African American  Date Value Ref Range Status  02/10/2021 89 > OR = 60 mL/min/1.60m2 Final   GFR, Est Non African American  Date Value Ref Range Status  02/10/2021 77 > OR = 60 mL/min/1.66m2 Final   eGFR  Date Value Ref Range Status  03/24/2022 68 > OR = 60 mL/min/1.66m2 Final    Comment:    The eGFR is based on the CKD-EPI 2021 equation. To calculate  the new eGFR from a previous Creatinine or Cystatin C result, go to  https://www.kidney.org/professionals/ kdoqi/gfr%5Fcalculator          Failed - B12 Level in normal range and within 720 days    No results found for: "VITAMINB12"       Failed - Valid encounter within last 6 months    Recent Outpatient Visits           1 year ago Hearing loss of left ear, unspecified hearing loss type   [place] [facility] [place], Netta Neat, DO   1 year ago Morbid obesity with BMI of 50.0-59.9, adult [facility])   [facility] Smit[facility], DO   2 years ago Tinea corporis   [place] [facility] Smit[facility] Cords, DO   2 years ago Degenerative lumbar spinal stenosis   [facility] [place], Netta Neat, DO   2 years ago Hearing loss of left ear, unspecified hearing loss type   [facility] [place], Netta Neat, DO              Failed - CBC within normal limits and completed in the last 12 months    WBC  Date Value Ref  Range Status  03/24/2022 6.4 3.8 - 10.8 Thousand/uL Final   RBC  Date Value Ref Range Status  03/24/2022 4.26 3.80 - 5.10 Million/uL Final   Hemoglobin  Date Value Ref Range Status  03/24/2022 12.5 11.7 - 15.5 g/dL Final   HCT  Date Value Ref Range Status  03/24/2022 38.9 35.0 - 45.0 % Final   MCHC  Date Value Ref Range Status  03/24/2022 32.1 32.0 - 36.0 g/dL Final   [facility]  Date Value Ref Range Status  03/24/2022 29.3 27.0 - 33.0 pg Final   MCV  Date Value Ref Range Status  03/24/2022 91.3 80.0 - 100.0 fL Final   No results found for: "PLTCOUNTKUC", "LABPLAT", "POCPLA" RDW  Date Value Ref Range Status  03/24/2022 12.7 11.0 - 15.0 % Final          atorvastatin (LIPITOR) 10 MG tablet [Pharmacy Med Name: ATORVASTATIN 10 MG TABLET] 90 tablet 0    Sig: TAKE 1 TABLET BY MOUTH EVERY DAY     Cardiovascular:  Antilipid - Statins Failed - 03/30/2023  1:49 PM      Failed - Valid  encounter within last 12 months    Recent Outpatient Visits           1 year ago Hearing loss of left ear, unspecified hearing loss type   [facility] Smitty Cords, DO   1 year ago Morbid obesity with BMI of 50.0-59.9, adult [facility])   [place] [facility] Smitty Cords, DO   2 years ago Tinea corporis   [place] [facility] Smitty Cords, DO   2 years ago Degenerative lumbar spinal stenosis   [place] [facility], Netta Neat, DO   2 years ago Hearing loss of left ear, unspecified hearing loss type   [facility] [place], Netta Neat, DO              Failed - Lipid Panel in normal range within the last 12 months    Cholesterol  Date Value Ref Range Status  03/24/2022 164 <200 mg/dL Final   LDL Cholesterol (Calc)  Date Value Ref Range Status  03/24/2022 101 (H) mg/dL (calc) Final    Comment:    Reference range: <100 . Desirable range <100 mg/dL for primary prevention;   <70 mg/dL for patients with CHD or diabetic patients  with > or = 2 CHD risk factors. Marland Kitchen LDL-C is now calculated using the Martin-Hopkins  calculation, which is a validated novel method providing  better accuracy than the Friedewald equation in the  estimation of LDL-C.  Horald Pollen et al. Lenox Ahr. 1191;478(29): 2061-2068  (http://education.[facility].com/faq/FAQ164)    HDL  Date Value Ref Range Status  03/24/2022 33 (L) > OR = 50 mg/dL Final   Triglycerides  Date Value Ref Range Status  03/24/2022 182 (H) <150 mg/dL Final         Passed - Patient is not pregnant       omeprazole (PRILOSEC) 40 MG capsule [Pharmacy Med Name: OMEPRAZOLE DR 40 MG CAPSULE] 90 capsule 0    Sig: TAKE 1 CAPSULE BY MOUTH EVERY DAY     Gastroenterology: Proton Pump Inhibitors Failed - 03/30/2023  1:49 PM      Failed - Valid encounter within last 12 months     Recent Outpatient Visits           1 year ago Hearing loss  of left ear, unspecified hearing loss type   [place] [facility] [place], Netta Neat, DO   1 year ago Morbid obesity with BMI of 50.0-59.9, adult [facility])   [place] [facility] Smitty Cords, DO   2 years ago Tinea corporis   [place] [facility] Smitty Cords, DO   2 years ago Degenerative lumbar spinal stenosis    [facility] S[facility], DO   2 years ago Hearing loss of left ear, unspecified hearing loss type   [facility] [place], Netta Neat, [place]

## 2023-04-01 ENCOUNTER — Ambulatory Visit (INDEPENDENT_AMBULATORY_CARE_PROVIDER_SITE_OTHER): Payer: Medicare HMO

## 2023-04-01 VITALS — Ht 63.0 in | Wt 284.0 lb

## 2023-04-01 DIAGNOSIS — Z0000 Encounter for general adult medical examination without abnormal findings: Secondary | ICD-10-CM | POA: Diagnosis not present

## 2023-04-01 NOTE — Patient Instructions (Addendum)
Natasha Curry , Thank you for taking time to come for your Medicare Wellness Visit. I appreciate your ongoing commitment to your health goals. Please review the following plan we discussed and let me know if I can assist you in the future.   Referrals/Orders/Follow-Ups/Clinician Recommendations: none  This is a list of the screening recommended for you and due dates:  Health Maintenance  Topic Date Due   Complete foot exam   Never done   Eye exam for diabetics  Never done   Yearly kidney health urinalysis for diabetes  Never done   DTaP/Tdap/Td vaccine (1 - Tdap) Never done   Pneumonia Vaccine (2 of 2 - PPSV23 or PCV20) 12/15/2019   COVID-19 Vaccine (5 - 2023-24 season) 05/07/2022   Hemoglobin A1C  09/24/2022   Yearly kidney function blood test for diabetes  03/25/2023   Hepatitis C Screening  02/11/2024*   Flu Shot  04/07/2023   Medicare Annual Wellness Visit  03/31/2024   DEXA scan (bone density measurement)  Completed   Zoster (Shingles) Vaccine  Completed   HPV Vaccine  Aged Out   Colon Cancer Screening  Discontinued  *Topic was postponed. The date shown is not the original due date.    Advanced directives: (Copy Requested) Please bring a copy of your health care power of attorney and living will to the office to be added to your chart at your convenience.  Next Medicare Annual Wellness Visit scheduled for next year: Yes   04/06/24 @ 9:15 am by phone  Preventive Care 65 Years and Older, Female Preventive care refers to lifestyle choices and visits with your health care provider that can promote health and wellness. What does preventive care include? A yearly physical exam. This is also called an annual well check. Dental exams once or twice a year. Routine eye exams. Ask your health care provider how often you should have your eyes checked. Personal lifestyle choices, including: Daily care of your teeth and gums. Regular physical activity. Eating a healthy diet. Avoiding  tobacco and drug use. Limiting alcohol use. Practicing safe sex. Taking low-dose aspirin every day. Taking vitamin and mineral supplements as recommended by your health care provider. What happens during an annual well check? The services and screenings done by your health care provider during your annual well check will depend on your age, overall health, lifestyle risk factors, and family history of disease. Counseling  Your health care provider may ask you questions about your: Alcohol use. Tobacco use. Drug use. Emotional well-being. Home and relationship well-being. Sexual activity. Eating habits. History of falls. Memory and ability to understand (cognition). Work and work Astronomer. Reproductive health. Screening  You may have the following tests or measurements: Height, weight, and BMI. Blood pressure. Lipid and cholesterol levels. These may be checked every 5 years, or more frequently if you are over 33 years old. Skin check. Lung cancer screening. You may have this screening every year starting at age 74 if you have a 30-pack-year history of smoking and currently smoke or have quit within the past 15 years. Fecal occult blood test (FOBT) of the stool. You may have this test every year starting at age 30. Flexible sigmoidoscopy or colonoscopy. You may have a sigmoidoscopy every 5 years or a colonoscopy every 10 years starting at age 58. Hepatitis C blood test. Hepatitis B blood test. Sexually transmitted disease (STD) testing. Diabetes screening. This is done by checking your blood sugar (glucose) after you have not eaten for a while (  fasting). You may have this done every 1-3 years. Bone density scan. This is done to screen for osteoporosis. You may have this done starting at age 63. Mammogram. This may be done every 1-2 years. Talk to your health care provider about how often you should have regular mammograms. Talk with your health care provider about your test  results, treatment options, and if necessary, the need for more tests. Vaccines  Your health care provider may recommend certain vaccines, such as: Influenza vaccine. This is recommended every year. Tetanus, diphtheria, and acellular pertussis (Tdap, Td) vaccine. You may need a Td booster every 10 years. Zoster vaccine. You may need this after age 72. Pneumococcal 13-valent conjugate (PCV13) vaccine. One dose is recommended after age 74. Pneumococcal polysaccharide (PPSV23) vaccine. One dose is recommended after age 15. Talk to your health care provider about which screenings and vaccines you need and how often you need them. This information is not intended to replace advice given to you by your health care provider. Make sure you discuss any questions you have with your health care provider. Document Released: 09/19/2015 Document Revised: 05/12/2016 Document Reviewed: 06/24/2015 Elsevier Interactive Patient Education  2017 [employer].  Fall Prevention in the Home Falls can cause injuries. They can happen to people of all ages. There are many things you can do to make your home safe and to help prevent falls. What can I do on the outside of my home? Regularly fix the edges of walkways and driveways and fix any cracks. Remove anything that might make you trip as you walk through a door, such as a raised step or threshold. Trim any bushes or trees on the path to your home. Use bright outdoor lighting. Clear any walking paths of anything that might make someone trip, such as rocks or tools. Regularly check to see if handrails are loose or broken. Make sure that both sides of any steps have handrails. Any raised decks and porches should have guardrails on the edges. Have any leaves, snow, or ice cleared regularly. Use sand or salt on walking paths during winter. Clean up any spills in your garage right away. This includes oil or grease spills. What can I do in the bathroom? Use night  lights. Install grab bars by the toilet and in the tub and shower. Do not use towel bars as grab bars. Use non-skid mats or decals in the tub or shower. If you need to sit down in the shower, use a plastic, non-slip stool. Keep the floor dry. Clean up any water that spills on the floor as soon as it happens. Remove soap buildup in the tub or shower regularly. Attach bath mats securely with double-sided non-slip rug tape. Do not have throw rugs and other things on the floor that can make you trip. What can I do in the bedroom? Use night lights. Make sure that you have a light by your bed that is easy to reach. Do not use any sheets or blankets that are too big for your bed. They should not hang down onto the floor. Have a firm chair that has side arms. You can use this for support while you get dressed. Do not have throw rugs and other things on the floor that can make you trip. What can I do in the kitchen? Clean up any spills right away. Avoid walking on wet floors. Keep items that you use a lot in easy-to-reach places. If you need to reach something above you, use  a strong step stool that has a grab bar. Keep electrical cords out of the way. Do not use floor polish or wax that makes floors slippery. If you must use wax, use non-skid floor wax. Do not have throw rugs and other things on the floor that can make you trip. What can I do with my stairs? Do not leave any items on the stairs. Make sure that there are handrails on both sides of the stairs and use them. Fix handrails that are broken or loose. Make sure that handrails are as long as the stairways. Check any carpeting to make sure that it is firmly attached to the stairs. Fix any carpet that is loose or worn. Avoid having throw rugs at the top or bottom of the stairs. If you do have throw rugs, attach them to the floor with carpet tape. Make sure that you have a light switch at the top of the stairs and the bottom of the stairs. If  you do not have them, ask someone to add them for you. What else can I do to help prevent falls? Wear shoes that: Do not have high heels. Have rubber bottoms. Are comfortable and fit you well. Are closed at the toe. Do not wear sandals. If you use a stepladder: Make sure that it is fully opened. Do not climb a closed stepladder. Make sure that both sides of the stepladder are locked into place. Ask someone to hold it for you, if possible. Clearly mark and make sure that you can see: Any grab bars or handrails. First and last steps. Where the edge of each step is. Use tools that help you move around (mobility aids) if they are needed. These include: Canes. Walkers. Scooters. Crutches. Turn on the lights when you go into a dark area. Replace any light bulbs as soon as they burn out. Set up your furniture so you have a clear path. Avoid moving your furniture around. If any of your floors are uneven, fix them. If there are any pets around you, be aware of where they are. Review your medicines with your doctor. Some medicines can make you feel dizzy. This can increase your chance of falling. Ask your doctor what other things that you can do to help prevent falls. This information is not intended to replace advice given to you by your health care provider. Make sure you discuss any questions you have with your health care provider. Document Released: 06/19/2009 Document Revised: 01/29/2016 Document Reviewed: 09/27/2014 Elsevier Interactive Patient Education  2017 [employer].

## 2023-04-01 NOTE — Progress Notes (Signed)
Subjective:   Natasha Curry is a 77 y.o. female who presents for Medicare Annual (Subsequent) preventive examination.  Visit Complete: Virtual  I connected with  Korynne Galica on 04/01/23 by a audio enabled telemedicine application and verified that I am speaking with the correct person using two identifiers.  Patient Location: Home  Provider Location: Office/Clinic  I discussed the limitations of evaluation and management by telemedicine. The patient expressed understanding and agreed to proceed.   Review of Systems     Cardiac Risk Factors include: advanced age (>35men, >82 women);hypertension;sedentary lifestyle;obesity (BMI >30kg/m2);diabetes mellitus;dyslipidemia     Objective:    Today's Vitals   04/01/23 1011  Weight: 284 lb (128.8 kg)  Height: 5\' 3"  (1.6 m)   Body mass index is 50.31 kg/m.     04/01/2023   10:01 AM 03/26/2022   11:00 AM 03/24/2021   11:08 AM 04/07/2020   11:14 AM  Advanced Directives  Does Patient Have a Medical Advance Directive? Yes No Yes No  Type of Estate agent of [place];Living will  Healthcare Power of [place];Living will   Copy of Healthcare Power of Attorney in Chart? No - copy requested  No - copy requested   Would patient like information on creating a medical advance directive? No - Patient declined No - Patient declined  No - Patient declined    Current Medications (verified) Outpatient Encounter Medications as of 04/01/2023  Medication Sig   atorvastatin (LIPITOR) 10 MG tablet TAKE 1 TABLET BY MOUTH EVERY DAY   ciclopirox (LOPROX) 0.77 % cream Apply topically 2 (two) times daily. 2-4 weeks.   cyclobenzaprine (FLEXERIL) 10 MG tablet Take 1 tablet (10 mg total) by mouth at bedtime as needed for muscle spasms.   desoximetasone (TOPICORT) 0.25 % cream Apply 1 Application topically 2 (two) times daily.   diclofenac Sodium (VOLTAREN) 1 % GEL Apply 2 g topically 4 (four) times daily as needed.   escitalopram  (LEXAPRO) 20 MG tablet TAKE 1 TABLET BY MOUTH EVERY DAY   fluticasone (FLONASE) 50 MCG/ACT nasal spray PLACE 2 SPRAYS INTO BOTH NOSTRILS DAILY. USE FOR 4-6 WEEKS THEN STOP AND USE SEASONALLY OR AS NEEDED.   lidocaine (LIDODERM) 5 % PLEASE SEE ATTACHED FOR DETAILED DIRECTIONS   metFORMIN (GLUCOPHAGE) 500 MG tablet TAKE 1 TABLET BY MOUTH TWICE A DAY WITH FOOD   methocarbamol (ROBAXIN) 750 MG tablet Take 1 tablet (750 mg total) by mouth every 8 (eight) hours as needed for muscle spasms.   metoprolol succinate (TOPROL-XL) 25 MG 24 hr tablet TAKE 1/2 TABLET BY MOUTH EVERY DAY   nystatin cream (MYCOSTATIN) APPLY A THIN LAYER TO AFFECTED AREA TWICE DAILY TO FOLDS   omeprazole (PRILOSEC) 40 MG capsule TAKE 1 CAPSULE BY MOUTH EVERY DAY   triamcinolone cream (KENALOG) 0.5 % Apply 1 application topically 2 (two) times daily. To affected areas, for up to 2 weeks.   triamcinolone (KENALOG) 0.025 % cream 1(ONE) APPLICATION(S) TOPICAL 2(TWO) TIMES A DAY TO FOLDS AND LOWER EYELID (Patient not taking: Reported on 04/01/2023)   No facility-administered encounter medications on file as of 04/01/2023.    Allergies (verified) Patient has no known allergies.   History: Past Medical History:  Diagnosis Date   Allergy    Anxiety    Cancer (HCC)    basil cell   Colon polyp    Depression    Eczema    GERD (gastroesophageal reflux disease)    Kidney stones    Psoriasis  Sleep apnea    Tremor    Past Surgical History:  Procedure Laterality Date   CHOLECYSTECTOMY  2000   GASTRIC BYPASS  2013   TOTAL KNEE ARTHROPLASTY  2014   Family History  Problem Relation Age of Onset   Cancer Mother        adrenal   Heart attack Father    Heart disease Father    Stroke Father    Breast cancer Neg Hx    Social History   Socioeconomic History   Marital status: Married    Spouse name: Not on file   Number of children: Not on file   Years of education: Not on file   Highest education level: Not on file   Occupational History   Not on file  Tobacco Use   Smoking status: Never   Smokeless tobacco: Never  Vaping Use   Vaping status: Never Used  Substance and Sexual Activity   Alcohol use: Yes    Alcohol/week: 3.0 standard drinks of alcohol    Types: 3 Standard drinks or equivalent per week   Drug use: Never   Sexual activity: Not on file  Other Topics Concern   Not on file  Social History Narrative   Not on file   Social Determinants of Health   Financial Resource Strain: Low Risk  (04/01/2023)   Overall Financial Resource Strain (CARDIA)    Difficulty of Paying Living Expenses: Not hard at all  Food Insecurity: No Food Insecurity (04/01/2023)   Hunger Vital Sign    Worried About Running Out of Food in the Last Year: Never true    Ran Out of Food in the Last Year: Never true  Transportation Needs: No Transportation Needs (04/01/2023)   PRAPARE - Administrator, Civil Service (Medical): No    Lack of Transportation (Non-Medical): No  Physical Activity: Inactive (04/01/2023)   Exercise Vital Sign    Days of Exercise per Week: 0 days    Minutes of Exercise per Session: 0 min  Stress: No Stress Concern Present (04/01/2023)   [employer] of Occupational Health - Occupational Stress Questionnaire    Feeling of Stress : Not at all  Social Connections: Moderately Isolated (04/01/2023)   Social Connection and Isolation Panel [NHANES]    Frequency of Communication with Friends and Family: More than three times a week    Frequency of Social Gatherings with Friends and Family: Not on file    Attends Religious Services: Never    Database administrator or Organizations: No    Attends Engineer, structural: Never    Marital Status: Married    Tobacco Counseling Counseling given: Not Answered   Clinical Intake:  Pre-visit preparation completed: Yes  Pain : No/denies pain     Nutritional Risks: None Diabetes: Yes CBG done?: No Did pt. bring in CBG  monitor from home?: No  How often do you need to have someone help you when you read instructions, pamphlets, or other written materials from your doctor or pharmacy?: 1 - Never  Interpreter Needed?: No  Information entered by :: Kennedy Bucker, LPN   Activities of Daily Living    04/01/2023   10:03 AM 03/25/2023   10:45 AM  In your present state of health, do you have any difficulty performing the following activities:  Hearing? 1 0  Vision? 0 0  Difficulty concentrating or making decisions? 0 0  Walking or climbing stairs? 0 0  Dressing or bathing? 0  0  Doing errands, shopping? 0 0  Preparing Food and eating ? N N  Using the Toilet? N N  In the past six months, have you accidently leaked urine? N N  Do you have problems with loss of bowel control? N N  Managing your Medications? N N  Managing your Finances? N N  Housekeeping or managing your Housekeeping? N N    Patient Care Team: Smitty Cords, DO as PCP - General (Family Medicine)  Indicate any recent Medical Services you may have received from other than [facility] providers in the past year (date may be approximate).     Assessment:   This is a routine wellness examination for [place].  Hearing/Vision screen Hearing Screening - Comments:: Wears aids Vision Screening - Comments:: Readers-   Dietary issues and exercise activities discussed:     Goals Addressed             This Visit's Progress    DIET - EAT MORE FRUITS AND VEGETABLES         Depression Screen    04/01/2023   10:00 AM 03/31/2022    9:53 AM 03/26/2022   10:58 AM 12/23/2021    8:27 AM 03/24/2021   11:09 AM 02/10/2021   10:49 AM 01/07/2021    1:27 PM  PHQ 2/9 Scores  PHQ - 2 Score 0 0 0 0 0 0 0  PHQ- 9 Score 0 7 0 3  1 1     Fall Risk    04/01/2023   10:03 AM 03/25/2023   10:45 AM 03/31/2022    9:52 AM 03/26/2022   11:00 AM 12/23/2021    8:27 AM  Fall Risk   Falls in the past year? 1 1 1 1  0  Number falls in past yr: 0 0 0 0 0   Injury with Fall? 0 1 1 1  0  Risk for fall due to : History of fall(s)  History of fall(s) History of fall(s) History of fall(s)  Follow up Falls evaluation completed;Falls prevention discussed  Falls evaluation completed Falls evaluation completed;Falls prevention discussed Falls evaluation completed    MEDICARE RISK AT HOME:  Medicare Risk at Home - 04/01/23 1004     Any stairs in or around the home? Yes    If so, are there any without handrails? No    Home free of loose throw rugs in walkways, pet beds, electrical cords, etc? Yes    Adequate lighting in your home to reduce risk of falls? Yes    Life alert? No    Use of a cane, walker or w/c? No    Grab bars in the bathroom? Yes    Shower chair or bench in shower? No    Elevated toilet seat or a handicapped toilet? No             TIMED UP AND GO:  Was the test performed?  No    Cognitive Function:        04/01/2023   10:04 AM 03/26/2022   11:01 AM 03/24/2021   11:12 AM  6CIT Screen  What Year? 0 points 0 points 0 points  What month? 0 points 0 points 0 points  What time? 0 points 0 points 3 points  Count back from 20 0 points 0 points 0 points  Months in reverse 0 points 0 points 2 points  Repeat phrase 0 points 0 points 2 points  Total Score 0 points 0 points 7 points  Immunizations Immunization History  Administered Date(s) Administered   Fluad Quad(high Dose 65+) 05/19/2022   Influenza, High Dose Seasonal PF 05/12/2019, 05/29/2020, 08/05/2021   PFIZER(Purple Top)SARS-COV-2 Vaccination 10/22/2019, 11/12/2019, 06/15/2020   Pfizer Covid-19 Vaccine Bivalent Booster 73yrs & up 08/16/2021   Pneumococcal Conjugate-13 12/15/2018   Zoster Recombinant(Shingrix) 12/15/2018, 02/14/2019    TDAP status: Due, Education has been provided regarding the importance of this vaccine. Advised may receive this vaccine at local pharmacy or Health Dept. Aware to provide a copy of the vaccination record if obtained from local  pharmacy or Health Dept. Verbalized acceptance and understanding.  Flu Vaccine status: Up to date  Pneumococcal vaccine status: Up to date  Covid-19 vaccine status: Completed vaccines  Qualifies for Shingles Vaccine? Yes   Zostavax completed No   Shingrix Completed?: Yes  Screening Tests Health Maintenance  Topic Date Due   FOOT EXAM  Never done   OPHTHALMOLOGY EXAM  Never done   Diabetic kidney evaluation - Urine ACR  Never done   DTaP/Tdap/Td (1 - Tdap) Never done   Pneumonia Vaccine 88+ Years old (2 of 2 - PPSV23 or PCV20) 12/15/2019   COVID-19 Vaccine (5 - 2023-24 season) 05/07/2022   HEMOGLOBIN A1C  09/24/2022   Diabetic kidney evaluation - eGFR measurement  03/25/2023   Hepatitis C Screening  02/11/2024 (Originally 02/14/1964)   INFLUENZA VACCINE  04/07/2023   Medicare Annual Wellness (AWV)  03/31/2024   DEXA SCAN  Completed   Zoster Vaccines- Shingrix  Completed   HPV VACCINES  Aged Out   Colonoscopy  Discontinued    Health Maintenance  Health Maintenance Due  Topic Date Due   FOOT EXAM  Never done   OPHTHALMOLOGY EXAM  Never done   Diabetic kidney evaluation - Urine ACR  Never done   DTaP/Tdap/Td (1 - Tdap) Never done   Pneumonia Vaccine 37+ Years old (2 of 2 - PPSV23 or PCV20) 12/15/2019   COVID-19 Vaccine (5 - 2023-24 season) 05/07/2022   HEMOGLOBIN A1C  09/24/2022   Diabetic kidney evaluation - eGFR measurement  03/25/2023    Colorectal cancer screening: No longer required.   Mammogram status: No longer required due to age.  Bone Density status: Completed 09/06/18. Results reflect: Bone density results: NORMAL. Repeat every 5 years.  Lung Cancer Screening: (Low Dose CT Chest recommended if Age 62-80 years, 20 pack-year currently smoking OR have quit w/in 15years.) does not qualify.    Additional Screening:  Hepatitis C Screening: does qualify; Completed 02/10/21  Vision Screening: Recommended annual ophthalmology exams for early detection of glaucoma  and other disorders of the eye. Is the patient up to date with their annual eye exam?  No  Who is the provider or what is the name of the office in which the patient attends annual eye exams? No one If pt is not established with a provider, would they like to be referred to a provider to establish care? No .   Dental Screening: Recommended annual dental exams for proper oral hygiene  Diabetic Foot Exam: Diabetic Foot Exam: Overdue, Pt has been advised about the importance in completing this exam. Pt is scheduled for diabetic foot exam on no .  Community Resource Referral / Chronic Care Management: CRR required this visit?  No   CCM required this visit?  No     Plan:     I have personally reviewed and noted the following in the patient's chart:   Medical and social history Use of alcohol, tobacco or  illicit drugs  Current medications and supplements including opioid prescriptions. Patient is not currently taking opioid prescriptions. Functional ability and status Nutritional status Physical activity Advanced directives List of other physicians Hospitalizations, surgeries, and ER visits in previous 12 months Vitals Screenings to include cognitive, depression, and falls Referrals and appointments  In addition, I have reviewed and discussed with patient certain preventive protocols, quality metrics, and best practice recommendations. A written personalized care plan for preventive services as well as general preventive health recommendations were provided to patient.     Hal Hope, LPN   02/24/3085   After Visit Summary: (MyChart) Due to this being a telephonic visit, the after visit summary with patients personalized plan was offered to patient via MyChart   Nurse Notes: none

## 2023-04-28 ENCOUNTER — Other Ambulatory Visit: Payer: Self-pay | Admitting: Family Medicine

## 2023-04-28 DIAGNOSIS — Z792 Long term (current) use of antibiotics: Secondary | ICD-10-CM

## 2023-04-29 ENCOUNTER — Ambulatory Visit: Payer: Self-pay | Admitting: *Deleted

## 2023-04-29 DIAGNOSIS — Z792 Long term (current) use of antibiotics: Secondary | ICD-10-CM

## 2023-04-29 NOTE — Telephone Encounter (Signed)
    Chief Complaint: Pt. Calling for antibiotic, she is having dental work 05/04/23. Has a knee replacement. Symptoms: n/a Frequency: n/a Pertinent Negatives: Patient denies  Disposition: [] ED /[] Urgent Care (no appt availability in office) / [] Appointment(In office/virtual)/ []  [place] [facility]/ [] Home Care/ [] Refused Recommended Disposition /[] [place] [facility]/ [x]  Follow-up with PCP Additional Notes: Please advise pt.  Reason for Disposition  Prescription request for new medicine (not a refill)  Answer Assessment - Initial Assessment Questions 1. NAME of MEDICINE: "What medicine(s) are you calling about?"     Antibiotic 2. QUESTION: "What is your question?" (e.g., double dose of medicine, side effect)     I need an antibiotic for dental work 3. PRESCRIBER: "Who prescribed the medicine?" Reason: if prescribed by specialist, call should be referred to that group.     N/a 4. SYMPTOMS: "Do you have any symptoms?" If Yes, ask: "What symptoms are you having?"  "How bad are the symptoms (e.g., mild, moderate, severe)     No 5. PREGNANCY:  "Is there any chance that you are pregnant?" "When was your last menstrual period?"     No  Protocols used: Medication Question Call-A-AH

## 2023-04-29 NOTE — Telephone Encounter (Signed)
Unable to refill per protocol, Rx expired. Discontinued 03/31/22.  Requested Prescriptions  Pending Prescriptions Disp Refills   amoxicillin (AMOXIL) 500 MG capsule [Pharmacy Med Name: AMOXICILLIN 500 MG CAPSULE] 4 capsule 0    Sig: TAKE 2 CAPSULES (1000MG ) DOSE TWICE A DAY FOR 1 DAY PRIOR TO DENTAL WORK.     Off-Protocol Failed - 04/28/2023  8:08 PM      Failed - Medication not assigned to a protocol, review manually.      Passed - Valid encounter within last 12 months    Recent Outpatient Visits           1 year ago Hearing loss of left ear, unspecified hearing loss type   [place] [facility] [place], Netta Neat, DO   1 year ago Morbid obesity with BMI of 50.0-59.9, adult [facility])   [place] [facility] Smitty Cords, DO   2 years ago Tinea corporis   [place] [facility] Smitty Cords, DO   2 years ago Degenerative lumbar spinal stenosis    [facility] Smitty Cords, DO   2 years ago Hearing loss of left ear, unspecified hearing loss type   [facility] [place], Netta Neat, [place]

## 2023-04-29 NOTE — Telephone Encounter (Signed)
Summary: rx req / dental concern   The patient has called to request a prescription for an antibiotic for their upcoming dental appointment on 05/04/23  The patient has had a knee replacement and been told that they are prone to infection  Please contact further when possible        Called patient [phone] to review medication request for antibiotics for dental appt. No answer, LVMTCB [phone].

## 2023-05-02 MED ORDER — AMOXICILLIN 500 MG PO CAPS: 1000.0000 mg | ORAL_CAPSULE | Freq: Two times a day (BID) | ORAL | 0 refills | Status: DC

## 2023-05-02 NOTE — Telephone Encounter (Signed)
Please notify [facility]  Amoxicillin 500mg   TAKE 2 CAPSULES (1000MG ) DOSE TWICE A DAY FOR 1 DAY PRIOR TO DENTAL WORK      Saralyn Pilar, DO [facility] 05/02/2023, 12:05 PM

## 2023-05-02 NOTE — Telephone Encounter (Signed)
Pt advised.   Thanks,   -Laura  

## 2023-05-06 DIAGNOSIS — G4733 Obstructive sleep apnea (adult) (pediatric): Secondary | ICD-10-CM | POA: Diagnosis not present

## 2023-05-26 ENCOUNTER — Other Ambulatory Visit: Payer: Self-pay | Admitting: Family Medicine

## 2023-05-26 DIAGNOSIS — I10 Essential (primary) hypertension: Secondary | ICD-10-CM

## 2023-05-27 NOTE — Telephone Encounter (Signed)
Called pt  - left message on machine to return call and schedule appt.

## 2023-05-27 NOTE — Telephone Encounter (Signed)
Requested medications are due for refill today.  yes  Requested medications are on the active medications list.  yes  Last refill. 02/28/2023 #45 0 rf  Future visit scheduled.   no  Notes to clinic.  Pt last seen 03/31/2022. Called pt lmom to call for appt. Please review dosage.    Requested Prescriptions  Pending Prescriptions Disp Refills   metoprolol succinate (TOPROL-XL) 25 MG 24 hr tablet [Pharmacy Med Name: METOPROLOL SUCC ER 25 MG TAB] 90 tablet 3    Sig: TAKE 1 TABLET (25 MG TOTAL) BY MOUTH DAILY.     Cardiovascular:  Beta Blockers Passed - 05/26/2023  1:31 AM      Passed - Last BP in normal range    BP Readings from Last 1 Encounters:  12/23/22 122/76         Passed - Last Heart Rate in normal range    Pulse Readings from Last 1 Encounters:  12/23/22 73         Passed - Valid encounter within last 6 months    Recent Outpatient Visits           1 year ago Hearing loss of left ear, unspecified hearing loss type   [place] [facility] [place], Netta Neat, DO   1 year ago Morbid obesity with BMI of 50.0-59.9, adult [facility])   [employer] [facility] [place] Smitty Cords, DO   2 years ago Tinea corporis   [place] [facility] Smitty Cords, DO   2 years ago Degenerative lumbar spinal stenosis   [place] [facility] [place], Netta Neat, DO   2 years ago Hearing loss of left ear, unspecified hearing loss type   [facility] [place], Netta Neat, [place]

## 2023-06-10 ENCOUNTER — Encounter: Payer: Self-pay | Admitting: Family Medicine

## 2023-06-10 ENCOUNTER — Ambulatory Visit (INDEPENDENT_AMBULATORY_CARE_PROVIDER_SITE_OTHER): Payer: Medicare HMO | Admitting: Family Medicine

## 2023-06-10 VITALS — BP 138/84 | HR 82 | Ht 63.0 in | Wt 277.0 lb

## 2023-06-10 DIAGNOSIS — L308 Other specified dermatitis: Secondary | ICD-10-CM | POA: Diagnosis not present

## 2023-06-10 DIAGNOSIS — G4733 Obstructive sleep apnea (adult) (pediatric): Secondary | ICD-10-CM

## 2023-06-10 DIAGNOSIS — F411 Generalized anxiety disorder: Secondary | ICD-10-CM

## 2023-06-10 DIAGNOSIS — E782 Mixed hyperlipidemia: Secondary | ICD-10-CM | POA: Diagnosis not present

## 2023-06-10 DIAGNOSIS — Z6841 Body Mass Index (BMI) 40.0 and over, adult: Secondary | ICD-10-CM | POA: Diagnosis not present

## 2023-06-10 DIAGNOSIS — Z85828 Personal history of other malignant neoplasm of skin: Secondary | ICD-10-CM

## 2023-06-10 DIAGNOSIS — K219 Gastro-esophageal reflux disease without esophagitis: Secondary | ICD-10-CM | POA: Diagnosis not present

## 2023-06-10 DIAGNOSIS — E1169 Type 2 diabetes mellitus with other specified complication: Secondary | ICD-10-CM

## 2023-06-10 DIAGNOSIS — H04202 Unspecified epiphora, left lacrimal gland: Secondary | ICD-10-CM | POA: Diagnosis not present

## 2023-06-10 DIAGNOSIS — I10 Essential (primary) hypertension: Secondary | ICD-10-CM | POA: Diagnosis not present

## 2023-06-10 MED ORDER — TRIAMCINOLONE ACETONIDE 0.5 % EX CREA
1.0000 | TOPICAL_CREAM | Freq: Two times a day (BID) | CUTANEOUS | 2 refills | Status: AC
Start: 2023-06-10 — End: ?

## 2023-06-10 MED ORDER — METFORMIN HCL 500 MG PO TABS
500.0000 mg | ORAL_TABLET | Freq: Two times a day (BID) | ORAL | 3 refills | Status: DC
Start: 2023-06-10 — End: 2024-07-24

## 2023-06-10 MED ORDER — ATORVASTATIN CALCIUM 10 MG PO TABS: 10.0000 mg | ORAL_TABLET | Freq: Every day | ORAL | 3 refills | Status: DC

## 2023-06-10 MED ORDER — ESCITALOPRAM OXALATE 20 MG PO TABS
20.0000 mg | ORAL_TABLET | Freq: Every day | ORAL | 3 refills | Status: DC
Start: 2023-06-10 — End: 2024-04-27

## 2023-06-10 MED ORDER — OMEPRAZOLE 40 MG PO CPDR
40.0000 mg | DELAYED_RELEASE_CAPSULE | Freq: Every day | ORAL | 3 refills | Status: DC
Start: 2023-06-10 — End: 2024-04-27

## 2023-06-10 MED ORDER — RYBELSUS 3 MG PO TABS
3.0000 mg | ORAL_TABLET | Freq: Every day | ORAL | Status: DC
Start: 2023-06-10 — End: 2023-12-28

## 2023-06-10 MED ORDER — METOPROLOL SUCCINATE ER 25 MG PO TB24
12.5000 mg | ORAL_TABLET | Freq: Every day | ORAL | 3 refills | Status: DC
Start: 2023-06-10 — End: 2024-07-28

## 2023-06-10 NOTE — Patient Instructions (Addendum)
Thank you for coming to the office today.  Prevnar 20 and COVID at pharmacy  Labs next week.  Refills sent.  Check with your insurance on coverage  Mounjaro injection Ozempic injection Rybelsus oral - 3 mg 30 day trial   We can adjust dose to 7mg  in future. If it goes well  Take on empty stomach before any food or other meds.   Please schedule a Follow-up Appointment to: Return for lab 10/9.  If you have any other questions or concerns, please feel free to call the office or send a message through MyChart. You may also schedule an earlier appointment if necessary.  Additionally, you may be receiving a survey about your experience at our office within a few days to 1 week by e-mail or mail. We value your feedback.  Saralyn Pilar, DO [facility], [place]

## 2023-06-10 NOTE — Assessment & Plan Note (Signed)
Continue Statin therapy Check lipids

## 2023-06-10 NOTE — Assessment & Plan Note (Signed)
A1c previously >7 Complications OSA Hyperlipidemia Morbid obesity   Plan:  ON metformin 500mg  TWICE A DAY Add new rx Rybelsus oral 3mg  trial 30 day, notify office we can order 7mg  dose  Encourage improved lifestyle - low carb, low sugar diet, reduce portion size, continue improving regular exercise

## 2023-06-10 NOTE — Progress Notes (Signed)
Subjective:    Patient ID: Natasha Curry, female    DOB: May 04, 1946, 77 y.o.   MRN: [id number]  Natasha Curry is a 77 y.o. female presenting on 06/10/2023 for Medication Management   HPI  Discussed the use of AI scribe software for clinical note transcription with the patient, who gave verbal consent to proceed.     Morbid Obesity BMI >49 284 lbs > down to 277 lbs. Concern with portion control and sedentary   Chronic Back Pain Improved on muscle relaxants will need re order, takes Robaxin during day and Flexeril at night due to sedation. Tolerating well.  OSA, on CPAP - Patient reports prior history of dx OSA and on CPAP - Today reports that sleep apnea is well controlled. She uses the CPAP machine every night. Tolerates the machine well, and thinks that sleeps better with it and feels good. No new concerns or symptoms.  CHRONIC DM, Type 2: Due for A1c. Prior range >7 Not regularly checking CBG Meds: Metformin 500mg  TWICE A DAY Interested in other medication Reports good compliance. Tolerating well w/o side-effects Denies hypoglycemia, polyuria, visual changes, numbness or tingling.  History of Eyelid Basal Cell Carcinoma, surgically treated Now has increased tears in this eye and irritation Requesting Referral [facility]   The patient also has a history of eczema in their ears, for which they have been using Triamcinolone cream. They report that the cream has been effective, but they are running low Triamcinolone      06/10/2023    2:28 PM 04/01/2023   10:00 AM 03/31/2022    9:53 AM  Depression screen PHQ 2/9  Decreased Interest 0 0 0  Down, Depressed, Hopeless 0 0 0  PHQ - 2 Score 0 0 0  Altered sleeping 0 0 1  Tired, decreased energy 2 0 2  Change in appetite 0 0 3  Feeling bad or failure about yourself  0 0 1  Trouble concentrating 0 0 0  Moving slowly or fidgety/restless 0 0 0  Suicidal thoughts 0 0 0  PHQ-9 Score 2 0 7  Difficult doing work/chores  Not difficult at all Not difficult at all Not difficult at all    Social History   Tobacco Use   Smoking status: Never   Smokeless tobacco: Never  Vaping Use   Vaping status: Never Used  Substance Use Topics   Alcohol use: Yes    Alcohol/week: 3.0 standard drinks of alcohol    Types: 3 Standard drinks or equivalent per week   Drug use: Never    Review of Systems Per HPI unless specifically indicated above     Objective:    BP 138/84 (BP Location: Left Arm, Cuff Size: Large)   Pulse 82   Ht 5\' 3"  (1.6 m)   Wt 277 lb (125.6 kg)   SpO2 92%   BMI 49.07 kg/m   Wt Readings from Last 3 Encounters:  06/10/23 277 lb (125.6 kg)  04/01/23 284 lb (128.8 kg)  12/23/22 284 lb (128.8 kg)    Physical Exam Vitals and nursing note reviewed.  Constitutional:      General: She is not in acute distress.    Appearance: She is well-developed. She is not diaphoretic.     Comments: Well-appearing, comfortable, cooperative  HENT:     Head: Normocephalic and atraumatic.  Eyes:     General:        Right eye: No discharge.        Left eye:  No discharge.     Conjunctiva/sclera: Conjunctivae normal.  Neck:     Thyroid: No thyromegaly.  Cardiovascular:     Rate and Rhythm: Normal rate and regular rhythm.     Heart sounds: Normal heart sounds. No murmur heard. Pulmonary:     Effort: Pulmonary effort is normal. No respiratory distress.     Breath sounds: Normal breath sounds. No wheezing or rales.  Musculoskeletal:        General: Normal range of motion.     Cervical back: Normal range of motion and neck supple.  Lymphadenopathy:     Cervical: No cervical adenopathy.  Skin:    General: Skin is warm and dry.     Findings: No erythema or rash.  Neurological:     Mental Status: She is alert and oriented to person, place, and time.  Psychiatric:        Behavior: Behavior normal.     Comments: Well groomed, good eye contact, normal speech and thoughts     Diabetic Foot Exam - Simple    Simple Foot Form Diabetic Foot exam was performed with the following findings: Yes 06/10/2023  2:55 PM  Visual Inspection No deformities, no ulcerations, no other skin breakdown bilaterally: Yes Sensation Testing Intact to touch and monofilament testing bilaterally: Yes Pulse Check Posterior Tibialis and Dorsalis pulse intact bilaterally: Yes Comments      Results for orders placed or performed in visit on 03/24/22  TSH  Result Value Ref Range   TSH 2.19 0.40 - 4.50 mIU/L  Hemoglobin A1c  Result Value Ref Range   Hgb A1c MFr Bld 7.7 (H) <5.7 % of total Hgb   Mean Plasma Glucose 174 mg/dL   eAG (mmol/L) 9.7 mmol/L  Lipid panel  Result Value Ref Range   Cholesterol 164 <200 mg/dL   HDL 33 (L) > OR = 50 mg/dL   Triglycerides 161 (H) <150 mg/dL   LDL Cholesterol (Calc) 101 (H) mg/dL (calc)   Total CHOL/HDL Ratio 5.0 (H) <5.0 (calc)   Non-HDL Cholesterol (Calc) 131 (H) <130 mg/dL (calc)  CBC with Differential/Platelet  Result Value Ref Range   WBC 6.4 3.8 - 10.8 Thousand/uL   RBC 4.26 3.80 - 5.10 Million/uL   Hemoglobin 12.5 11.7 - 15.5 g/dL   HCT 09.6 04.5 - 40.9 %   MCV 91.3 80.0 - 100.0 fL   MCH 29.3 27.0 - 33.0 pg   MCHC 32.1 32.0 - 36.0 g/dL   RDW 81.1 91.4 - 78.2 %   Platelets 256 140 - 400 Thousand/uL   MPV 10.5 7.5 - 12.5 fL   Neutro Abs 2,675 1,500 - 7,800 cells/uL   Lymphs Abs 3,021 850 - 3,900 cells/uL   Absolute Monocytes 544 200 - 950 cells/uL   Eosinophils Absolute 102 15 - 500 cells/uL   Basophils Absolute 58 0 - 200 cells/uL   Neutrophils Relative % 41.8 %   Total Lymphocyte 47.2 %   Monocytes Relative 8.5 %   Eosinophils Relative 1.6 %   Basophils Relative 0.9 %  COMPLETE METABOLIC PANEL WITH GFR  Result Value Ref Range   Glucose, Bld 167 (H) 65 - 99 mg/dL   BUN 15 7 - 25 mg/dL   Creat 9.56 2.13 - 0.86 mg/dL   eGFR 68 > OR = 60 VH/QIO/9.62X5   BUN/Creatinine Ratio NOT APPLICABLE 6 - 22 (calc)   Sodium 141 135 - 146 mmol/L   Potassium 4.4 3.5 -  5.3 mmol/L   Chloride 105 98 -  110 mmol/L   CO2 26 20 - 32 mmol/L   Calcium 8.9 8.6 - 10.4 mg/dL   Total Protein 6.0 (L) 6.1 - 8.1 g/dL   Albumin 3.7 3.6 - 5.1 g/dL   Globulin 2.3 1.9 - 3.7 g/dL (calc)   AG Ratio 1.6 1.0 - 2.5 (calc)   Total Bilirubin 0.5 0.2 - 1.2 mg/dL   Alkaline phosphatase (APISO) 144 37 - 153 U/L   AST 10 10 - 35 U/L   ALT 10 6 - 29 U/L      Assessment & Plan:   Problem List Items Addressed This Visit     Essential hypertension    Elevated BP. Needs to restart meds - Home BP readings   No known complications     Plan:  1. Continue current BP regimen Metoprolol XL 12.5mg  daily (half of 25) 2. Encourage improved lifestyle - low sodium diet, regular exercise 3. Continue monitor BP outside office, bring readings to next visit, if persistently >140/90 or new symptoms notify office sooner      Relevant Medications   atorvastatin (LIPITOR) 10 MG tablet   metoprolol succinate (TOPROL-XL) 25 MG 24 hr tablet   Other Relevant Orders   COMPLETE METABOLIC PANEL WITH GFR   CBC with Differential/Platelet   TSH   GAD (generalized anxiety disorder)   Relevant Medications   escitalopram (LEXAPRO) 20 MG tablet   Mixed hyperlipidemia    Continue Statin therapy Check lipids      Relevant Medications   atorvastatin (LIPITOR) 10 MG tablet   metoprolol succinate (TOPROL-XL) 25 MG 24 hr tablet   Other Relevant Orders   Lipid panel   COMPLETE METABOLIC PANEL WITH GFR   TSH   Morbid obesity with BMI of 45.0-49.9, adult (HCC)    Improved wt loss Goal to improve diet focus Trial on GLP1 oral      Relevant Medications   metFORMIN (GLUCOPHAGE) 500 MG tablet   RYBELSUS 3 MG TABS   Other Relevant Orders   Lipid panel   COMPLETE METABOLIC PANEL WITH GFR   CBC with Differential/Platelet   OSA on CPAP    Well controlled, chronic OSA on CPAP - Good adherence to CPAP nightly - Continue current CPAP therapy, patient seems to be benefiting from therapy       Type  2 diabetes mellitus with other specified complication (HCC) - Primary    A1c previously >7 Complications OSA Hyperlipidemia Morbid obesity   Plan:  ON metformin 500mg  TWICE A DAY Add new rx Rybelsus oral 3mg  trial 30 day, notify office we can order 7mg  dose  Encourage improved lifestyle - low carb, low sugar diet, reduce portion size, continue improving regular exercise      Relevant Medications   atorvastatin (LIPITOR) 10 MG tablet   metFORMIN (GLUCOPHAGE) 500 MG tablet   RYBELSUS 3 MG TABS   Other Relevant Orders   Hemoglobin A1c   Urine Microalbumin w/creat. ratio   Other Visit Diagnoses     Eye tearing, left       Relevant Orders   Ambulatory referral to Ophthalmology   Other eczema       Relevant Medications   triamcinolone cream (KENALOG) 0.5 %   Gastroesophageal reflux disease without esophagitis       Relevant Medications   omeprazole (PRILOSEC) 40 MG capsule   History of basal cell carcinoma (BCC) of eyelid       Relevant Orders   Ambulatory referral to Ophthalmology  Assessment and Plan    Eye Concern Patient reports increased tearing in the eye where basal cell carcinoma was removed 5-7 years ago. -Refer to ophthalmology for evaluation.  Eczema Patient reports benefit from Triamcinolone cream for eczema in the ears. -Refill Triamcinolone cream prescription.  General Health Maintenance -Advise patient to get flu shot and upgraded pneumonia shot at the pharmacy. Labs next week     Orders Placed This Encounter  Procedures   Hemoglobin A1c    Standing Status:   Future    Standing Expiration Date:   06/09/2024   Lipid panel    Standing Status:   Future    Standing Expiration Date:   06/09/2024    Order Specific Question:   Has the patient fasted?    Answer:   Yes   COMPLETE METABOLIC PANEL WITH GFR    Standing Status:   Future    Standing Expiration Date:   06/09/2024   CBC with Differential/Platelet    Standing Status:   Future    Standing  Expiration Date:   06/09/2024   TSH    Standing Status:   Future    Standing Expiration Date:   06/09/2024   Urine Microalbumin w/creat. ratio    Standing Status:   Future    Standing Expiration Date:   06/09/2024   Ambulatory referral to Ophthalmology    Referral Priority:   Routine    Referral Type:   Consultation    Referral Reason:   Specialty Services Required    Requested Specialty:   Ophthalmology    Number of Visits Requested:   1     Meds ordered this encounter  Medications   atorvastatin (LIPITOR) 10 MG tablet    Sig: Take 1 tablet (10 mg total) by mouth daily.    Dispense:  90 tablet    Refill:  3   escitalopram (LEXAPRO) 20 MG tablet    Sig: Take 1 tablet (20 mg total) by mouth daily.    Dispense:  90 tablet    Refill:  3    F41.1   metoprolol succinate (TOPROL-XL) 25 MG 24 hr tablet    Sig: Take 0.5 tablets (12.5 mg total) by mouth daily.    Dispense:  45 tablet    Refill:  3   triamcinolone cream (KENALOG) 0.5 %    Sig: Apply 1 Application topically 2 (two) times daily. To affected areas, for up to 2 weeks.    Dispense:  30 g    Refill:  2   metFORMIN (GLUCOPHAGE) 500 MG tablet    Sig: Take 1 tablet (500 mg total) by mouth 2 (two) times daily with a meal.    Dispense:  180 tablet    Refill:  3   omeprazole (PRILOSEC) 40 MG capsule    Sig: Take 1 capsule (40 mg total) by mouth daily.    Dispense:  90 capsule    Refill:  3   RYBELSUS 3 MG TABS    Sig: Take 1 tablet (3 mg total) by mouth daily.      Follow up plan: Return for lab 10/9.   Full lab panel next week 10/9 add Urine microalbmin   Saralyn Pilar, DO [facility] 06/10/2023, 2:41 PM

## 2023-06-10 NOTE — Assessment & Plan Note (Signed)
Improved wt loss Goal to improve diet focus Trial on GLP1 oral

## 2023-06-10 NOTE — Assessment & Plan Note (Signed)
Well controlled, chronic OSA on CPAP - Good adherence to CPAP nightly - Continue current CPAP therapy, patient seems to be benefiting from therapy  

## 2023-06-10 NOTE — Assessment & Plan Note (Signed)
Elevated BP. Needs to restart meds - Home BP readings   No known complications     Plan:  1. Continue current BP regimen Metoprolol XL 12.5mg  daily (half of 25) 2. Encourage improved lifestyle - low sodium diet, regular exercise 3. Continue monitor BP outside office, bring readings to next visit, if persistently >140/90 or new symptoms notify office sooner

## 2023-06-15 ENCOUNTER — Other Ambulatory Visit: Payer: Medicare HMO

## 2023-06-15 DIAGNOSIS — E1169 Type 2 diabetes mellitus with other specified complication: Secondary | ICD-10-CM

## 2023-06-15 DIAGNOSIS — E782 Mixed hyperlipidemia: Secondary | ICD-10-CM

## 2023-06-15 DIAGNOSIS — Z6841 Body Mass Index (BMI) 40.0 and over, adult: Secondary | ICD-10-CM | POA: Diagnosis not present

## 2023-06-15 DIAGNOSIS — I10 Essential (primary) hypertension: Secondary | ICD-10-CM | POA: Diagnosis not present

## 2023-06-16 LAB — COMPLETE METABOLIC PANEL WITH GFR
AG Ratio: 1.4 (calc) (ref 1.0–2.5)
ALT: 7 U/L (ref 6–29)
AST: 10 U/L (ref 10–35)
Albumin: 3.5 g/dL — ABNORMAL LOW (ref 3.6–5.1)
Alkaline phosphatase (APISO): 125 U/L (ref 37–153)
BUN: 12 mg/dL (ref 7–25)
CO2: 28 mmol/L (ref 20–32)
Calcium: 8.8 mg/dL (ref 8.6–10.4)
Chloride: 105 mmol/L (ref 98–110)
Creat: 0.85 mg/dL (ref 0.60–1.00)
Globulin: 2.5 g/dL (ref 1.9–3.7)
Glucose, Bld: 156 mg/dL — ABNORMAL HIGH (ref 65–99)
Potassium: 4.4 mmol/L (ref 3.5–5.3)
Sodium: 141 mmol/L (ref 135–146)
Total Bilirubin: 0.6 mg/dL (ref 0.2–1.2)
Total Protein: 6 g/dL — ABNORMAL LOW (ref 6.1–8.1)
eGFR: 70 mL/min/{1.73_m2} (ref >=60)

## 2023-06-16 LAB — TSH: TSH: 2.88 m[IU]/L (ref 0.40–4.50)

## 2023-06-16 LAB — LIPID PANEL
Cholesterol: 144 mg/dL (ref <200)
HDL: 31 mg/dL — ABNORMAL LOW (ref >=50)
LDL Cholesterol (Calc): 86 mg/dL
Non-HDL Cholesterol (Calc): 113 mg/dL (ref <130)
Total CHOL/HDL Ratio: 4.6 (calc) (ref <5.0)
Triglycerides: 175 mg/dL — ABNORMAL HIGH (ref <150)

## 2023-06-16 LAB — CBC WITH DIFFERENTIAL/PLATELET
Absolute Monocytes: 525 {cells}/uL (ref 200–950)
Basophils Absolute: 30 {cells}/uL (ref 0–200)
Basophils Relative: 0.5 %
Eosinophils Absolute: 112 {cells}/uL (ref 15–500)
Eosinophils Relative: 1.9 %
HCT: 36.7 % (ref 35.0–45.0)
Hemoglobin: 11.5 g/dL — ABNORMAL LOW (ref 11.7–15.5)
Lymphs Abs: 2454 {cells}/uL (ref 850–3900)
MCH: 28.5 pg (ref 27.0–33.0)
MCHC: 31.3 g/dL — ABNORMAL LOW (ref 32.0–36.0)
MCV: 91.1 fL (ref 80.0–100.0)
MPV: 11 fL (ref 7.5–12.5)
Monocytes Relative: 8.9 %
Neutro Abs: 2779 {cells}/uL (ref 1500–7800)
Neutrophils Relative %: 47.1 %
Platelets: 237 10*3/uL (ref 140–400)
RBC: 4.03 10*6/uL (ref 3.80–5.10)
RDW: 13.2 % (ref 11.0–15.0)
Total Lymphocyte: 41.6 %
WBC: 5.9 10*3/uL (ref 3.8–10.8)

## 2023-06-16 LAB — MICROALBUMIN / CREATININE URINE RATIO
Creatinine, Urine: 151 mg/dL (ref 20–275)
Microalb Creat Ratio: 10 mg/g{creat} (ref <30)
Microalb, Ur: 1.5 mg/dL

## 2023-06-16 LAB — HEMOGLOBIN A1C
Hgb A1c MFr Bld: 8.5 %{Hb} — ABNORMAL HIGH (ref <5.7)
Mean Plasma Glucose: 197 mg/dL
eAG (mmol/L): 10.9 mmol/L

## 2023-06-17 ENCOUNTER — Ambulatory Visit: Payer: Self-pay | Admitting: *Deleted

## 2023-06-17 NOTE — Telephone Encounter (Signed)
Reason for Disposition  [1] Follow-up call to recent contact AND [2] information only call, no triage required  Answer Assessment - Initial Assessment Questions 1. REASON FOR CALL or QUESTION: "What is your reason for calling today?" or "How can I best help you?" or "What question do you have that I can help answer?"     Pt given lab results per notes of Dr. Althea Charon on 06/16/2023 at 11:47 AM  . Pt verbalized understanding.  Protocols used: Information Only Call - No Triage-A-AH

## 2023-06-17 NOTE — Telephone Encounter (Signed)
  Chief Complaint: Called in and received lab result message from Dr. Althea Charon Symptoms: N/A Frequency: N/A Pertinent Negatives: Patient denies N/A Disposition: [] ED /[] Urgent Care (no appt availability in office) / [] Appointment(In office/virtual)/ []  [facility]/ [x] Home Care/ [] Refused Recommended Disposition /[] [facility]/ []  Follow-up with PCP Additional Notes:

## 2023-07-07 ENCOUNTER — Telehealth: Payer: Self-pay

## 2023-07-07 DIAGNOSIS — N200 Calculus of kidney: Secondary | ICD-10-CM

## 2023-07-07 DIAGNOSIS — Z87442 Personal history of urinary calculi: Secondary | ICD-10-CM

## 2023-07-07 NOTE — Telephone Encounter (Signed)
Referral signed to [facility] for recurrent kidney stones.  Saralyn Pilar, DO [facility] 07/07/2023, 2:51 PM

## 2023-07-07 NOTE — Telephone Encounter (Signed)
Copied from CRM [id number]. Topic: Referral - Request for Referral >> Jul 07, 2023  1:41 PM Haroldine Laws wrote: Reason for CRM: pt called asking for a referral to urology for kidney stones. She said she was just in there a week ago  CB@  [phone]

## 2023-07-26 DIAGNOSIS — L400 Psoriasis vulgaris: Secondary | ICD-10-CM | POA: Diagnosis not present

## 2023-07-26 DIAGNOSIS — L408 Other psoriasis: Secondary | ICD-10-CM | POA: Diagnosis not present

## 2023-08-10 DIAGNOSIS — G4733 Obstructive sleep apnea (adult) (pediatric): Secondary | ICD-10-CM | POA: Diagnosis not present

## 2023-09-22 ENCOUNTER — Telehealth: Payer: Self-pay

## 2023-09-22 DIAGNOSIS — G4733 Obstructive sleep apnea (adult) (pediatric): Secondary | ICD-10-CM | POA: Diagnosis not present

## 2023-09-22 NOTE — Telephone Encounter (Signed)
Copied from CRM [id number]. Topic: Referral - Request for Referral >> Sep 22, 2023 10:53 AM Patsy Lager T wrote: Did the patient discuss referral with their provider in the last year? Yes (If No - schedule appointment) (If Yes - send message)  Appointment offered? No  Type of order/referral and detailed reason for visit: Pulmonologist; sleep apnea  Preference of office, provider, location: unknown  If referral order, have you been seen by this specialty before? Yes (If Yes, this issue or another issue? When? Where? Dr Erin Fulling on 12/23/2021  Can we respond through MyChart? No

## 2023-09-22 NOTE — Telephone Encounter (Signed)
Please call patient and ask her to call her Pulmonologist to schedule follow up apt and order Sleep Study as requested.  I see the referral request. However, patient has actually already seen Dr Belia Heman at [facility] on 12/23/22 for Sleep Apnea.  It has not been 1 full year yet. She should not require a new referral to return to Dr Clovis Fredrickson office.  They can order sleep study from their office and manage the sleep apnea further.  Saralyn Pilar, DO [facility] 09/22/2023, 11:58 AM

## 2023-09-22 NOTE — Telephone Encounter (Signed)
Spoke to patient she will call [place] to schedule a pulmonologist appointment

## 2023-09-28 ENCOUNTER — Other Ambulatory Visit: Payer: Self-pay | Admitting: Family Medicine

## 2023-09-28 DIAGNOSIS — G8929 Other chronic pain: Secondary | ICD-10-CM

## 2023-09-28 NOTE — Telephone Encounter (Signed)
Requested medication (s) are due for refill today: review  Requested medication (s) are on the active medication list: no  Last refill:  03/2022 #30/2  Future visit scheduled: no  Notes to clinic:  Unable to refill per protocol, cannot delegate. Discontinued by: Ashok Cordia, CMA on 06/10/2023 14:23      Requested Prescriptions  Pending Prescriptions Disp Refills   cyclobenzaprine (FLEXERIL) 10 MG tablet [Pharmacy Med Name: CYCLOBENZAPRINE 10 MG TABLET] 30 tablet 2    Sig: TAKE 1 TABLET BY MOUTH AT BEDTIME AS NEEDED FOR MUSCLE SPASMS     Not Delegated - Analgesics:  Muscle Relaxants Failed - 09/28/2023  5:19 PM      Failed - This refill cannot be delegated      Passed - Valid encounter within last 6 months    Recent Outpatient Visits           3 months ago Type 2 diabetes mellitus with other specified complication, without long-term current use of insulin [facility])   [place] [facility], Netta Neat, DO   1 year ago Hearing loss of left ear, unspecified hearing loss type   [place] [facility] Smitty Cords, DO   1 year ago Morbid obesity with BMI of 50.0-59.9, adult [facility])   [place] [facility] Smitty Cords, DO   2 years ago Tinea corporis   [place] [facility] Smitty Cords, DO   2 years ago Degenerative lumbar spinal stenosis   [place] [facility] Althea Charon, Netta Neat, DO       Future Appointments             In 2 weeks Erin Fulling, MD [facility]

## 2023-10-13 ENCOUNTER — Ambulatory Visit: Payer: Medicare HMO | Admitting: Internal Medicine

## 2023-10-13 ENCOUNTER — Encounter: Payer: Self-pay | Admitting: Internal Medicine

## 2023-10-13 VITALS — BP 128/88 | HR 74 | Temp 97.6°F | Ht 63.0 in | Wt 277.0 lb

## 2023-10-13 DIAGNOSIS — J4520 Mild intermittent asthma, uncomplicated: Secondary | ICD-10-CM

## 2023-10-13 DIAGNOSIS — G4733 Obstructive sleep apnea (adult) (pediatric): Secondary | ICD-10-CM | POA: Diagnosis not present

## 2023-10-13 MED ORDER — ALBUTEROL SULFATE HFA 108 (90 BASE) MCG/ACT IN AERS
2.0000 | INHALATION_SPRAY | RESPIRATORY_TRACT | 2 refills | Status: DC | PRN
Start: 2023-10-13 — End: 2024-05-02

## 2023-10-13 NOTE — Patient Instructions (Addendum)
 Continue CPAP as prescribed  Will place another order for new machine  Can use albuterol  as needed for cough and shortness of breath  Follow up Neurology for dementia  Avoid Allergens and Irritants Avoid secondhand smoke Avoid SICK contacts Recommend  Masking  when appropriate Recommend Keep up-to-date with vaccinations

## 2023-10-13 NOTE — Progress Notes (Signed)
 @Patient  ID: Natasha Curry, female    DOB: 12-18-1945, 78 y.o.   MRN: [id number]     CC  Follow-up OSA  HPI: 78 yo female seen for sleep consult 10/2019 to establish for for OSA (diagnosed around 2011)  History of morbid obesity s/p gastric bypass surgery , lost 100lbs but gained back.  Patient diagnosed with sleep apnea and on CPAP several visits in the past showed excellent compliance  She endorses using nasal mask. She believes CPAP pressure 6cm h20   10/13/2023 Follow-up assessment for OSA Doing well with CPAP Excellent compliance per patient  Order placed last year for yearly screening Unable to determine her DME company  She will need new assessment for sleep apnea and HST Patient has been having excessive daytime sleepiness for a long time Patient has been having extreme fatigue and tiredness, lack of energy +  very Loud snoring every night + struggling breathe at night and gasps for air She carries a diagnosis of sleep apnea and says that her face mask is breaking down We will need to reestablish her diagnosis with home sleep test Will refer to DME company for machine and supplies  Encouraged proper weight management.  Discussed driving precautions and its relationship with hypersomnolence.  Discussed operating dangerous equipment and its relationship with hypersomnolence.  Discussed sleep hygiene, and benefits of a fixed sleep waked time.  The importance of getting eight or more hours of sleep discussed with patient.  Discussed limiting the use of the computer and television before bedtime.  Decrease naps during the day, so night time sleep will become enhanced.  Limit caffeine, and sleep deprivation.  HTN, stroke, and heart failure are potential risk factors.   Discussed risk of untreated sleep apnea including cardiac arrhthymias, stroke, DM, pulm HTN.      Patient is morbidly obese at 277 pounds Patient has intermittent reactive airways disease asthma related  to her obesity Patient currently not on any inhalers Have explained to her lets start off with albuterol  inhaler as needed  Patient does have dementia Neurology referral placed at last visit    No evidence of heart failure at this time No evidence or signs of infection at this time No respiratory distress No fevers, chills, nausea, vomiting, diarrhea No evidence of lower extremity edema No evidence hemoptysis    No Known Allergies  Immunization History  Administered Date(s) Administered   Fluad Quad(high Dose 65+) 05/19/2022   Influenza, High Dose Seasonal PF 05/12/2019, 05/29/2020, 08/05/2021   PFIZER(Purple Top)SARS-COV-2 Vaccination 10/22/2019, 11/12/2019, 06/15/2020   Pfizer Covid-19 Vaccine Bivalent Booster 63yrs & up 08/16/2021   Pneumococcal Conjugate-13 12/15/2018   Zoster Recombinant(Shingrix) 12/15/2018, 02/14/2019    Past Medical History:  Diagnosis Date   Allergy    Anxiety    Cancer (HCC)    basil cell   Colon polyp    Depression    Eczema    GERD (gastroesophageal reflux disease)    Kidney stones    Psoriasis    Sleep apnea    Tremor     Tobacco History: Social History   Tobacco Use  Smoking Status Never  Smokeless Tobacco Never   Counseling given: Not Answered   Outpatient Medications Prior to Visit  Medication Sig Dispense Refill   atorvastatin  (LIPITOR) 10 MG tablet Take 1 tablet (10 mg total) by mouth daily. 90 tablet 3   cyclobenzaprine  (FLEXERIL ) 10 MG tablet TAKE 1 TABLET BY MOUTH AT BEDTIME AS NEEDED FOR MUSCLE SPASMS 30 tablet 2  desoximetasone (TOPICORT) 0.25 % cream Apply 1 Application topically 2 (two) times daily.     diclofenac  Sodium (VOLTAREN ) 1 % GEL Apply 2 g topically 4 (four) times daily as needed. 100 g 2   escitalopram  (LEXAPRO ) 20 MG tablet Take 1 tablet (20 mg total) by mouth daily. 90 tablet 3   lidocaine  (LIDODERM ) 5 % PLEASE SEE ATTACHED FOR DETAILED DIRECTIONS     metFORMIN  (GLUCOPHAGE ) 500 MG tablet Take 1  tablet (500 mg total) by mouth 2 (two) times daily with a meal. 180 tablet 3   metoprolol  succinate (TOPROL -XL) 25 MG 24 hr tablet Take 0.5 tablets (12.5 mg total) by mouth daily. 45 tablet 3   nystatin cream (MYCOSTATIN) APPLY A THIN LAYER TO AFFECTED AREA TWICE DAILY TO FOLDS     omeprazole  (PRILOSEC) 40 MG capsule Take 1 capsule (40 mg total) by mouth daily. 90 capsule 3   RYBELSUS  3 MG TABS Take 1 tablet (3 mg total) by mouth daily.     triamcinolone  cream (KENALOG ) 0.5 % Apply 1 Application topically 2 (two) times daily. To affected areas, for up to 2 weeks. 30 g 2   No facility-administered medications prior to visit.   BP 128/88 (BP Location: Left Arm, Patient Position: Sitting, Cuff Size: Normal)   Pulse 74   Temp 97.6 F (36.4 C) (Temporal)   Ht 5' 3 (1.6 m)   Wt 277 lb (125.6 kg)   SpO2 95%   BMI 49.07 kg/m   Review of Systems: Gen:  Denies  fever, sweats, chills weight loss  HEENT: Denies blurred vision, double vision, ear pain, eye pain, hearing loss, nose bleeds, sore throat Cardiac:  No dizziness, chest pain or heaviness, chest tightness,edema, No JVD Resp:   No cough, -sputum production, -shortness of breath,-wheezing, -hemoptysis,  Other:  All other systems negative   Physical Examination:   General Appearance: No distress  EYES PERRLA, EOM intact.   NECK Supple, No JVD Pulmonary: normal breath sounds, No wheezing.  CardiovascularNormal S1,S2.  No m/r/g.   Abdomen: Benign, Soft, non-tender. Neurology UE/LE 5/5 strength, no focal deficits Ext pulses intact, cap refill intact ALL OTHER ROS ARE NEGATIVE   Assessment & Plan:   78 year old pleasant white female patient seen today for follow-up assessment for sleep apnea in the setting of morbid obesity, now with signs and symptoms of intermittent reactive airways disease and asthma  Assessment of sleep apnea Continue CPAP as prescribed Excellent compliance report per patient New machine provided last office  visit Uses and benefits from therapy At this time we will need to reevaluate her sleep study we will obtain a home sleep test And then we will establish new company and new machine   Mild intermittent reactive airways disease pertaining to asthma Definitely related to her morbid obesity with a BMI of 49 Avoid Allergens and Irritants Avoid secondhand smoke Avoid SICK contacts Recommend  Masking  when appropriate Recommend Keep up-to-date with vaccinations Start albuterol  2 puffs every 4 hours as needed yes  Obesity -recommend significant weight loss -recommend changing diet  Deconditioned state -Recommend increased daily activity and exercise   Referral to Dr. Maree neurology to assess for dementia   MEDICATION ADJUSTMENTS/LABS AND TESTS ORDERED: Continue CPAP as prescribed Will place another order for new machine Place order for home sleep test to reestablish diagnosis Can use albuterol  as needed for cough and shortness of breath Follow up Neurology for dementia Avoid Allergens and Irritants Avoid secondhand smoke Avoid SICK contacts Recommend  Masking  when appropriate Recommend Keep up-to-date with vaccinations   CURRENT MEDICATIONS REVIEWED AT LENGTH WITH PATIENT TODAY   Patient  satisfied with Plan of action and management. All questions answered   Follow up 6 months   I spent a total of 48 minutes reviewing chart data, face-to-face evaluation with the patient, counseling and coordination of care as detailed above.      Nickolas Alm Cellar, M.D.  [facility]  Medical Director [facility] Medical Director [facility] Cardio-Pulmonary Department

## 2023-10-20 DIAGNOSIS — G4730 Sleep apnea, unspecified: Secondary | ICD-10-CM | POA: Diagnosis not present

## 2023-10-20 DIAGNOSIS — G4733 Obstructive sleep apnea (adult) (pediatric): Secondary | ICD-10-CM

## 2023-11-03 ENCOUNTER — Telehealth: Payer: Self-pay | Admitting: Pulmonary Disease

## 2023-11-03 DIAGNOSIS — G4733 Obstructive sleep apnea (adult) (pediatric): Secondary | ICD-10-CM

## 2023-11-03 NOTE — Telephone Encounter (Signed)
 Call patient  Sleep study result  Date of study: 10/20/2023  Impression: Moderate obstructive sleep apnea with moderate oxygen desaturations, AHI of 24.4  Recommendation: DME referral for CPAP therapy  May continue with current settings of CPAP or auto CPAP 5-20 with heated humidification with patient's mask of choice  Close clinical follow-up with compliance monitoring to optimize treatment

## 2023-11-04 NOTE — Telephone Encounter (Signed)
 Lmtcb

## 2023-11-07 NOTE — Telephone Encounter (Signed)
 CPAP Order has been placed.  Nothing further needed.

## 2023-11-07 NOTE — Telephone Encounter (Signed)
 Patient called back, would like order for CPAP placed and will call back when she gets her machine to schedule follow up

## 2023-11-07 NOTE — Telephone Encounter (Signed)
 Natasha Fulling, MD    Dx of OSA  Referral to DME company [employer] RESMED [id number] MASK Recommend Auto CPAP 4-14 cm h20

## 2023-11-22 DIAGNOSIS — G4733 Obstructive sleep apnea (adult) (pediatric): Secondary | ICD-10-CM | POA: Diagnosis not present

## 2023-12-14 DIAGNOSIS — Z1379 Encounter for other screening for genetic and chromosomal anomalies: Secondary | ICD-10-CM | POA: Diagnosis not present

## 2023-12-22 DIAGNOSIS — G4733 Obstructive sleep apnea (adult) (pediatric): Secondary | ICD-10-CM | POA: Diagnosis not present

## 2023-12-23 DIAGNOSIS — G4733 Obstructive sleep apnea (adult) (pediatric): Secondary | ICD-10-CM | POA: Diagnosis not present

## 2023-12-28 ENCOUNTER — Encounter: Payer: Self-pay | Admitting: Family Medicine

## 2023-12-28 ENCOUNTER — Ambulatory Visit (INDEPENDENT_AMBULATORY_CARE_PROVIDER_SITE_OTHER): Admitting: Family Medicine

## 2023-12-28 VITALS — BP 122/78 | HR 69 | Ht 63.0 in | Wt 278.2 lb

## 2023-12-28 DIAGNOSIS — R413 Other amnesia: Secondary | ICD-10-CM | POA: Diagnosis not present

## 2023-12-28 DIAGNOSIS — I10 Essential (primary) hypertension: Secondary | ICD-10-CM | POA: Diagnosis not present

## 2023-12-28 DIAGNOSIS — Z7985 Long-term (current) use of injectable non-insulin antidiabetic drugs: Secondary | ICD-10-CM | POA: Diagnosis not present

## 2023-12-28 DIAGNOSIS — H04202 Unspecified epiphora, left lacrimal gland: Secondary | ICD-10-CM

## 2023-12-28 DIAGNOSIS — E1169 Type 2 diabetes mellitus with other specified complication: Secondary | ICD-10-CM | POA: Diagnosis not present

## 2023-12-28 DIAGNOSIS — Z85828 Personal history of other malignant neoplasm of skin: Secondary | ICD-10-CM

## 2023-12-28 LAB — POCT GLYCOSYLATED HEMOGLOBIN (HGB A1C): Hemoglobin A1C: 7.8 % — AB (ref 4.0–5.6)

## 2023-12-28 MED ORDER — MOUNJARO 2.5 MG/0.5ML ~~LOC~~ SOAJ
2.5000 mg | SUBCUTANEOUS | Status: DC
Start: 2023-12-28 — End: 2024-02-13

## 2023-12-28 NOTE — Progress Notes (Signed)
 Subjective:    Patient ID: Natasha Curry, female    DOB: [birth year], 78 y.o.   MRN: [id number]  Natasha Curry is a 78 y.o. female presenting on 12/28/2023 for Medical Management of Chronic Issues (Memory concerns )   HPI   Discussed the use of AI scribe software for clinical note transcription with the patient, who gave verbal consent to proceed.  History of Present Illness   Natasha Curry is a 78 year old female with a history of basal cell carcinoma who presents with left eye discharge and blurry vision.  She experiences persistent discharge from her left eye, which becomes more pronounced at times along with some blurriness. She is concerned about the possibility of basal cell carcinoma recurrence, as she had a previous diagnosis and treatment for basal cell cancer on her lower left eyelid seven years ago.  She has a history of type 2 diabetes and is concerned about her blood sugar levels. Her recent HbA1c is 7.8, which is an improvement from a previous level of 8.5. She describes herself as an 'obsessive compulsive eater' and has stopped taking Rybelsus  due to stomach aches. She wants to try a new medication to help manage her appetite and blood sugar. Past failed Ozempic before. - Interested in other med options - Admits difficulty losing weight.  She mentions experiencing memory issues, describing it as 'brain fog' and forgetfulness, such as leaving the faucet running. She is concerned about the possibility of dementia, especially since her husband is experiencing similar issues. However, she scored 28 out of 30 on a recent memory test, indicating mild forgetfulness rather than dementia.          12/28/2023    1:27 PM 06/10/2023    2:28 PM 04/01/2023   10:00 AM  Depression screen PHQ 2/9  Decreased Interest 0 0 0  Down, Depressed, Hopeless 0 0 0  PHQ - 2 Score 0 0 0  Altered sleeping 1 0 0  Tired, decreased energy 1 2 0  Change in appetite 2 0 0  Feeling bad or failure about  yourself  0 0 0  Trouble concentrating 0 0 0  Moving slowly or fidgety/restless 0 0 0  Suicidal thoughts 0 0 0  PHQ-9 Score 4 2 0  Difficult doing work/chores Not difficult at all Not difficult at all Not difficult at all       12/28/2023    1:27 PM 06/10/2023    2:28 PM 03/31/2022    9:53 AM 12/23/2021    8:28 AM  GAD 7 : Generalized Anxiety Score  Nervous, Anxious, on Edge 1 0 0 0  Control/stop worrying 0 0 0 0  Worry too much - different things 0 0 0 0  Trouble relaxing 0 0 0 0  Restless 0 0 0 0  Easily annoyed or irritable 0 0 0 0  Afraid - awful might happen 0 0 0 0  Total GAD 7 Score 1 0 0 0  Anxiety Difficulty Not difficult at all  Not difficult at all Not difficult at all    Social History   Tobacco Use   Smoking status: Never   Smokeless tobacco: Never  Vaping Use   Vaping status: Never Used  Substance Use Topics   Alcohol use: Yes    Alcohol/week: 3.0 standard drinks of alcohol    Types: 3 Standard drinks or equivalent per week   Drug use: Never    Review of Systems Per HPI unless specifically indicated above  Objective:    BP 122/78 (BP Location: Right Arm, Patient Position: Sitting, Cuff Size: Large)   Pulse 69   Ht 5\' 3"  (1.6 m)   Wt 278 lb 3.2 oz (126.2 kg)   SpO2 99%   BMI 49.28 kg/m   Wt Readings from Last 3 Encounters:  12/28/23 278 lb 3.2 oz (126.2 kg)  10/13/23 277 lb (125.6 kg)  06/10/23 277 lb (125.6 kg)    Physical Exam Vitals and nursing note reviewed.  Constitutional:      General: She is not in acute distress.    Appearance: She is well-developed. She is obese. She is not diaphoretic.     Comments: Well-appearing, comfortable, cooperative  HENT:     Head: Normocephalic and atraumatic.  Eyes:     General:        Right eye: No discharge.        Left eye: No discharge.     Conjunctiva/sclera: Conjunctivae normal.     Comments: No obvious abnormality of Left lower eyelid  Neck:     Thyroid: No thyromegaly.  Cardiovascular:      Rate and Rhythm: Normal rate and regular rhythm.     Heart sounds: Normal heart sounds. No murmur heard. Pulmonary:     Effort: Pulmonary effort is normal. No respiratory distress.     Breath sounds: Normal breath sounds. No wheezing or rales.  Musculoskeletal:        General: Normal range of motion.     Cervical back: Normal range of motion and neck supple.  Lymphadenopathy:     Cervical: No cervical adenopathy.  Skin:    General: Skin is warm and dry.     Findings: No erythema or rash.  Neurological:     Mental Status: She is alert and oriented to person, place, and time.  Psychiatric:        Behavior: Behavior normal.     Comments: Well groomed, good eye contact, normal speech and thoughts     Recent Labs    06/15/23 0819 12/28/23 1324  HGBA1C 8.5* 7.8*      04/01/2023   10:04 AM 03/26/2022   11:01 AM 03/24/2021   11:12 AM  6CIT Screen  What Year? 0 points 0 points 0 points  What month? 0 points 0 points 0 points  What time? 0 points 0 points 3 points  Count back from 20 0 points 0 points 0 points  Months in reverse 0 points 0 points 2 points  Repeat phrase 0 points 0 points 2 points  Total Score 0 points 0 points 7 points       12/28/2023    1:18 PM  MMSE - Mini Mental State Exam  Orientation to time 5  Orientation to Place 5  Registration 3  Attention/ Calculation 5  Recall 2  Language- name 2 objects 2  Language- repeat 1  Language- follow 3 step command 2  Language- read & follow direction 1  Write a sentence 1  Copy design 1  Total score 28      Results for orders placed or performed in visit on 12/28/23  POCT glycosylated hemoglobin (Hb A1C)   Collection Time: 12/28/23  1:24 PM  Result Value Ref Range   Hemoglobin A1C 7.8 (A) 4.0 - 5.6 %   HbA1c POC (<> result, manual entry)     HbA1c, POC (prediabetic range)     HbA1c, POC (controlled diabetic range)        Assessment &  Plan:   Problem List Items Addressed This Visit     Essential  hypertension   Relevant Orders   AMB Referral [facility] Care Management   Type 2 diabetes mellitus with other specified complication (HCC) - Primary   Relevant Medications   MOUNJARO 2.5 MG/0.5ML Pen   Other Relevant Orders   POCT glycosylated hemoglobin (Hb A1C) (Completed)   AMB Referral [facility] Care Management   Other Visit Diagnoses       Long-term current use of injectable noninsulin antidiabetic medication         History of basal cell carcinoma (BCC) of eyelid       Relevant Orders   Ambulatory referral to Ophthalmology     Eye tearing, left       Relevant Orders   Ambulatory referral to Ophthalmology     Memory loss       Relevant Orders   AMB Referral [facility] Care Management         Type 2 diabetes mellitus Type 2 diabetes with improved HbA1c from 8.5% to 7.8%.  She has self discontinued Rybelsus  due to GI intolerance, also in past DC'd ozempic same situation  Discussed Mounjaro for appetite control and glycemic management, including side effects and insurance coverage. - Discontinue Rybelsus . - Initiate Mounjaro 2.5 mg weekly injection, provide one month sample. - Refer to clinical pharmacist [facility] for medication coordination and insurance coverage check and assistance with admin - Schedule follow-up in 3 months for glucose monitoring.  Memory Loss / Forgetfulness vs Mild cognitive impairment Today MMSE 28/30 passed. History difficult given both her and her husband have memory loss and each is blaming the other on separate histories, as he is my patient as well. Mild cognitive impairment with normal age-related forgetfulness. No treatment required, monitoring advised. - Monitor memory function. - Coordinate with case manager [facility] referral for in home needs assess  Epiphora (excessive tearing) Epiphora in left eye with history of basal cell carcinoma on lower eyelid - Refer to Dr[facility] Ign[facility]ius Makos at [facility] for evaluation.  Basal cell carcinoma of the eyelid Basal cell  carcinoma treated 7 years ago. Current concern for recurrence due to epiphora. - Refer to Dr. Ignatius Makos at [facility] for evaluation.          Orders Placed This Encounter  Procedures   AMB Referral [facility] Care Management    Referral Priority:   Routine    Referral Type:   Consultation    Referral Reason:   Care Coordination    Number of Visits Requested:   1   Ambulatory referral to Ophthalmology    Referral Priority:   Routine    Referral Type:   Consultation    Referral Reason:   Specialty Services Required    Requested Specialty:   Ophthalmology    Number of Visits Requested:   1   POCT glycosylated hemoglobin (Hb A1C)    Meds ordered this encounter  Medications   MOUNJARO 2.5 MG/0.5ML Pen    Sig: Inject 2.5 mg into the skin once a week.    Follow up plan: Return in about 3 months (around 03/28/2024) for 3 month DM A1c.  Domingo Friend, DO [facility] 12/28/2023, 1:35 PM

## 2023-12-28 NOTE — Patient Instructions (Addendum)
 Thank you for coming to the office today.  Recent Labs    06/15/23 0819 12/28/23 1324  HGBA1C 8.5* 7.8*    Blood sugar looks pretty good, it is nearly at goal in 7 range, goal to keep improving and reduce weight  Stop the Rybelsus   Start new injection weekly - Mounjaro 2.5mg  weekly sample. One dose a week. For 4 weeks then will need to pick up new order.  I am going refer you to our clinical pharmacist - Timoteo Force. She already works with [name], and she can call to coordinate your medication and check coverage.  You passed the memory quiz almost perfectly, slight difficulty with recall but that is normal for "forgetfulness, or mild impairment of memory"  Keep monitoring and our nursing team will contact you periodically to check in.  DUE for FASTING BLOOD WORK (no food or drink after midnight before the lab appointment, only water or coffee without cream/sugar on the morning of)  SCHEDULE "Lab Only" visit in the morning at the clinic for lab draw in 6 MONTHS   - Make sure Lab Only appointment is at about 1 week before your next appointment, so that results will be available  For Lab Results, once available within 2-3 days of blood draw, you can can log in to MyChart online to view your results and a brief explanation. Also, we can discuss results at next follow-up visit.   Please schedule a Follow-up Appointment to: Return in about 3 months (around 03/28/2024) for 3 month DM A1c.  If you have any other questions or concerns, please feel free to call the office or send a message through MyChart. You may also schedule an earlier appointment if necessary.  Additionally, you may be receiving a survey about your experience at our office within a few days to 1 week by e-mail or mail. We value your feedback.  Domingo Friend, DO [facility], [place]

## 2024-01-04 ENCOUNTER — Telehealth: Payer: Self-pay | Admitting: *Deleted

## 2024-01-04 DIAGNOSIS — R700 Elevated erythrocyte sedimentation rate: Secondary | ICD-10-CM | POA: Diagnosis not present

## 2024-01-04 NOTE — Progress Notes (Signed)
 Complex Care Management Note Care Guide Note  01/04/2024 Name: Otella Hetz MRN: [id number] DOB: 28-Nov-1945   Complex Care Management Outreach Attempts: An unsuccessful telephone outreach was attempted today to offer the patient information about available complex care management services.  Follow Up Plan:  Additional outreach attempts will be made to offer the patient complex care management information and services.   Encounter Outcome:  No Answer  Kandis Ormond, CMA [place]  [facility], [facility] Guide Direct Dial: [phone]  Fax: [phone] Website: [email or web address]

## 2024-01-05 ENCOUNTER — Ambulatory Visit: Admitting: Internal Medicine

## 2024-01-05 ENCOUNTER — Encounter: Payer: Self-pay | Admitting: Internal Medicine

## 2024-01-05 VITALS — BP 120/80 | HR 78 | Temp 98.3°F | Ht 63.0 in | Wt 276.6 lb

## 2024-01-05 DIAGNOSIS — J4520 Mild intermittent asthma, uncomplicated: Secondary | ICD-10-CM | POA: Diagnosis not present

## 2024-01-05 DIAGNOSIS — Z6841 Body Mass Index (BMI) 40.0 and over, adult: Secondary | ICD-10-CM

## 2024-01-05 DIAGNOSIS — R5381 Other malaise: Secondary | ICD-10-CM | POA: Diagnosis not present

## 2024-01-05 DIAGNOSIS — E669 Obesity, unspecified: Secondary | ICD-10-CM

## 2024-01-05 DIAGNOSIS — G4733 Obstructive sleep apnea (adult) (pediatric): Secondary | ICD-10-CM | POA: Diagnosis not present

## 2024-01-05 NOTE — Progress Notes (Signed)
 @Patient  ID: Natasha Curry, female    DOB: Jul 11, 1946, 78 y.o.   MRN: [id number]     CC  Follow-up OSA  HPI: 78 yo female seen for sleep consult 10/2019 to establish for for OSA (diagnosed around 2011)  History of morbid obesity s/p gastric bypass surgery , lost 100lbs but gained back.  Patient diagnosed with sleep apnea and on CPAP several visits in the past showed excellent compliance  Follow-up assessment for OSA Doing well with CPAP Excellent compliance per patient  Patient uses and benefits from therapy Using CPAP nightly and with naps Pressure setting is comfortable and is sleeping well. 100% for days 100% for greater than 4 hours Auto CPAP 4-14 AHI reduced to 5  Patient is morbidly obese at 277 pounds Patient has intermittent reactive airways disease asthma related to her obesity Patient started on Mounjaro  No exacerbation at this time No evidence of heart failure at this time No evidence or signs of infection at this time No respiratory distress No fevers, chills, nausea, vomiting, diarrhea No evidence of lower extremity edema No evidence hemoptysis    No Known Allergies  Immunization History  Administered Date(s) Administered   Fluad Quad(high Dose 65+) 05/19/2022   Fluad Trivalent(High Dose 65+) 09/29/2023   Influenza, High Dose Seasonal PF 05/12/2019, 05/29/2020, 08/05/2021   PFIZER(Purple Top)SARS-COV-2 Vaccination 10/22/2019, 11/12/2019, 06/15/2020   Pfizer Covid-19 Vaccine Bivalent Booster 7yrs & up 08/16/2021   Pneumococcal Conjugate-13 12/15/2018   Zoster Recombinant(Shingrix) 12/15/2018, 02/14/2019    Past Medical History:  Diagnosis Date   Allergy    Anxiety    Cancer (HCC)    basil cell   Colon polyp    Depression    Eczema    GERD (gastroesophageal reflux disease)    Kidney stones    Psoriasis    Sleep apnea    Tremor     Tobacco History: Social History   Tobacco Use  Smoking Status Never  Smokeless Tobacco Never    Counseling given: Not Answered   Outpatient Medications Prior to Visit  Medication Sig Dispense Refill   albuterol  (VENTOLIN  HFA) 108 (90 Base) MCG/ACT inhaler Inhale 2 puffs into the lungs every 4 (four) hours as needed for wheezing or shortness of breath. 8 g 2   atorvastatin  (LIPITOR) 10 MG tablet Take 1 tablet (10 mg total) by mouth daily. 90 tablet 3   cyclobenzaprine  (FLEXERIL ) 10 MG tablet TAKE 1 TABLET BY MOUTH AT BEDTIME AS NEEDED FOR MUSCLE SPASMS 30 tablet 2   desoximetasone (TOPICORT) 0.25 % cream Apply 1 Application topically 2 (two) times daily.     diclofenac  Sodium (VOLTAREN ) 1 % GEL Apply 2 g topically 4 (four) times daily as needed. 100 g 2   escitalopram  (LEXAPRO ) 20 MG tablet Take 1 tablet (20 mg total) by mouth daily. 90 tablet 3   lidocaine  (LIDODERM ) 5 % PLEASE SEE ATTACHED FOR DETAILED DIRECTIONS (Patient not taking: Reported on 12/28/2023)     metFORMIN  (GLUCOPHAGE ) 500 MG tablet Take 1 tablet (500 mg total) by mouth 2 (two) times daily with a meal. 180 tablet 3   metoprolol  succinate (TOPROL -XL) 25 MG 24 hr tablet Take 0.5 tablets (12.5 mg total) by mouth daily. 45 tablet 3   MOUNJARO 2.5 MG/0.5ML Pen Inject 2.5 mg into the skin once a week.     nystatin cream (MYCOSTATIN) APPLY A THIN LAYER TO AFFECTED AREA TWICE DAILY TO FOLDS     omeprazole  (PRILOSEC) 40 MG capsule Take 1 capsule (40  mg total) by mouth daily. 90 capsule 3   triamcinolone  cream (KENALOG ) 0.5 % Apply 1 Application topically 2 (two) times daily. To affected areas, for up to 2 weeks. 30 g 2   No facility-administered medications prior to visit.   BP 120/80 (BP Location: Right Arm, Patient Position: Sitting, Cuff Size: Large)   Pulse 78   Temp 98.3 F (36.8 C) (Oral)   Ht 5\' 3"  (1.6 m)   Wt 276 lb 9.6 oz (125.5 kg)   SpO2 98%   BMI 49.00 kg/m       Review of Systems: Gen:  Denies  fever, sweats, chills weight loss  HEENT: Denies blurred vision, double vision, ear pain, eye pain, hearing  loss, nose bleeds, sore throat Cardiac:  No dizziness, chest pain or heaviness, chest tightness,edema, No JVD Resp:   No cough, -sputum production, -shortness of breath,-wheezing, -hemoptysis,  Other:  All other systems negative   Physical Examination:   General Appearance: No distress  EYES PERRLA, EOM intact.   NECK Supple, No JVD Pulmonary: normal breath sounds, No wheezing.  CardiovascularNormal S1,S2.  No m/r/g.   Abdomen: Benign, Soft, non-tender. Neurology UE/LE 5/5 strength, no focal deficits Ext pulses intact, cap refill intact ALL OTHER ROS ARE NEGATIVE\   Assessment & Plan:   78 year old pleasant white female patient seen today for follow-up assessment for sleep apnea in the setting of morbid obesity, now with signs and symptoms of intermittent reactive airways disease and asthma   Assessment of OSA Continue CPAP as prescribed  Excellent compliance report Reviewed compliance report in detail with patient Patient definitely benefits the use of CPAP therapy as prescribed Using CPAP nightly and with naps Pressure setting is comfortable and is sleeping well. CPAP prescription auto CPAP 4-14 AHI reduced to 5  No evidence of acute heart failure at this time No respiratory distress No fevers, chills, nausea, vomiting, diarrhea No evidence hemoptysis  Patient Instructions Continue to use CPAP every night, minimum of 4-6 hours a night.  Change equipment every 30 days or as directed by DME.  Wash your tubing with warm soap and water daily, hang to dry. Wash humidifier portion weekly. Use bottled, distilled water and change daily   Be aware of reduced alertness and do not drive or operate heavy machinery if experiencing this or drowsiness.  Exercise encouraged, as tolerated. Encouraged proper weight management.  Important to get eight or more hours of sleep  Limiting the use of the computer and television before bedtime.  Decrease naps during the day, so night time  sleep will become enhanced.  Limit caffeine, and sleep deprivation.  HTN, stroke, uncontrolled diabetes and heart failure are potential risk factors.  Risk of untreated sleep apnea including cardiac arrhthymias, stroke, DM, pulm HTN.    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 Mason Sole neurology to assess for dementia   MEDICATION ADJUSTMENTS/LABS AND TESTS ORDERED: Continue CPAP as prescribed Can use albuterol  as needed for cough and shortness of breath Recommend weight loss   CURRENT MEDICATIONS REVIEWED AT LENGTH WITH PATIENT TODAY   Patient  satisfied with Plan of action and management. All questions answered  Follow up 1 year   I spent a total of 42 minutes reviewing chart data, face-to-face evaluation with the patient, counseling and coordination of care as detailed above.      Lady Pier, M.D.  [facility]  Medical Director [facility] Medical Director [facility] Cardio-Pulmonary Department

## 2024-01-05 NOTE — Patient Instructions (Signed)

## 2024-01-06 NOTE — Progress Notes (Unsigned)
 Complex Care Management Note Care Guide Note  01/06/2024 Name: Natasha Curry MRN: [id number] DOB: 02-Jul-1946   Complex Care Management Outreach Attempts: A second unsuccessful outreach was attempted today to offer the patient with information about available complex care management services.  Follow Up Plan:  Additional outreach attempts will be made to offer the patient complex care management information and services.   Encounter Outcome:  No Answer  Kandis Ormond, CMA [place]  [facility], [facility] Guide Direct Dial: [phone]  Fax: [phone] Website: [email or web address]

## 2024-01-09 NOTE — Progress Notes (Signed)
 Care Guide Pharmacy Note  01/09/2024 Name: Niko Aument MRN: [id number] DOB: 01-Sep-1946  Referred By: Raina Bunting, DO Reason for referral: Complex Care Management (Outreach to schedule referral with pharmacist ), Call Attempt #1, Call Attempt #2, and Call Attempt #3   Wilbert Monter is a 78 y.o. year old female who is a primary care patient of Raina Bunting, DO.  Dillen Gubser was referred to the pharmacist for assistance related to: DMII  Successful contact was made with the patient to discuss pharmacy services including being ready for the pharmacist to call at least 5 minutes before the scheduled appointment time and to have medication bottles and any blood pressure readings ready for review. The patient agreed to meet with the pharmacist via telephone visit on 01/13/2024  Kandis Ormond, CMA [place]  [facility], [facility] Guide Direct Dial: [phone]  Fax: [phone] Website: [email or web address]

## 2024-01-09 NOTE — Progress Notes (Signed)
 Complex Care Management Note Care Guide Note  01/09/2024 Name: Natasha Curry MRN: [id number] DOB: [birth year]   Complex Care Management Outreach Attempts: A third unsuccessful outreach was attempted today to offer the patient with information about available complex care management services.  Follow Up Plan:  No further outreach attempts will be made at this time. We have been unable to contact the patient to offer or enroll patient in complex care management services.  Encounter Outcome:  No Answer  Kandis Ormond, CMA [place]  [facility], [facility] Guide Direct Dial: [phone]  Fax: [phone] Website: [email or web address]

## 2024-01-13 ENCOUNTER — Other Ambulatory Visit: Admitting: Pharmacist

## 2024-01-13 DIAGNOSIS — E1169 Type 2 diabetes mellitus with other specified complication: Secondary | ICD-10-CM

## 2024-01-13 DIAGNOSIS — Z7985 Long-term (current) use of injectable non-insulin antidiabetic drugs: Secondary | ICD-10-CM

## 2024-01-13 NOTE — Progress Notes (Unsigned)
 01/13/2024 Name: Natasha Curry MRN: [id number] DOB: 1946/05/09  Chief Complaint  Patient presents with   Medication Management   Medication Assistance   Medication Adherence    Natasha Curry is a 78 y.o. year old female who presented for a telephone visit.   They were referred to the pharmacist by their PCP for assistance in managing diabetes and medication access.    Subjective:  Care Team: Primary Care Provider: Raina Bunting, DO ; Next Scheduled Visit: 04/03/2024 Pulmonologist: Kasa, Kurian, MD   Medication Access/Adherence  Current Pharmacy:  [facility] [phone] - [place], [place] - [address] [place] [address] Phone: [phone] Fax: [phone]   Patient reports affordability concerns with their medications: Yes  Patient reports access/transportation concerns to their pharmacy: No  Patient reports adherence concerns with their medications:  No    Reports uses weekly pillbox   Diabetes:  Current medications:  - metformin  500 mg twice daily - Mounjaro 2.5 mg weekly on Thursdays - started from sample (took third dose yesterday)   Reports tolerating well  Medications tried in the past: Rybelsus  (stomach upset), Ozempic (GI upset)  Reports has glucometer, but denies checking home blood sugar   Current meal patterns:  - Breakfast: ~4 Ritz crackers with peanut butter and coffee with sweet creamer; egg and 1 piece of toast 1 hour later - Lunch: leftovers from supper night before - Supper: slice of meatloaf with with mashed potatoes or ham sandwich - Snacks: potato chips or bananas - Drinks: water or occasionally ginger ale  Current physical activity: reports limited recently; does not like to go out too much  Statin therapy: atorvastatin  10 mg daily   Objective:  Lab Results  Component Value Date   HGBA1C 7.8 (A) 12/28/2023    Lab Results  Component Value Date   CREATININE 0.85 06/15/2023   BUN 12 06/15/2023   NA 141 06/15/2023    K 4.4 06/15/2023   CL 105 06/15/2023   CO2 28 06/15/2023    Lab Results  Component Value Date   CHOL 144 06/15/2023   HDL 31 (L) 06/15/2023   LDLCALC 86 06/15/2023   TRIG 175 (H) 06/15/2023   CHOLHDL 4.6 06/15/2023    Medications Reviewed Today     Reviewed by Ardis Becton, RPH-CPP (Pharmacist) on 01/13/24 at 1036  Med List Status: <None>   Medication Order Taking? Sig Documenting Provider Last Dose Status Informant  albuterol  (VENTOLIN  HFA) 108 (90 Base) MCG/ACT inhaler [id number]  Inhale 2 puffs into the lungs every 4 (four) hours as needed for wheezing or shortness of breath. Kasa, Kurian, MD  Active   atorvastatin  (LIPITOR) 10 MG tablet [id number] Yes Take 1 tablet (10 mg total) by mouth daily. Raina Bunting, DO Taking Active   cyclobenzaprine  (FLEXERIL ) 10 MG tablet [id number]  TAKE 1 TABLET BY MOUTH AT BEDTIME AS NEEDED FOR MUSCLE SPASMS Karamalegos, Kayleen Party, DO  Active   desoximetasone (TOPICORT) 0.25 % cream [id number]  Apply 1 Application topically 2 (two) times daily. [provider]  Active   escitalopram  (LEXAPRO ) 20 MG tablet [id number] Yes Take 1 tablet (20 mg total) by mouth daily. Raina Bunting, DO Taking Active   metFORMIN  (GLUCOPHAGE ) 500 MG tablet [id number] Yes Take 1 tablet (500 mg total) by mouth 2 (two) times daily with a meal. Romeo Co, Kayleen Party, DO Taking Active   metoprolol  succinate (TOPROL -XL) 25 MG 24 hr tablet [id number]  Take 0.5 tablets (12.5 mg total) by  mouth daily. Raina Bunting, DO  Active   MOUNJARO 2.5 MG/0.5ML Pen [id number] Yes Inject 2.5 mg into the skin once a week. Raina Bunting, DO Taking Active   nystatin cream (MYCOSTATIN) [id number]  APPLY A THIN LAYER TO AFFECTED AREA TWICE DAILY TO FOLDS [provider]  Active   omeprazole  (PRILOSEC) 40 MG capsule [id number] Yes Take 1 capsule (40 mg total) by mouth daily. Raina Bunting, DO Taking Active   triamcinolone   cream (KENALOG ) 0.5 % [id number]  Apply 1 Application topically 2 (two) times daily. To affected areas, for up to 2 weeks. Raina Bunting, DO  Active               Assessment/Plan:   Discuss importance of medication adherence. Encourage patient to continue to use weekly pillbox. In addition, recommend also using medication list from latest office visit as adherence aid  Identify patient in need of refill of metoprolol  ER  Patient to contact pharmacy to request refill  Based on review of summary of benefits from [facility] website, patient's plan has a 25% coinsurance cost for all tier 3 medications, including Mounjaro or Trulicity - Patient reports that this cost is unaffordable  Diabetes: - Currently uncontrolled - Reviewed long term cardiovascular and renal outcomes of uncontrolled blood sugar - Reviewed goal A1c, goal fasting, and goal 2 hour post prandial glucose - Reviewed dietary modifications including importance of having regular well-balanced meals and snacks throughout the day, while controlling carbohydrate portion sizes             Discuss ideas for balanced snacks             Encourage patient to increase consumption of non-starchy vegetables - Patient denies personal or family history of multiple endocrine neoplasia type 2, medullary thyroid cancer; personal history of pancreatitis or gallbladder disease. - Recommend to check glucose, keep log of results and have this record to review at upcoming medical appointments. Patient to contact provider office sooner if needed for readings outside of established parameters or symptoms - Reports planning to start walking gradually as discussed with PCP - Will collaborate with PCP regarding diabetes medication management options for patient. Recommend switch to Trulicity/application to [employer] patient assistance program    Follow Up Plan: Clinical Pharmacist will follow up with patient by telephone again within the  next 7 days  Arthur Lash, PharmD, Becky Bowels, CPP Clinical Pharmacist [facility] [phone]

## 2024-01-16 ENCOUNTER — Other Ambulatory Visit (HOSPITAL_COMMUNITY): Payer: Self-pay

## 2024-01-16 ENCOUNTER — Telehealth: Payer: Self-pay

## 2024-01-16 NOTE — Telephone Encounter (Signed)
 Pharmacy Patient Advocate Encounter  Insurance verification completed.   The patient is insured through [employer] claim for Trulicity. Currently a quantity of 2ml is a 28 day supply and the co-pay is $242.77 .   This test claim was processed through [facility]- copay amounts may vary at other pharmacies due to pharmacy/plan contracts, or as the patient moves through the different stages of their insurance plan.

## 2024-01-17 ENCOUNTER — Encounter: Payer: Self-pay | Admitting: Pharmacist

## 2024-01-17 ENCOUNTER — Telehealth: Payer: Self-pay

## 2024-01-17 ENCOUNTER — Other Ambulatory Visit (INDEPENDENT_AMBULATORY_CARE_PROVIDER_SITE_OTHER): Payer: Self-pay | Admitting: Pharmacist

## 2024-01-17 DIAGNOSIS — E1169 Type 2 diabetes mellitus with other specified complication: Secondary | ICD-10-CM

## 2024-01-17 DIAGNOSIS — Z7985 Long-term (current) use of injectable non-insulin antidiabetic drugs: Secondary | ICD-10-CM

## 2024-01-17 NOTE — Telephone Encounter (Signed)
 PAP: Patient assistance application for Trulicity through [employer] has been mailed to pt's home address on file. Provider portion of application will be faxed to provider's office.

## 2024-01-17 NOTE — Progress Notes (Unsigned)
   01/17/2024  Patient ID: Natasha Curry, female   DOB: [birth year], 78 y.o.   MRN: [id number]   Collaborated with both patient and PCP regarding diabetes medication management options for patient. Provider agrees with plan to apply for Trulicity patient assistance program - Meets financial criteria for Trulicity patient assistance program through [place]. Will collaborate with provider, CPhT, and patient to pursue assistance.  - Update provided to patient  Follow Up Plan: Clinical Pharmacist will follow up with patient by telephone on 02/13/2024 at 2:00 PM    Arthur Lash, PharmD, Becky Bowels, CPP Clinical Pharmacist [facility] [phone]

## 2024-01-17 NOTE — Patient Instructions (Signed)
 Please watch the mail for an envelope from [facility] containing the patient assistance program application. Please complete this application and bring to office to have it faxed back to Attention: Palmer Bobo at Fax # [phone] along with a copy of your Medicare Part D prescription card and a copy of your proof of income document.  If you need to call Jullie Oiler, you can reach her at [phone].  Thank you!  Arthur Lash, PharmD, Becky Bowels, CPP Clinical Pharmacist [facility] [phone]

## 2024-01-17 NOTE — Patient Instructions (Signed)
 Goals Addressed             This Visit's Progress    Pharmacy Goals       The goal A1c is less than 7%. This is the best way to reduce the risk of the long term complications of diabetes, including heart disease, kidney disease, eye disease, strokes, and nerve damage. An A1c of less than 7% corresponds with fasting sugars less than 130 and 2 hour after meal sugars less than 180.   Our goal bad cholesterol, or LDL, is less than 70 . This is why it is important to continue taking your atorvastatin .  Arthur Lash, PharmD, Becky Bowels, CPP Clinical Pharmacist [facility] [phone]

## 2024-01-18 NOTE — Telephone Encounter (Signed)
 Received Provider portion of PAP application with Medical exception form for Trulicity (lilly)

## 2024-01-19 ENCOUNTER — Telehealth: Payer: Self-pay | Admitting: *Deleted

## 2024-01-19 NOTE — Progress Notes (Signed)
 Complex Care Management Care Guide Note  01/19/2024 Name: [name] MRN: [id number] DOB: 1946/03/05  Natasha Curry is a 78 y.o. year old female who is a primary care patient of Raina Bunting, DO and is actively engaged with the care management team. I reached out to Barbee Lew by phone today to assist with scheduling  with the RN Case Manager.  Follow up plan: Unsuccessful telephone outreach attempt made. A HIPAA compliant phone message was left for the patient providing contact information and requesting a return call.  Kandis Ormond, CMA [place]  [facility], [facility] Guide Direct Dial: [phone]  Fax: [phone] Website: [email or web address]

## 2024-01-22 DIAGNOSIS — G4733 Obstructive sleep apnea (adult) (pediatric): Secondary | ICD-10-CM | POA: Diagnosis not present

## 2024-01-23 NOTE — Progress Notes (Signed)
 Complex Care Management Note Care Guide Note  01/23/2024 Name: Natasha Curry MRN: [id number] DOB: 10/18/1945   Complex Care Management Outreach Attempts: A second unsuccessful outreach was attempted today to offer the patient with information about available complex care management services.  Follow Up Plan:  Additional outreach attempts will be made to offer the patient complex care management information and services.   Encounter Outcome:  No Answer  Kandis Ormond, CMA [place]  [facility], [facility] Guide Direct Dial: [phone]  Fax: [phone] Website: [email or web address]

## 2024-01-26 NOTE — Progress Notes (Signed)
 Complex Care Management Note Care Guide Note  01/26/2024 Name: Natasha Curry MRN: [id number] DOB: [birth year]   Complex Care Management Outreach Attempts: A third unsuccessful outreach was attempted today to offer the patient with information about available complex care management services.  Follow Up Plan:  No further outreach attempts will be made at this time. We have been unable to contact the patient to offer or enroll patient in complex care management services.  Encounter Outcome:  No Answer  Kandis Ormond, CMA [place]  [facility], [facility] Guide Direct Dial: [phone]  Fax: [phone] Website: [email or web address]

## 2024-02-02 NOTE — Telephone Encounter (Signed)
 Reached out to patient to confirm receipt of PAP Application for Trulicity and had to leave HIPAA compliant v/m to return call if needed any assistance.

## 2024-02-06 NOTE — Telephone Encounter (Signed)
 PAP: Application for Trulicity has been submitted to [employer], via fax

## 2024-02-07 NOTE — Progress Notes (Signed)
 Pharmacy Medication Assistance Program Note    02/07/2024  Patient ID: Natasha Curry, female   DOB: July 28, 1946, 78 y.o.   MRN: [id number]     02/07/2024  Outreach Medication One  Initial Outreach Date (Medication One) 01/20/2024  Manufacturer Medication One [facility] Trulicity  Type of Assistance Manufacturer Assistance  Date Application Sent to Patient 01/20/2024  Application Items Requested Application;Proof of Income  Date Application Sent to Prescriber 01/20/2024  Name of Prescriber Domingo Friend  Date Application Received From Patient 02/06/2024  Application Items Received From Patient Application;Proof of Income  Date Application Received From Provider 01/23/2024  Date Application Submitted to Manufacturer 02/06/2024  Method Application Sent to Manufacturer Fax  Patient Assistance Determination Approved  Approval Start Date 02/07/2024  Approval End Date 09/05/2024  Patient Notification Method Telephone Call  Telephone Call Outcome Successful

## 2024-02-07 NOTE — Telephone Encounter (Signed)
 PAP: Patient assistance application for Trulicity has been approved by PAP Companies: [employer] from 02/07/2024 to 09/05/2024. Medication should be delivered to PAP Delivery: Provider's office. For further shipping updates, please contact [employer] at [phone]. Patient ID is: [id number]

## 2024-02-13 ENCOUNTER — Other Ambulatory Visit (INDEPENDENT_AMBULATORY_CARE_PROVIDER_SITE_OTHER): Admitting: Pharmacist

## 2024-02-13 ENCOUNTER — Telehealth: Payer: Self-pay | Admitting: Pharmacist

## 2024-02-13 DIAGNOSIS — Z7985 Long-term (current) use of injectable non-insulin antidiabetic drugs: Secondary | ICD-10-CM

## 2024-02-13 DIAGNOSIS — E1169 Type 2 diabetes mellitus with other specified complication: Secondary | ICD-10-CM

## 2024-02-13 NOTE — Patient Instructions (Signed)
 Goals Addressed             This Visit's Progress    Pharmacy Goals       The goal A1c is less than 7%. This is the best way to reduce the risk of the long term complications of diabetes, including heart disease, kidney disease, eye disease, strokes, and nerve damage. An A1c of less than 7% corresponds with fasting sugars less than 130 and 2 hour after meal sugars less than 180.   Our goal bad cholesterol, or LDL, is less than 70 . This is why it is important to continue taking your atorvastatin .  Arthur Lash, PharmD, Becky Bowels, CPP Clinical Pharmacist [facility] [phone]

## 2024-02-13 NOTE — Progress Notes (Signed)
 02/13/2024 Name: Natasha Curry MRN: [id number] DOB: 20-Apr-1946  Chief Complaint  Patient presents with   Medication Management   Medication Assistance    Natasha Curry is a 78 y.o. year old female who presented for a telephone visit.   They were referred to the pharmacist by their PCP for assistance in managing diabetes and medication access.      Subjective:   Care Team: Primary Care Provider: Raina Bunting, DO ; Next Scheduled Visit: 04/03/2024 Pulmonologist: Kasa, Kurian, MD  Medication Access/Adherence  Current Pharmacy:  [phone] [place], [place] - [address] [place] [address] Phone: [phone] Fax: [phone]   Patient reports affordability concerns with their medications: No  Patient reports access/transportation concerns to their pharmacy: No  Patient reports adherence concerns with their medications:  No     Reports uses weekly pillbox; reports recently missed 2 evening dose of metformin    Diabetes:   Current medications:  - metformin  500 mg twice daily - Trulicity 0.75 mg weekly on Fridays (started on 6/6)  Reports tolerating well   Medications tried in the past: Rybelsus  (stomach upset), Ozempic (GI upset), Mounjaro (cost)   Reports has glucometer, but denies checking home blood sugar   Reports working on increasing portion sizes of non-starchy vegetables    Current physical activity: reports limited recently, but planning to start walking more   Statin therapy: atorvastatin  10 mg daily  Current Medication Access Support: enrolled in patient assistance for Trulicity from [employer] through 09/05/2024 Reports received 4 month supply of Trulicity via mail last week   Objective:  Lab Results  Component Value Date   HGBA1C 7.8 (A) 12/28/2023    Lab Results  Component Value Date   CREATININE 0.85 06/15/2023   BUN 12 06/15/2023   NA 141 06/15/2023   K 4.4 06/15/2023   CL 105 06/15/2023   CO2 28 06/15/2023     Lab Results  Component Value Date   CHOL 144 06/15/2023   HDL 31 (L) 06/15/2023   LDLCALC 86 06/15/2023   TRIG 175 (H) 06/15/2023   CHOLHDL 4.6 06/15/2023    Medications Reviewed Today     Reviewed by Ardis Becton, RPH-CPP (Pharmacist) on 02/13/24 at 1559  Med List Status: <None>   Medication Order Taking? Sig Documenting Provider Last Dose Status Informant  albuterol  (VENTOLIN  HFA) 108 (90 Base) MCG/ACT inhaler [id number]  Inhale 2 puffs into the lungs every 4 (four) hours as needed for wheezing or shortness of breath. Kasa, Kurian, MD  Active   atorvastatin  (LIPITOR) 10 MG tablet [id number]  Take 1 tablet (10 mg total) by mouth daily. Raina Bunting, DO  Active   cyclobenzaprine  (FLEXERIL ) 10 MG tablet [id number]  TAKE 1 TABLET BY MOUTH AT BEDTIME AS NEEDED FOR MUSCLE SPASMS Karamalegos, Kayleen Party, DO  Active   desoximetasone (TOPICORT) 0.25 % cream [id number]  Apply 1 Application topically 2 (two) times daily. [provider]  Active   Dulaglutide (TRULICITY) 0.75 MG/0.5ML [place] [id number] Yes Inject 0.75 mg into the skin once a week. [provider] Taking Active   escitalopram  (LEXAPRO ) 20 MG tablet [id number]  Take 1 tablet (20 mg total) by mouth daily. Raina Bunting, DO  Active   metFORMIN  (GLUCOPHAGE ) 500 MG tablet [id number] Yes Take 1 tablet (500 mg total) by mouth 2 (two) times daily with a meal. Romeo Co, Kayleen Party, DO Taking Active   metoprolol  succinate (TOPROL -XL) 25 MG 24 hr tablet [id number]  Take 0.5 tablets (12.5 mg total) by mouth daily. Karamalegos, Kayleen Party, DO  Active   nystatin cream (MYCOSTATIN) [id number]  APPLY A THIN LAYER TO AFFECTED AREA TWICE DAILY TO FOLDS [provider]  Active   omeprazole  (PRILOSEC) 40 MG capsule [id number]  Take 1 capsule (40 mg total) by mouth daily. Raina Bunting, DO  Active   triamcinolone  cream (KENALOG ) 0.5 % [id number]  Apply 1 Application topically 2 (two) times  daily. To affected areas, for up to 2 weeks. Raina Bunting, DO  Active               Assessment/Plan:   Discuss importance of medication adherence. Encourage patient to continue to use weekly pillbox. In addition, recommend also using medication list from latest office visit as adherence aid     Diabetes: - Currently uncontrolled - Reviewed long term cardiovascular and renal outcomes of uncontrolled blood sugar - Reviewed goal A1c, goal fasting, and goal 2 hour post prandial glucose - Have reviewed dietary modifications including importance of having regular well-balanced meals and snacks throughout the day, while controlling carbohydrate portion sizes - Patient to follow up with [employer] patient assistance program as needed for refills of Trulicity - Recommend to check glucose, keep log of results and have this record to review at upcoming medical appointments. Patient to contact provider office sooner if needed for readings outside of established parameters or symptoms - Reports planning to start walking gradually as discussed with PCP       Follow Up Plan: Clinical Pharmacist will follow up with patient by telephone on 04/30/2024 at 2:00 PM    Arthur Lash, PharmD, Becky Bowels, CPP Clinical Pharmacist [facility] [phone]

## 2024-02-13 NOTE — Telephone Encounter (Addendum)
   Outreach Note  02/13/2024 Name: [name] MRN: [id number] DOB: 1945-10-26  Referred by: Raina Bunting, DO  Was unable to reach patient via telephone today and have left HIPAA compliant voicemail asking patient to return my call.    Arthur Lash, PharmD, Becky Bowels, CPP Clinical Pharmacist [facility] [phone]

## 2024-02-20 DIAGNOSIS — G4733 Obstructive sleep apnea (adult) (pediatric): Secondary | ICD-10-CM | POA: Diagnosis not present

## 2024-02-22 DIAGNOSIS — G4733 Obstructive sleep apnea (adult) (pediatric): Secondary | ICD-10-CM | POA: Diagnosis not present

## 2024-02-27 ENCOUNTER — Other Ambulatory Visit (INDEPENDENT_AMBULATORY_CARE_PROVIDER_SITE_OTHER): Payer: Self-pay | Admitting: Pharmacist

## 2024-02-27 DIAGNOSIS — Z7985 Long-term (current) use of injectable non-insulin antidiabetic drugs: Secondary | ICD-10-CM

## 2024-02-27 DIAGNOSIS — E1169 Type 2 diabetes mellitus with other specified complication: Secondary | ICD-10-CM

## 2024-02-27 MED ORDER — ONETOUCH DELICA PLUS LANCET30G MISC: 3 refills | Status: AC

## 2024-02-27 MED ORDER — ONETOUCH VERIO VI STRP: ORAL_STRIP | 3 refills | Status: AC

## 2024-02-27 MED ORDER — ONETOUCH VERIO FLEX SYSTEM W/DEVICE KIT: PACK | 0 refills | Status: AC

## 2024-02-27 NOTE — Progress Notes (Signed)
   02/27/2024  Patient ID: Natasha Curry, female   DOB: 12-Dec-1945, 78 y.o.   MRN: [id number]  Patient's husband reaching out to request prescriptions for blood glucose monitoring supplies for patient in order for her to be able to receive these through her insurance coverage.   Send prescriptions for One Touch Verio meter and supplies to pharmacy for patient as requested per protocol   Follow Up Plan: Clinical Pharmacist will follow up with patient by telephone on 03/12/2024 at 3:00 PM as previously scheduled   Sharyle Sia, PharmD, [facility] Clinical Pharmacist [facility] [phone]

## 2024-02-28 DIAGNOSIS — H43813 Vitreous degeneration, bilateral: Secondary | ICD-10-CM | POA: Diagnosis not present

## 2024-02-28 DIAGNOSIS — H2513 Age-related nuclear cataract, bilateral: Secondary | ICD-10-CM | POA: Diagnosis not present

## 2024-02-28 DIAGNOSIS — E119 Type 2 diabetes mellitus without complications: Secondary | ICD-10-CM | POA: Diagnosis not present

## 2024-02-28 DIAGNOSIS — H02105 Unspecified ectropion of left lower eyelid: Secondary | ICD-10-CM | POA: Diagnosis not present

## 2024-02-28 LAB — HM DIABETES EYE EXAM

## 2024-02-29 ENCOUNTER — Encounter: Payer: Self-pay | Admitting: Family Medicine

## 2024-03-12 ENCOUNTER — Other Ambulatory Visit: Admitting: Pharmacist

## 2024-03-12 DIAGNOSIS — E1169 Type 2 diabetes mellitus with other specified complication: Secondary | ICD-10-CM

## 2024-03-12 DIAGNOSIS — Z7985 Long-term (current) use of injectable non-insulin antidiabetic drugs: Secondary | ICD-10-CM

## 2024-03-12 NOTE — Patient Instructions (Signed)
 Goals Addressed             This Visit's Progress    Pharmacy Goals       The goal A1c is less than 7%. This is the best way to reduce the risk of the long term complications of diabetes, including heart disease, kidney disease, eye disease, strokes, and nerve damage. An A1c of less than 7% corresponds with fasting sugars less than 130 and 2 hour after meal sugars less than 180.   Our goal bad cholesterol, or LDL, is less than 70 . This is why it is important to continue taking your atorvastatin .  Arthur Lash, PharmD, Becky Bowels, CPP Clinical Pharmacist [facility] [phone]

## 2024-03-12 NOTE — Progress Notes (Signed)
 03/12/2024 Name: Natasha Curry MRN: [id number] DOB: December 26, 1945  Chief Complaint  Patient presents with   Medication Management   Medication Assistance    Natasha Curry is a 78 y.o. year old female who presented for a telephone visit.   They were referred to the pharmacist by their PCP for assistance in managing diabetes and medication access.      Subjective:   Care Team: Primary Care Provider: Edman Marsa PARAS, DO ; Next Scheduled Visit: 04/03/2024 Pulmonologist: Kasa, Kurian, MD  Medication Access/Adherence  [phone] [place], [place] - [address] [place] [address] Phone: [phone] Fax: [phone]   Patient reports affordability concerns with their medications: No  Patient reports access/transportation concerns to their pharmacy: No  Patient reports adherence concerns with their medications:  No     Reports uses weekly pillbox   Diabetes:   Current medications:  - metformin  500 mg twice daily - Trulicity 0.75 mg weekly on Fridays (started on 6/6)             Reports tolerating well   Medications tried in the past: Rybelsus  (stomach upset), Ozempic (GI upset), Mounjaro  (cost)   Confirms picked up new glucometer and testing supplies. Recent morning fasting blood sugar readings ranging 108-116   Reports working on increasing portion sizes of non-starchy vegetables (steamed broccoli and spinach) avoiding sugary beverages   Reports has noticed appetite control benefit since started Trulicity   Current physical activity: reports limited recently, but planning to start walking more   Statin therapy: atorvastatin  10 mg daily   Current Medication Access Support: enrolled in patient assistance for Trulicity from [employer] through 09/05/2024    Objective:  Lab Results  Component Value Date   HGBA1C 7.8 (A) 12/28/2023    Lab Results  Component Value Date   CREATININE 0.85 06/15/2023   BUN 12 06/15/2023   NA 141  06/15/2023   K 4.4 06/15/2023   CL 105 06/15/2023   CO2 28 06/15/2023    Lab Results  Component Value Date   CHOL 144 06/15/2023   HDL 31 (L) 06/15/2023   LDLCALC 86 06/15/2023   TRIG 175 (H) 06/15/2023   CHOLHDL 4.6 06/15/2023    Medications Reviewed Today     Reviewed by Natasha Curry, RPH-CPP (Pharmacist) on 03/12/24 at 1504  Med List Status: <None>   Medication Order Taking? Sig Documenting Provider Last Dose Status Informant  albuterol  (VENTOLIN  HFA) 108 (90 Base) MCG/ACT inhaler [id number]  Inhale 2 puffs into the lungs every 4 (four) hours as needed for wheezing or shortness of breath. Kasa, Kurian, MD  Active   atorvastatin  (LIPITOR) 10 MG tablet [id number]  Take 1 tablet (10 mg total) by mouth daily. Natasha Marsa PARAS, DO  Active   Blood Glucose Monitoring Suppl (ONETOUCH VERIO FLEX SYSTEM) w/Device KIT [id number]  Use to check blood sugar up to twice daily as directed Natasha Marsa PARAS, DO  Active   cyclobenzaprine  (FLEXERIL ) 10 MG tablet [id number]  TAKE 1 TABLET BY MOUTH AT BEDTIME AS NEEDED FOR MUSCLE SPASMS Karamalegos, Marsa PARAS, DO  Active   desoximetasone (TOPICORT) 0.25 % cream [id number]  Apply 1 Application topically 2 (two) times daily. [provider]  Active   Dulaglutide (TRULICITY) 0.75 MG/0.5ML Natasha Curry [id number] Yes Inject 0.75 mg into the skin once a week. [provider]  Active   escitalopram  (LEXAPRO ) 20 MG tablet [id number]  Take 1 tablet (20 mg total) by mouth  daily. Natasha Marsa PARAS, DO  Active   glucose blood [facility] VERIO) test strip [id number]  Use to check blood sugar twice daily Natasha Marsa PARAS, DO  Active   Lancets [facility] [place]) MISC [id number]  Use to check blood sugar twice daily Natasha Marsa PARAS, DO  Active   metFORMIN  (GLUCOPHAGE ) 500 MG tablet [id number] Yes Take 1 tablet (500 mg total) by mouth 2 (two) times daily with a meal. Natasha, Marsa PARAS, DO  Active    metoprolol  succinate (TOPROL -XL) 25 MG 24 hr tablet [id number]  Take 0.5 tablets (12.5 mg total) by mouth daily. Karamalegos, Marsa PARAS, DO  Active   nystatin cream (MYCOSTATIN) [id number]  APPLY A THIN LAYER TO AFFECTED AREA TWICE DAILY TO FOLDS [provider]  Active   omeprazole  (PRILOSEC) 40 MG capsule [id number]  Take 1 capsule (40 mg total) by mouth daily. Natasha Marsa PARAS, DO  Active   triamcinolone  cream (KENALOG ) 0.5 % [id number]  Apply 1 Application topically 2 (two) times daily. To affected areas, for up to 2 weeks. Natasha Marsa PARAS, DO  Active               Assessment/Plan:   Discuss importance of medication adherence. Encourage patient to continue to use weekly pillbox. Encourage to use weekly phone alarm to aid with adherence to Trulicity     Diabetes: - Currently uncontrolled - Reviewed long term cardiovascular and renal outcomes of uncontrolled blood sugar - Reviewed goal A1c, goal fasting, and goal 2 hour post prandial glucose - Have reviewed dietary modifications including importance of having regular well-balanced meals and snacks throughout the day, while controlling carbohydrate portion sizes - Patient to follow up with [employer] patient assistance program as needed for refills of Trulicity - Recommend to check glucose, keep log of results and have this record to review at upcoming medical appointments. Patient to contact provider office sooner if needed for readings outside of established parameters or symptoms - Reports planning to start walking gradually as discussed with PCP       Follow Up Plan: Clinical Pharmacist will follow up with patient by telephone on 04/30/2024 at 2:00 PM    Natasha Curry, PharmD, Natasha Curry, CPP Clinical Pharmacist [facility] [phone]

## 2024-03-23 DIAGNOSIS — G4733 Obstructive sleep apnea (adult) (pediatric): Secondary | ICD-10-CM | POA: Diagnosis not present

## 2024-03-24 DIAGNOSIS — G35 Multiple sclerosis: Secondary | ICD-10-CM | POA: Diagnosis not present

## 2024-04-03 ENCOUNTER — Ambulatory Visit: Admitting: Family Medicine

## 2024-04-06 ENCOUNTER — Ambulatory Visit: Payer: Medicare HMO

## 2024-04-06 DIAGNOSIS — Z0000 Encounter for general adult medical examination without abnormal findings: Secondary | ICD-10-CM

## 2024-04-06 NOTE — Patient Instructions (Signed)
 Ms. Minchew , Thank you for taking time out of your busy schedule to complete your Annual Wellness Visit with me. I enjoyed our conversation and look forward to speaking with you again next year. I, as well as your care team,  appreciate your ongoing commitment to your health goals. Please review the following plan we discussed and let me know if I can assist you in the future.   Follow up Visits: 04/12/25 @ 4 PM BY PHONE We will see or speak with you next year for your Next Medicare AWV with our clinical staff Have you seen your provider in the last 6 months (3 months if uncontrolled diabetes)? Yes  Clinician Recommendations:  Aim for 30 minutes of exercise or brisk walking, 6-8 glasses of water, and 5 servings of fruits and vegetables each day. TAKE CARE!      This is a list of the screenings recommended for you:  Health Maintenance  Topic Date Due   Hepatitis C Screening  Never done   DTaP/Tdap/Td vaccine (1 - Tdap) Never done   Pneumococcal Vaccine for age over 60 (2 of 2 - PPSV23, PCV20, or PCV21) 02/09/2019   COVID-19 Vaccine (5 - 2024-25 season) 05/08/2023   Flu Shot  04/06/2024   Complete foot exam   06/09/2024   Yearly kidney function blood test for diabetes  06/14/2024   Yearly kidney health urinalysis for diabetes  06/14/2024   Hemoglobin A1C  06/28/2024   Eye exam for diabetics  02/27/2025   Medicare Annual Wellness Visit  04/06/2025   DEXA scan (bone density measurement)  Completed   Zoster (Shingles) Vaccine  Completed   Hepatitis B Vaccine  Aged Out   HPV Vaccine  Aged Out   Meningitis B Vaccine  Aged Out   Colon Cancer Screening  Discontinued    Advanced directives: (ACP Link)Information on Advanced Care Planning can be found at [place]  Secretary of [facility] Advance Health Care Directives Advance Health Care Directives. [email or web address]  Advance Care Planning is important because it:  [x]  Makes sure you receive the medical care that is consistent with your values, goals,  and preferences  [x]  It provides guidance to your family and loved ones and reduces their decisional burden about whether or not they are making the right decisions based on your wishes.  Follow the link provided in your after visit summary or read over the paperwork we have mailed to you to help you started getting your Advance Directives in place. If you need assistance in completing these, please reach out to us  so that we can help you!

## 2024-04-06 NOTE — Progress Notes (Signed)
 Subjective:   Natasha Curry is a 78 y.o. who presents for a Medicare Wellness preventive visit.  As a reminder, Annual Wellness Visits don't include a physical exam, and some assessments may be limited, especially if this visit is performed virtually. We may recommend an in-person follow-up visit with your provider if needed.  Visit Complete: Virtual I connected with  Natasha Curry on 04/06/24 by a audio enabled telemedicine application and verified that I am speaking with the correct person using two identifiers.  Patient Location: Home  Provider Location: Home Office  I discussed the limitations of evaluation and management by telemedicine. The patient expressed understanding and agreed to proceed.  Vital Signs: Because this visit was a virtual/telehealth visit, some criteria may be missing or patient reported. Any vitals not documented were not able to be obtained and vitals that have been documented are patient reported.  VideoDeclined- This patient declined Librarian, academic. Therefore the visit was completed with audio only.  Persons Participating in Visit: Patient.  AWV Questionnaire: No: Patient Medicare AWV questionnaire was not completed prior to this visit.  Cardiac Risk Factors include: advanced age (>76men, >57 women);diabetes mellitus;dyslipidemia;hypertension;sedentary lifestyle;obesity (BMI >30kg/m2)     Objective:    There were no vitals filed for this visit. There is no height or weight on file to calculate BMI.     04/06/2024    9:38 AM 04/01/2023   10:01 AM 03/26/2022   11:00 AM 03/24/2021   11:08 AM 04/07/2020   11:14 AM  Advanced Directives  Does Patient Have a Medical Advance Directive? No Yes No Yes No  Type of Special educational needs teacher of [place];Living will  Healthcare Power of [place];Living will   Copy of Healthcare Power of Attorney in Chart?  No - copy requested  No - copy requested   Would patient like  information on creating a medical advance directive? No - Patient declined No - Patient declined No - Patient declined  No - Patient declined    Current Medications (verified) Outpatient Encounter Medications as of 04/06/2024  Medication Sig   albuterol  (VENTOLIN  HFA) 108 (90 Base) MCG/ACT inhaler Inhale 2 puffs into the lungs every 4 (four) hours as needed for wheezing or shortness of breath.   atorvastatin  (LIPITOR) 10 MG tablet Take 1 tablet (10 mg total) by mouth daily.   Blood Glucose Monitoring Suppl (ONETOUCH VERIO FLEX SYSTEM) w/Device KIT Use to check blood sugar up to twice daily as directed   cyclobenzaprine  (FLEXERIL ) 10 MG tablet TAKE 1 TABLET BY MOUTH AT BEDTIME AS NEEDED FOR MUSCLE SPASMS   desoximetasone (TOPICORT) 0.25 % cream Apply 1 Application topically 2 (two) times daily.   Dulaglutide (TRULICITY) 0.75 MG/0.5ML SOAJ Inject 0.75 mg into the skin once a week.   escitalopram  (LEXAPRO ) 20 MG tablet Take 1 tablet (20 mg total) by mouth daily.   glucose blood (ONETOUCH VERIO) test strip Use to check blood sugar twice daily   Lancets (ONETOUCH DELICA PLUS LANCET30G) MISC Use to check blood sugar twice daily   metFORMIN  (GLUCOPHAGE ) 500 MG tablet Take 1 tablet (500 mg total) by mouth 2 (two) times daily with a meal.   metoprolol  succinate (TOPROL -XL) 25 MG 24 hr tablet Take 0.5 tablets (12.5 mg total) by mouth daily.   nystatin cream (MYCOSTATIN) APPLY A THIN LAYER TO AFFECTED AREA TWICE DAILY TO FOLDS   omeprazole  (PRILOSEC) 40 MG capsule Take 1 capsule (40 mg total) by mouth daily.   triamcinolone  cream (KENALOG )  0.5 % Apply 1 Application topically 2 (two) times daily. To affected areas, for up to 2 weeks.   No facility-administered encounter medications on file as of 04/06/2024.    Allergies (verified) Patient has no known allergies.   History: Past Medical History:  Diagnosis Date   Allergy    Anxiety    Cancer (HCC)    basil cell   Colon polyp    Depression     Eczema    GERD (gastroesophageal reflux disease)    Kidney stones    Psoriasis    Sleep apnea    Tremor    Past Surgical History:  Procedure Laterality Date   CHOLECYSTECTOMY  2000   GASTRIC BYPASS  2013   TOTAL KNEE ARTHROPLASTY  2014   Family History  Problem Relation Age of Onset   Cancer Mother        adrenal   Heart attack Father    Heart disease Father    Stroke Father    Breast cancer Neg Hx    Social History   Socioeconomic History   Marital status: Married    Spouse name: Not on file   Number of children: Not on file   Years of education: Not on file   Highest education level: Not on file  Occupational History   Not on file  Tobacco Use   Smoking status: Never   Smokeless tobacco: Never  Vaping Use   Vaping status: Never Used  Substance and Sexual Activity   Alcohol use: Yes    Alcohol/week: 3.0 standard drinks of alcohol    Types: 3 Standard drinks or equivalent per week   Drug use: Never   Sexual activity: Not on file  Other Topics Concern   Not on file  Social History Narrative   Not on file   Social Drivers of Health   Financial Resource Strain: Low Risk  (04/06/2024)   Overall Financial Resource Strain (CARDIA)    Difficulty of Paying Living Expenses: Not hard at all  Food Insecurity: No Food Insecurity (04/06/2024)   Hunger Vital Sign    Worried About Running Out of Food in the Last Year: Never true    Ran Out of Food in the Last Year: Never true  Transportation Needs: No Transportation Needs (04/06/2024)   PRAPARE - Administrator, Civil Service (Medical): No    Lack of Transportation (Non-Medical): No  Physical Activity: Inactive (04/06/2024)   Exercise Vital Sign    Days of Exercise per Week: 0 days    Minutes of Exercise per Session: 0 min  Stress: No Stress Concern Present (04/06/2024)   [employer] of Occupational Health - Occupational Stress Questionnaire    Feeling of Stress: Not at all  Social Connections: Moderately  Isolated (04/06/2024)   Social Connection and Isolation Panel    Frequency of Communication with Friends and Family: More than three times a week    Frequency of Social Gatherings with Friends and Family: Never    Attends Religious Services: Never    Database administrator or Organizations: No    Attends Engineer, structural: Never    Marital Status: Married    Tobacco Counseling Counseling given: Not Answered    Clinical Intake:  Pre-visit preparation completed: Yes  Pain : No/denies pain     BMI - recorded: 48.9 Nutritional Status: BMI > 30  Obese Nutritional Risks: None Diabetes: Yes CBG done?: No Did pt. bring in CBG monitor from home?:  No  Lab Results  Component Value Date   HGBA1C 7.8 (A) 12/28/2023   HGBA1C 8.5 (H) 06/15/2023   HGBA1C 7.7 (H) 03/24/2022     How often do you need to have someone help you when you read instructions, pamphlets, or other written materials from your doctor or pharmacy?: 1 - Never  Interpreter Needed?: No  Information entered by :: JHONNIE DAS, LPN   Activities of Daily Living    04/06/2024    9:40 AM 06/10/2023    2:28 PM  In your present state of health, do you have any difficulty performing the following activities:  Hearing? 1 0  Vision? 0 0  Difficulty concentrating or making decisions? 1 0  Walking or climbing stairs? 0 0  Dressing or bathing? 0 0  Doing errands, shopping? 0 0  Preparing Food and eating ? N   Using the Toilet? N   In the past six months, have you accidently leaked urine? N   Do you have problems with loss of bowel control? N   Managing your Medications? N   Managing your Finances? N   Housekeeping or managing your Housekeeping? N     Patient Care Team: Edman Marsa PARAS, DO as PCP - General (Family Medicine) Alana, Sharyle LABOR, RPH-CPP as Pharmacist Pa, [facility] (Optometry)  I have updated your Care Teams any recent Medical Services you may have received from other  providers in the past year.     Assessment:   This is a routine wellness examination for Natasha Curry.  Hearing/Vision screen Hearing Screening - Comments:: WEARS AIDS, BOTH EARS Vision Screening - Comments:: READERS-  EYE, HAD VISIT 2 MONTHS AGO   Goals Addressed             This Visit's Progress    DIET - INCREASE WATER INTAKE         Depression Screen     04/06/2024    9:37 AM 12/28/2023    1:27 PM 06/10/2023    2:28 PM 04/01/2023   10:00 AM 03/31/2022    9:53 AM 03/26/2022   10:58 AM 12/23/2021    8:27 AM  PHQ 2/9 Scores  PHQ - 2 Score 0 0 0 0 0 0 0  PHQ- 9 Score 0 4 2 0 7 0 3    Fall Risk     04/06/2024    9:40 AM 12/28/2023    1:27 PM 06/10/2023    2:28 PM 04/01/2023   10:03 AM 03/25/2023   10:45 AM  Fall Risk   Falls in the past year? 0 0 0 1 1  Number falls in past yr: 0   0 0  Injury with Fall? 0  0 0 1  Risk for fall due to : No Fall Risks   History of fall(s)   Follow up Falls evaluation completed;Falls prevention discussed   Falls evaluation completed;Falls prevention discussed     MEDICARE RISK AT HOME:  Medicare Risk at Home Any stairs in or around the home?: Yes If so, are there any without handrails?: No Home free of loose throw rugs in walkways, pet beds, electrical cords, etc?: Yes Adequate lighting in your home to reduce risk of falls?: Yes Life alert?: No Use of a cane, walker or w/c?: No Grab bars in the bathroom?: Yes Shower chair or bench in shower?: No Elevated toilet seat or a handicapped toilet?: No  TIMED UP AND GO:  Was the test performed?  No  Cognitive Function:  6CIT completed    12/28/2023    1:18 PM  MMSE - Mini Mental State Exam  Orientation to time 5  Orientation to Place 5  Registration 3  Attention/ Calculation 5  Recall 2  Language- name 2 objects 2  Language- repeat 1  Language- follow 3 step command 2  Language- read & follow direction 1  Write a sentence 1  Copy design 1  Total score 28        04/06/2024     9:42 AM 04/01/2023   10:04 AM 03/26/2022   11:01 AM 03/24/2021   11:12 AM  6CIT Screen  What Year? 0 points 0 points 0 points 0 points  What month? 0 points 0 points 0 points 0 points  What time? 0 points 0 points 0 points 3 points  Count back from 20 0 points 0 points 0 points 0 points  Months in reverse 0 points 0 points 0 points 2 points  Repeat phrase 0 points 0 points 0 points 2 points  Total Score 0 points 0 points 0 points 7 points    Immunizations Immunization History  Administered Date(s) Administered   Fluad Quad(high Dose 65+) 05/19/2022   Fluad Trivalent(High Dose 65+) 09/29/2023   Influenza, High Dose Seasonal PF 05/12/2019, 05/29/2020, 08/05/2021   Influenza-Unspecified 08/01/2023   PFIZER(Purple Top)SARS-COV-2 Vaccination 10/22/2019, 11/12/2019, 06/15/2020   Pfizer Covid-19 Vaccine Bivalent Booster 79yrs & up 08/16/2021   Pneumococcal Conjugate-13 12/15/2018   Zoster Recombinant(Shingrix) 12/15/2018, 02/14/2019    Screening Tests Health Maintenance  Topic Date Due   Hepatitis C Screening  Never done   DTaP/Tdap/Td (1 - Tdap) Never done   Pneumococcal Vaccine: 50+ Years (2 of 2 - PPSV23, PCV20, or PCV21) 02/09/2019   COVID-19 Vaccine (5 - 2024-25 season) 05/08/2023   INFLUENZA VACCINE  04/06/2024   FOOT EXAM  06/09/2024   Diabetic kidney evaluation - eGFR measurement  06/14/2024   Diabetic kidney evaluation - Urine ACR  06/14/2024   HEMOGLOBIN A1C  06/28/2024   OPHTHALMOLOGY EXAM  02/27/2025   Medicare Annual Wellness (AWV)  04/06/2025   DEXA SCAN  Completed   Zoster Vaccines- Shingrix  Completed   Hepatitis B Vaccines  Aged Out   HPV VACCINES  Aged Out   Meningococcal B Vaccine  Aged Out   Colonoscopy  Discontinued    Health Maintenance  Health Maintenance Due  Topic Date Due   Hepatitis C Screening  Never done   DTaP/Tdap/Td (1 - Tdap) Never done   Pneumococcal Vaccine: 50+ Years (2 of 2 - PPSV23, PCV20, or PCV21) 02/09/2019   COVID-19 Vaccine (5  - 2024-25 season) 05/08/2023   INFLUENZA VACCINE  04/06/2024   Health Maintenance Items Addressed: DECLINES BDS, AGED OUT OF MAMMOGRAM & COLONOSCOPY- NEEDS TDAP, COVID & PNA  Additional Screening:  Vision Screening: Recommended annual ophthalmology exams for early detection of glaucoma and other disorders of the eye. Would you like a referral to an eye doctor? No    Dental Screening: Recommended annual dental exams for proper oral hygiene  Community Resource Referral / Chronic Care Management: CRR required this visit?  No   CCM required this visit?  No   Plan:    I have personally reviewed and noted the following in the patient's chart:   Medical and social history Use of alcohol, tobacco or illicit drugs  Current medications and supplements including opioid prescriptions. Patient is not currently taking opioid prescriptions. Functional ability and status Nutritional status Physical activity Advanced directives  List of other physicians Hospitalizations, surgeries, and ER visits in previous 12 months Vitals Screenings to include cognitive, depression, and falls Referrals and appointments  In addition, I have reviewed and discussed with patient certain preventive protocols, quality metrics, and best practice recommendations. A written personalized care plan for preventive services as well as general preventive health recommendations were provided to patient.   Jhonnie GORMAN Das, LPN   09/13/7972   After Visit Summary: (MyChart) Due to this being a telephonic visit, the after visit summary with patients personalized plan was offered to patient via MyChart   Notes: Nothing significant to report at this time.

## 2024-04-23 DIAGNOSIS — G4733 Obstructive sleep apnea (adult) (pediatric): Secondary | ICD-10-CM | POA: Diagnosis not present

## 2024-04-24 DIAGNOSIS — L400 Psoriasis vulgaris: Secondary | ICD-10-CM | POA: Diagnosis not present

## 2024-04-25 ENCOUNTER — Encounter: Payer: Self-pay | Admitting: Family Medicine

## 2024-04-25 ENCOUNTER — Other Ambulatory Visit: Payer: Self-pay | Admitting: Family Medicine

## 2024-04-25 ENCOUNTER — Ambulatory Visit (INDEPENDENT_AMBULATORY_CARE_PROVIDER_SITE_OTHER): Admitting: Family Medicine

## 2024-04-25 VITALS — BP 124/70 | HR 72 | Ht 63.0 in | Wt 284.0 lb

## 2024-04-25 DIAGNOSIS — Z6841 Body Mass Index (BMI) 40.0 and over, adult: Secondary | ICD-10-CM

## 2024-04-25 DIAGNOSIS — G4733 Obstructive sleep apnea (adult) (pediatric): Secondary | ICD-10-CM

## 2024-04-25 DIAGNOSIS — E782 Mixed hyperlipidemia: Secondary | ICD-10-CM

## 2024-04-25 DIAGNOSIS — E1169 Type 2 diabetes mellitus with other specified complication: Secondary | ICD-10-CM | POA: Diagnosis not present

## 2024-04-25 DIAGNOSIS — Z7985 Long-term (current) use of injectable non-insulin antidiabetic drugs: Secondary | ICD-10-CM | POA: Diagnosis not present

## 2024-04-25 DIAGNOSIS — I10 Essential (primary) hypertension: Secondary | ICD-10-CM

## 2024-04-25 DIAGNOSIS — Z0000 Encounter for general adult medical examination without abnormal findings: Secondary | ICD-10-CM

## 2024-04-25 LAB — POCT GLYCOSYLATED HEMOGLOBIN (HGB A1C): Hemoglobin A1C: 6.6 % — AB (ref 4.0–5.6)

## 2024-04-25 NOTE — Progress Notes (Signed)
 Subjective:    Patient ID: Natasha Curry, female    DOB: 10/02/1945, 78 y.o.   MRN: [id number]  Natasha Curry is a 78 y.o. female presenting on 04/25/2024 for Medical Management of Chronic Issues and Diabetes   HPI  Discussed the use of AI scribe software for clinical note transcription with the patient, who gave verbal consent to proceed.  History of Present Illness   Natasha Curry is a 78 year old female with type 2 diabetes who presents with weight gain and increased appetite.  Morbid Obesity BMI >50 Weight gain and hyperphagia - Significant weight gain of over ten pounds, current weight 284 pounds (previously in the 270s) - Frequent snacking, particularly on potato chips in addition to regular meals - Describes eating pattern as 'addictive' and a 'habit' - Expresses concern about lack of willpower to resist eating  - Daytime fatigue with ability to sleep for two hours during the day - Uses CPAP machine at night for sleep apnea, which improves nocturnal sleep quality - Attributes fatigue to body weight, stating 'when you're fat all you want to do is rest and sleep'     Chronic Back Pain Improved on muscle relaxants will need re order, takes Robaxin  during day and Flexeril  at night due to sedation. Tolerating well.   OSA, on CPAP - Patient reports prior history of dx OSA and on CPAP - Today reports that sleep apnea is well controlled. She uses the CPAP machine every night. Tolerates the machine well, and thinks that sleeps better with it and feels good. No new concerns or symptoms.   CHRONIC DM, Type 2: - A1c improved to 6.6 from previous levels of 8.5 and 7.8 Interested in dose adjustment. Trulicity  has not reduced appetite Meds: Trulicity  0.75mg  weekly inj, Metformin  500mg  TWICE A DAY Reports good compliance. Tolerating well w/o side-effects Denies hypoglycemia, polyuria, visual changes, numbness or tingling.       04/06/2024    9:37 AM 12/28/2023    1:27 PM 06/10/2023     2:28 PM  Depression screen PHQ 2/9  Decreased Interest 0 0 0  Down, Depressed, Hopeless 0 0 0  PHQ - 2 Score 0 0 0  Altered sleeping 0 1 0  Tired, decreased energy 0 1 2  Change in appetite 0 2 0  Feeling bad or failure about yourself  0 0 0  Trouble concentrating 0 0 0  Moving slowly or fidgety/restless 0 0 0  Suicidal thoughts 0 0 0  PHQ-9 Score 0 4 2  Difficult doing work/chores Not difficult at all Not difficult at all Not difficult at all       12/28/2023    1:27 PM 06/10/2023    2:28 PM 03/31/2022    9:53 AM 12/23/2021    8:28 AM  GAD 7 : Generalized Anxiety Score  Nervous, Anxious, on Edge 1 0 0 0  Control/stop worrying 0 0 0 0  Worry too much - different things 0 0 0 0  Trouble relaxing 0 0 0 0  Restless 0 0 0 0  Easily annoyed or irritable 0 0 0 0  Afraid - awful might happen 0 0 0 0  Total GAD 7 Score 1 0 0 0  Anxiety Difficulty Not difficult at all  Not difficult at all Not difficult at all    Social History   Tobacco Use   Smoking status: Never   Smokeless tobacco: Never  Vaping Use   Vaping status: Never Used  Substance  Use Topics   Alcohol use: Yes    Alcohol/week: 3.0 standard drinks of alcohol    Types: 3 Standard drinks or equivalent per week   Drug use: Never    Review of Systems Per HPI unless specifically indicated above     Objective:    BP 124/70 (BP Location: Left Arm, Patient Position: Sitting, Cuff Size: Large)   Pulse 72   Ht 5' 3 (1.6 m)   Wt 284 lb (128.8 kg)   SpO2 94%   BMI 50.31 kg/m   Wt Readings from Last 3 Encounters:  04/25/24 284 lb (128.8 kg)  01/05/24 276 lb 9.6 oz (125.5 kg)  12/28/23 278 lb 3.2 oz (126.2 kg)    Physical Exam Vitals and nursing note reviewed.  Constitutional:      General: She is not in acute distress.    Appearance: She is well-developed. She is obese. She is not diaphoretic.     Comments: Well-appearing, comfortable, cooperative  HENT:     Head: Normocephalic and atraumatic.  Eyes:      General:        Right eye: No discharge.        Left eye: No discharge.     Conjunctiva/sclera: Conjunctivae normal.  Neck:     Thyroid: No thyromegaly.  Cardiovascular:     Rate and Rhythm: Normal rate and regular rhythm.     Heart sounds: Normal heart sounds. No murmur heard. Pulmonary:     Effort: Pulmonary effort is normal. No respiratory distress.     Breath sounds: Normal breath sounds. No wheezing or rales.  Musculoskeletal:        General: Normal range of motion.     Cervical back: Normal range of motion and neck supple.  Lymphadenopathy:     Cervical: No cervical adenopathy.  Skin:    General: Skin is warm and dry.     Findings: No erythema or rash.  Neurological:     Mental Status: She is alert and oriented to person, place, and time.  Psychiatric:        Behavior: Behavior normal.     Comments: Well groomed, good eye contact, normal speech and thoughts     Recent Labs    06/15/23 0819 12/28/23 1324 04/25/24 1503  HGBA1C 8.5* 7.8* 6.6*    Results for orders placed or performed in visit on 04/25/24  POCT HgB A1C   Collection Time: 04/25/24  3:03 PM  Result Value Ref Range   Hemoglobin A1C 6.6 (A) 4.0 - 5.6 %   HbA1c POC (<> result, manual entry)     HbA1c, POC (prediabetic range)     HbA1c, POC (controlled diabetic range)        Assessment & Plan:   Problem List Items Addressed This Visit     Morbid obesity with BMI of 50.0-59.9, adult (HCC)   OSA on CPAP   Type 2 diabetes mellitus with other specified complication (HCC) - Primary   Relevant Orders   POCT HgB A1C (Completed)   Other Visit Diagnoses       Long-term current use of injectable noninsulin antidiabetic medication             Type 2 diabetes mellitus Improved glycemic control with decreased Hemoglobin A1c from 8.5% to 6.6%. Current regimen includes Trulicity , effective for glucose but not appetite control. - Increase Trulicity  dose to 1.5 mg for appetite and weight control. - On  Metformin  500 BID - Coordinate with clinical pharmacy for  updating PAP dose inc on Trulicity   Morbid Obesity BMI >50 Recent weight gain over ten pounds, current weight 284 pounds. Increased appetite and frequent snacking, limited physical activity. - Increase Trulicity  from 0.75 to 1.5 dose for appetite management. - Advise reducing high-calorie snacks like potato chips.      OSA on CPAP Well controlled, chronic OSA on CPAP, - Good adherence to CPAP nightly - Continue current CPAP therapy, patient seems to be benefiting from therapy   Contact Sharyle Sia [facility] CPP to dose increase 1.5mg  weekly Trulicity   Orders Placed This Encounter  Procedures   POCT HgB A1C    No orders of the defined types were placed in this encounter.   Follow up plan: Return for 3 month fasting lab > 1 week later Annual Physical.  Future labs ordered for 07/27/24   Marsa Officer, DO [facility] 04/25/2024, 3:21 PM

## 2024-04-25 NOTE — Patient Instructions (Addendum)
 Thank you for coming to the office today.  We will try to increase dose of Trulicity  1.5mg , previous dose was 0.75mg   This will help with hunger  Recent Labs    06/15/23 0819 12/28/23 1324 04/25/24 1503  HGBA1C 8.5* 7.8* 6.6*    Please schedule a Follow-up Appointment to: Return for 3 month fasting lab > 1 week later Annual Physical.  If you have any other questions or concerns, please feel free to call the office or send a message through MyChart. You may also schedule an earlier appointment if necessary.  Additionally, you may be receiving a survey about your experience at our office within a few days to 1 week by e-mail or mail. We value your feedback.  Marsa Officer, DO [facility], [place]

## 2024-04-27 ENCOUNTER — Telehealth: Payer: Self-pay | Admitting: Pharmacist

## 2024-04-27 ENCOUNTER — Other Ambulatory Visit: Payer: Self-pay | Admitting: Family Medicine

## 2024-04-27 DIAGNOSIS — Z7985 Long-term (current) use of injectable non-insulin antidiabetic drugs: Secondary | ICD-10-CM

## 2024-04-27 DIAGNOSIS — E1169 Type 2 diabetes mellitus with other specified complication: Secondary | ICD-10-CM

## 2024-04-27 DIAGNOSIS — F411 Generalized anxiety disorder: Secondary | ICD-10-CM

## 2024-04-27 DIAGNOSIS — K219 Gastro-esophageal reflux disease without esophagitis: Secondary | ICD-10-CM

## 2024-04-27 MED ORDER — TRULICITY 1.5 MG/0.5ML ~~LOC~~ SOAJ: 1.5000 mg | SUBCUTANEOUS | 0 refills | Status: AC

## 2024-04-27 NOTE — Telephone Encounter (Signed)
 Requested Prescriptions  Pending Prescriptions Disp Refills   omeprazole  (PRILOSEC) 40 MG capsule [Pharmacy Med Name: OMEPRAZOLE  DR 40 MG CAPSULE] 90 capsule 0    Sig: TAKE 1 CAPSULE (40 MG TOTAL) BY MOUTH DAILY.     Gastroenterology: Proton Pump Inhibitors Passed - 04/27/2024  3:44 PM      Passed - Valid encounter within last 12 months    Recent Outpatient Visits           2 days ago Type 2 diabetes mellitus with other specified complication, without long-term current use of insulin [facility])   [place] [facility] [place], Marsa PARAS, DO   4 months ago Type 2 diabetes mellitus with other specified complication, without long-term current use of insulin [facility])   [place] [facility] [place], Marsa PARAS, DO               escitalopram  (LEXAPRO ) 20 MG tablet [Pharmacy Med Name: ESCITALOPRAM  20 MG TABLET] 90 tablet 0    Sig: TAKE 1 TABLET BY MOUTH EVERY DAY     Psychiatry:  Antidepressants - SSRI Passed - 04/27/2024  3:44 PM      Passed - Valid encounter within last 6 months    Recent Outpatient Visits           2 days ago Type 2 diabetes mellitus with other specified complication, without long-term current use of insulin [facility])   [place] [facility] [place], Marsa PARAS, DO   4 months ago Type 2 diabetes mellitus with other specified complication, without long-term current use of insulin [facility])   [place] [facility] [place], Marsa PARAS, [place]

## 2024-04-27 NOTE — Progress Notes (Signed)
 Receive message from PCP requesting support with increasing patient's Trulicity  dose to 1.5 mg weekly through the [employer] patient assistance program.   Send prescription for Trulicity  1.5 mg weekly to [name] (dispensing pharmacy for [facility]) today.  Will follow up with specialty pharmacy next week to determine when medication is expected to ship to patient.  Sharyle Sia, PharmD, [name], CPP Clinical Pharmacist [facility] [phone]

## 2024-04-30 ENCOUNTER — Ambulatory Visit: Payer: Self-pay | Admitting: *Deleted

## 2024-04-30 ENCOUNTER — Other Ambulatory Visit

## 2024-04-30 NOTE — Telephone Encounter (Signed)
 Copied from CRM #[id number]. Topic: Clinical - Red Word Triage >> Apr 30, 2024 11:07 AM Treva T wrote: [facility] that prompted transfer to Nurse Triage: Received call from patient, states she thinks she has a UTI, and need to be seen or prescribed antibiotics. Patient reports symptoms of burning with urination, and urinary frequency, vaginal irritation. Reason for Disposition  Age > 50 years  Answer Assessment - Initial Assessment Questions 1. SEVERITY: How bad is the pain?  (e.g., Scale 1-10; mild, moderate, or severe)     I'm having urinary burning and frequency.   I'm miserable.   I can't sleep.  Yes This has been going on for 2 days. 2. FREQUENCY: How many times have you had painful urination today?      Every time 3. PATTERN: Is pain present every time you urinate or just sometimes?      Yes 4. ONSET: When did the painful urination start?      2 days ago I'm having back pain too. 5. FEVER: Do you have a fever? If Yes, ask: What is your temperature, how was it measured, and when did it start?     No 6. PAST UTI: Have you had a urine infection before? If Yes, ask: When was the last time? and What happened that time?      I've had them before but not recently 7. CAUSE: What do you think is causing the painful urination?  (e.g., UTI, scratch, Herpes sore)     UTI 8. OTHER SYMPTOMS: Do you have any other symptoms? (e.g., blood in urine, flank pain, genital sores, urgency, vaginal discharge)     Vaginal irritation.   Flank pain across my lower back 9. PREGNANCY: Is there any chance you are pregnant? When was your last menstrual period?     N/A due to age  Protocols used: Urination Pain - Female-A-AH I offered pt an appt with another provider for 05/01/2024 in the morning but she is refusing.  No openings with Dr. Edman until Friday of this week.   She finally said,   I'll just go to a walk in clinic today then.   Again I offered to get her in in the morning  without success.         FYI Only or Action Required?: FYI only for provider.  Patient was last seen in primary care on 04/25/2024 by Edman Marsa PARAS, DO.  Called Nurse Triage reporting Urinary Frequency. Burning and bilateral flank pain for the last 2 days.  Symptoms began several days ago. 2 days ago  Interventions attempted: Nothing.  Symptoms are: gradually worsening.  Triage Disposition: See Physician Within 24 Hours  Patient/caregiver understands and will follow disposition?: Yes

## 2024-05-01 ENCOUNTER — Ambulatory Visit: Payer: Self-pay

## 2024-05-01 NOTE — Telephone Encounter (Signed)
 FYI Only or Action Required?: FYI only for provider.  Patient was last seen in primary care on 04/25/2024 by Edman Marsa PARAS, DO.  Called Nurse Triage reporting UTI symptoms.  Symptoms began several days ago.  Interventions attempted: Nothing.  Symptoms are: gradually worsening.  Triage Disposition: See Physician Within 24 Hours  Patient/caregiver understands and will follow disposition?: Yes  Copied from CRM #[id number]. Topic: Clinical - Red Word Triage >> May 01, 2024  9:19 AM Antwanette L wrote: Red Word that prompted transfer to Nurse Triage: Possible uti. Pt is experiencing pain/ frequent urination and back pain Reason for Disposition  Urinating more frequently than usual (i.e., frequency) OR new-onset of the feeling of an urgent need to urinate (i.e., urgency)  Answer Assessment - Initial Assessment Questions 1. SYMPTOM: What's the main symptom you're concerned about? (e.g., frequency, incontinence)     Frequent urination  2. ONSET: When did the  UTI symptoms  start?     4-5 days ago  3. PAIN: Is there any pain? If Yes, ask: How bad is it? (Scale: 1-10; mild, moderate, severe)     Yes, mild to moderate  4. CAUSE: What do you think is causing the symptoms?     Concerned for UTI  5. OTHER SYMPTOMS: Do you have any other symptoms? (e.g., blood in urine, fever, flank pain, pain with urination)     Flank pain  6. PREGNANCY: Is there any chance you are pregnant? When was your last menstrual period?     No  Protocols used: Urinary Symptoms-A-AH

## 2024-05-02 ENCOUNTER — Encounter: Payer: Self-pay | Admitting: Family Medicine

## 2024-05-02 ENCOUNTER — Ambulatory Visit (INDEPENDENT_AMBULATORY_CARE_PROVIDER_SITE_OTHER): Admitting: Family Medicine

## 2024-05-02 ENCOUNTER — Other Ambulatory Visit (INDEPENDENT_AMBULATORY_CARE_PROVIDER_SITE_OTHER): Admitting: Pharmacist

## 2024-05-02 VITALS — BP 122/78 | HR 85 | Ht 63.0 in | Wt 285.4 lb

## 2024-05-02 DIAGNOSIS — Z7985 Long-term (current) use of injectable non-insulin antidiabetic drugs: Secondary | ICD-10-CM

## 2024-05-02 DIAGNOSIS — R300 Dysuria: Secondary | ICD-10-CM

## 2024-05-02 DIAGNOSIS — E1169 Type 2 diabetes mellitus with other specified complication: Secondary | ICD-10-CM

## 2024-05-02 DIAGNOSIS — N3001 Acute cystitis with hematuria: Secondary | ICD-10-CM

## 2024-05-02 LAB — POCT URINALYSIS DIPSTICK
Bilirubin, UA: NEGATIVE
Glucose, UA: NEGATIVE
Ketones, UA: NEGATIVE
Nitrite, UA: NEGATIVE
Odor: POSITIVE
Protein, UA: NEGATIVE
Spec Grav, UA: 1.01 (ref 1.010–1.025)
Urobilinogen, UA: 0.2 U/dL
pH, UA: 5 (ref 5.0–8.0)

## 2024-05-02 MED ORDER — CEPHALEXIN 500 MG PO CAPS: 500.0000 mg | ORAL_CAPSULE | Freq: Three times a day (TID) | ORAL | 0 refills | Status: DC

## 2024-05-02 NOTE — Patient Instructions (Signed)
 Goals Addressed             This Visit's Progress    Pharmacy Goals       The goal A1c is less than 7%. This is the best way to reduce the risk of the long term complications of diabetes, including heart disease, kidney disease, eye disease, strokes, and nerve damage. An A1c of less than 7% corresponds with fasting sugars less than 130 and 2 hour after meal sugars less than 180.   Our goal bad cholesterol, or LDL, is less than 70 . This is why it is important to continue taking your atorvastatin .  Arthur Lash, PharmD, Becky Bowels, CPP Clinical Pharmacist [facility] [phone]

## 2024-05-02 NOTE — Progress Notes (Signed)
 Subjective:    Patient ID: Natasha Curry, female    DOB: [birth year], 78 y.o.   MRN: [id number]  Natasha Curry is a 78 y.o. female presenting on 05/02/2024 for Urinary Frequency  Patient presents for a same day appointment.   HPI  Discussed the use of AI scribe software for clinical note transcription with the patient, who gave verbal consent to proceed.  History of Present Illness   Natasha Curry is a 78 year old female with a prolapsed bladder who presents with symptoms of a urinary tract infection.  Acute UTI Lower urinary tract symptoms - Urinary frequency and discomfort, described as 'miserable' and often requiring bed rest - Rarely experiences urinary tract infections; surprised by current episode Admits some low back discomfort, she has chronic back pain but feels this is related to UTI  Pelvic organ prolapse - Known prolapsed bladder, which may increase risk of urinary tract infections          04/06/2024    9:37 AM 12/28/2023    1:27 PM 06/10/2023    2:28 PM  Depression screen PHQ 2/9  Decreased Interest 0 0 0  Down, Depressed, Hopeless 0 0 0  PHQ - 2 Score 0 0 0  Altered sleeping 0 1 0  Tired, decreased energy 0 1 2  Change in appetite 0 2 0  Feeling bad or failure about yourself  0 0 0  Trouble concentrating 0 0 0  Moving slowly or fidgety/restless 0 0 0  Suicidal thoughts 0 0 0  PHQ-9 Score 0 4 2  Difficult doing work/chores Not difficult at all Not difficult at all Not difficult at all       12/28/2023    1:27 PM 06/10/2023    2:28 PM 03/31/2022    9:53 AM 12/23/2021    8:28 AM  GAD 7 : Generalized Anxiety Score  Nervous, Anxious, on Edge 1 0 0 0  Control/stop worrying 0 0 0 0  Worry too much - different things 0 0 0 0  Trouble relaxing 0 0 0 0  Restless 0 0 0 0  Easily annoyed or irritable 0 0 0 0  Afraid - awful might happen 0 0 0 0  Total GAD 7 Score 1 0 0 0  Anxiety Difficulty Not difficult at all  Not difficult at all Not difficult at all     Social History   Tobacco Use   Smoking status: Never   Smokeless tobacco: Never  Vaping Use   Vaping status: Never Used  Substance Use Topics   Alcohol use: Yes    Alcohol/week: 3.0 standard drinks of alcohol    Types: 3 Standard drinks or equivalent per week   Drug use: Never    Review of Systems Per HPI unless specifically indicated above     Objective:    BP 122/78 (BP Location: Left Arm, Patient Position: Sitting, Cuff Size: Large)   Pulse 85   Ht 5' 3 (1.6 m)   Wt 285 lb 6 oz (129.4 kg)   SpO2 95%   BMI 50.55 kg/m   Wt Readings from Last 3 Encounters:  05/02/24 285 lb 6 oz (129.4 kg)  04/25/24 284 lb (128.8 kg)  01/05/24 276 lb 9.6 oz (125.5 kg)    Physical Exam Vitals and nursing note reviewed.  Constitutional:      General: She is not in acute distress.    Appearance: Normal appearance. She is well-developed. She is not diaphoretic.  Comments: Well-appearing, comfortable, cooperative  HENT:     Head: Normocephalic and atraumatic.  Eyes:     General:        Right eye: No discharge.        Left eye: No discharge.     Conjunctiva/sclera: Conjunctivae normal.  Cardiovascular:     Rate and Rhythm: Normal rate.  Pulmonary:     Effort: Pulmonary effort is normal.  Skin:    General: Skin is warm and dry.     Findings: No erythema or rash.  Neurological:     Mental Status: She is alert and oriented to person, place, and time.  Psychiatric:        Mood and Affect: Mood normal.        Behavior: Behavior normal.        Thought Content: Thought content normal.     Comments: Well groomed, good eye contact, normal speech and thoughts     Results for orders placed or performed in visit on 05/02/24  POCT Urinalysis Dipstick   Collection Time: 05/02/24  9:38 AM  Result Value Ref Range   Color, UA yelloe    Clarity, UA clear    Glucose, UA Negative Negative   Bilirubin, UA negative    Ketones, UA negative    Spec Grav, UA 1.010 1.010 - 1.025    Blood, UA trace    pH, UA 5.0 5.0 - 8.0   Protein, UA Negative Negative   Urobilinogen, UA 0.2 0.2 or 1.0 E.U./dL   Nitrite, UA negative    Leukocytes, UA Moderate (2+) (A) Negative   Appearance     Odor positive       Assessment & Plan:   Problem List Items Addressed This Visit   None Visit Diagnoses       Acute cystitis with hematuria    -  Primary   Relevant Medications   cephALEXin  (KEFLEX ) 500 MG capsule     Dysuria       Relevant Orders   POCT Urinalysis Dipstick (Completed)   Urine Culture        Urinary tract infection cystitis UTI with moderate leukocytes, inflammation, trace hematuria, likely kidney involvement causing significant discomfort. - Urine culture ordered - Prescribed Keflex  500 mg TID for 7 days. - [facility] - Advised to contact if symptoms persist or worsen.  Prolapsed bladder Prolapsed bladder increasing UTI risk. Prefers conservative management over surgery due to complexity and pain sensitivity. - Discussed prevention supplement for UTIs. - Monitor UTI frequency and reassess management if frequency increases.        Orders Placed This Encounter  Procedures   Urine Culture   POCT Urinalysis Dipstick    Meds ordered this encounter  Medications   cephALEXin  (KEFLEX ) 500 MG capsule    Sig: Take 1 capsule (500 mg total) by mouth 3 (three) times daily. For 7 days    Dispense:  21 capsule    Refill:  0    Follow up plan: Return if symptoms worsen or fail to improve.   Marsa Officer, DO [facility] 05/02/2024, 9:47 AM

## 2024-05-02 NOTE — Progress Notes (Signed)
 05/02/2024 Name: Natasha Curry MRN: [id number] DOB: [birth year]  Chief Complaint  Patient presents with   Medication Assistance   Medication Management    Natasha Curry is a 78 y.o. year old female who presented for a telephone visit.   They were referred to the pharmacist by their PCP for assistance in managing diabetes and medication access.    Outreach to [name] (dispensing pharmacy for [employer]) today on behalf of patient. Per [place] CPhT, Trulicity  1.5 mg strength shipped today to deliver to patient tomorrow.     Subjective:   Care Team: Primary Care Provider: Edman Marsa PARAS, DO ; Next Scheduled Visit: 08/06/2024 Pulmonologist: Kasa, Kurian, MD  Medication Access/Adherence  [facility]  [id number] [place] - [address] [place] [address] Phone: [phone] Fax: [phone]  [facility] - [place], [place] - [address] [place] [address] Phone: [phone] Fax: [phone]   Patient reports affordability concerns with their medications: No  Patient reports access/transportation concerns to their pharmacy: No  Patient reports adherence concerns with their medications:  No     Reports uses weekly pillbox  Note patient seen for Office Visit today with PCP related to UTI. Provider advised patient to: - Start Keflex  500mg  3 times daily for next 7 days, - Stay well hydrated - Urine culture sent    Diabetes:   Current medications:  - metformin  500 mg twice daily - Trulicity  0.75 mg weekly on Fridays             Reports tolerating well Plans to increase to Trulicity  1.5 mg weekly, as previously discussed with PCP, this Friday   Medications tried in the past: Rybelsus  (stomach upset), Ozempic (GI upset), Mounjaro  (cost)   Confirms picked up new glucometer and testing supplies. Recent morning fasting blood sugar readings ranging 100-110    Working on increasing portion sizes of non-starchy vegetables (steamed broccoli and spinach) avoiding sugary beverages    Statin therapy: atorvastatin  10 mg daily   Current Medication Access Support: enrolled in patient assistance for Trulicity  from [employer] through 09/05/2024   Objective:  Lab Results  Component Value Date   HGBA1C 6.6 (A) 04/25/2024    Lab Results  Component Value Date   CREATININE 0.85 06/15/2023   BUN 12 06/15/2023   NA 141 06/15/2023   K 4.4 06/15/2023   CL 105 06/15/2023   CO2 28 06/15/2023    Lab Results  Component Value Date   CHOL 144 06/15/2023   HDL 31 (L) 06/15/2023   LDLCALC 86 06/15/2023   TRIG 175 (H) 06/15/2023   CHOLHDL 4.6 06/15/2023    Current Outpatient Medications on File Prior to Visit  Medication Sig Dispense Refill   atorvastatin  (LIPITOR) 10 MG tablet Take 1 tablet (10 mg total) by mouth daily. 90 tablet 3   Blood Glucose Monitoring Suppl (ONETOUCH VERIO FLEX SYSTEM) w/Device KIT Use to check blood sugar up to twice daily as directed 1 kit 0   cyclobenzaprine  (FLEXERIL ) 10 MG tablet TAKE 1 TABLET BY MOUTH AT BEDTIME AS NEEDED FOR MUSCLE SPASMS 30 tablet 2   desoximetasone (TOPICORT) 0.25 % cream Apply 1 Application topically 2 (two) times daily.     Dulaglutide  (TRULICITY ) 1.5 MG/0.5ML SOAJ Inject 1.5 mg into the skin once a week. 8 mL 0   escitalopram  (LEXAPRO ) 20 MG tablet TAKE 1 TABLET BY MOUTH EVERY DAY 90 tablet 0  glucose blood (ONETOUCH VERIO) test strip Use to check blood sugar twice daily 200 each 3   Lancets (ONETOUCH DELICA PLUS LANCET30G) MISC Use to check blood sugar twice daily 200 each 3   metFORMIN  (GLUCOPHAGE ) 500 MG tablet Take 1 tablet (500 mg total) by mouth 2 (two) times daily with a meal. 180 tablet 3   metoprolol  succinate (TOPROL -XL) 25 MG 24 hr tablet Take 0.5 tablets (12.5 mg total) by mouth daily. 45 tablet 3   nystatin cream (MYCOSTATIN) APPLY A THIN LAYER TO AFFECTED AREA TWICE DAILY TO FOLDS      omeprazole  (PRILOSEC) 40 MG capsule TAKE 1 CAPSULE (40 MG TOTAL) BY MOUTH DAILY. 90 capsule 0   triamcinolone  cream (KENALOG ) 0.5 % Apply 1 Application topically 2 (two) times daily. To affected areas, for up to 2 weeks. 30 g 2   No current facility-administered medications on file prior to visit.       Assessment/Plan:   Have encouraged patient to continue to use weekly pillbox. Encouraged to use weekly phone alarm to aid with adherence to Trulicity    Today counsel patient on cephalexin  antibiotic, including importance of finishing entire course even if feeling better sooner. Encourage patient to stay well hydrated   Diabetes: - Currently uncontrolled - Reviewed long term cardiovascular and renal outcomes of uncontrolled blood sugar - Reviewed goal A1c, goal fasting, and goal 2 hour post prandial glucose - Have reviewed dietary modifications including importance of having regular well-balanced meals and snacks throughout the day, while controlling carbohydrate portion sizes - Plans to increase to Trulicity  1.5 mg weekly, as previously discussed with PCP, this Friday - Patient to follow up with [employer] patient assistance program as needed for refills of Trulicity  - Recommend to check glucose, keep log of results and have this record to review at upcoming medical appointments. Patient to contact provider office sooner if needed for readings outside of established parameters or symptoms      Follow Up Plan: Clinical Pharmacist will follow up with patient by telephone on 06/18/2024 at 2:00 PM    Sharyle Sia, PharmD, [name], CPP Clinical Pharmacist [facility] [phone]

## 2024-05-02 NOTE — Patient Instructions (Addendum)
Thank you for coming to the office today.  1. You have a Urinary Tract Infection - this is very common, your symptoms are reassuring and you should get better within 1 week on the antibiotics - Start Keflex 500mg 3 times daily for next 7 days, complete entire course, even if feeling better - We sent urine for a culture, we will call you within next few days if we need to change antibiotics - Please drink plenty of fluids, improve hydration over next 1 week  If symptoms worsening, developing nausea / vomiting, worsening back pain, fevers / chills / sweats, then please return for re-evaluation sooner.  If you take AZO OTC - limit this to 2-3 days MAX to avoid affecting kidneys  D-Mannose is a natural supplement that can actually help bind to urinary bacteria and reduce their effectiveness it can help prevent UTI from forming, and may reduce some symptoms. It likely cannot cure an active UTI but it is worth a try and good to prevent them with. Try 500mg twice a day at a full dose if you want, or check package instructions for more info   Please schedule a Follow-up Appointment to: Return if symptoms worsen or fail to improve.  If you have any other questions or concerns, please feel free to call the office or send a message through MyChart. You may also schedule an earlier appointment if necessary.  Additionally, you may be receiving a survey about your experience at our office within a few days to 1 week by e-mail or mail. We value your feedback.  Nikoleta Dady, DO [facility] 

## 2024-05-03 LAB — URINE CULTURE
MICRO NUMBER:: 16891773
Result:: NO GROWTH
SPECIMEN QUALITY:: ADEQUATE

## 2024-05-04 ENCOUNTER — Ambulatory Visit: Payer: Self-pay | Admitting: Family Medicine

## 2024-05-22 DIAGNOSIS — G4733 Obstructive sleep apnea (adult) (pediatric): Secondary | ICD-10-CM | POA: Diagnosis not present

## 2024-05-24 DIAGNOSIS — G4733 Obstructive sleep apnea (adult) (pediatric): Secondary | ICD-10-CM | POA: Diagnosis not present

## 2024-06-18 ENCOUNTER — Other Ambulatory Visit: Admitting: Pharmacist

## 2024-06-18 ENCOUNTER — Telehealth: Payer: Self-pay

## 2024-06-18 DIAGNOSIS — Z7985 Long-term (current) use of injectable non-insulin antidiabetic drugs: Secondary | ICD-10-CM

## 2024-06-18 DIAGNOSIS — E1169 Type 2 diabetes mellitus with other specified complication: Secondary | ICD-10-CM

## 2024-06-18 NOTE — Progress Notes (Signed)
 06/18/2024 Name: Natasha Curry MRN: [id number] DOB: April 18, 1946  Chief Complaint  Patient presents with   Medication Assistance   Medication Management    Natasha Curry is a 78 y.o. year old female who presented for a telephone visit.   They were referred to the pharmacist by their PCP for assistance in managing diabetes and medication access.    Subjective:  Care Team: Primary Care Provider: Edman Marsa PARAS, DO ; Next Scheduled Visit: 08/06/2024 Pulmonologist: Kasa, Kurian, MD  Medication Access/Adherence  [facility]  [id number] [place] - [address] [place] [address] Phone: [phone] Fax: [phone]  [facility] - [place], [place] - [address] [place] [address] Phone: [phone] Fax: [phone]   Patient reports affordability concerns with their medications: No  Patient reports access/transportation concerns to their pharmacy: No  Patient reports adherence concerns with their medications:  No     Reports uses weekly pillbox     Diabetes:   Current medications:  - metformin  500 mg twice daily - Trulicity  1.5 mg weekly on Fridays (increased to current dose ~1 month ago)             Reports tolerating well; reports noticing appetite benefit  Medications tried in the past: Rybelsus  (stomach upset), Ozempic (GI upset), Mounjaro  (cost)   Confirms picked up new glucometer and testing supplies. Recent morning fasting blood sugar readings ranging 80-102   Reports maintaining increased portion sizes of non-starchy vegetables (steamed broccoli and spinach) and avoiding sugary beverages (drinking water instead)   Statin therapy: atorvastatin  10 mg daily  Current Physical Activity: reports limited to movement around the home, but plans to increase walking as recommended by PCP   Current Medication Access Support: enrolled in patient assistance for Trulicity   from [employer] through 09/05/2024   Objective:  Lab Results  Component Value Date   HGBA1C 6.6 (A) 04/25/2024    Lab Results  Component Value Date   CREATININE 0.85 06/15/2023   BUN 12 06/15/2023   NA 141 06/15/2023   K 4.4 06/15/2023   CL 105 06/15/2023   CO2 28 06/15/2023    Lab Results  Component Value Date   CHOL 144 06/15/2023   HDL 31 (L) 06/15/2023   LDLCALC 86 06/15/2023   TRIG 175 (H) 06/15/2023   CHOLHDL 4.6 06/15/2023    Medications Reviewed Today     Reviewed by Alana Sharyle LABOR, RPH-CPP (Pharmacist) on 06/18/24 at 1416  Med List Status: <None>   Medication Order Taking? Sig Documenting Provider Last Dose Status Informant  atorvastatin  (LIPITOR) 10 MG tablet [id number]  Take 1 tablet (10 mg total) by mouth daily. Edman Marsa PARAS, DO  Active   Blood Glucose Monitoring Suppl (ONETOUCH VERIO FLEX SYSTEM) w/Device KIT [id number]  Use to check blood sugar up to twice daily as directed Edman Marsa PARAS, DO  Active     Discontinued 06/18/24 1403 (Completed Course)   cyclobenzaprine  (FLEXERIL ) 10 MG tablet [id number]  TAKE 1 TABLET BY MOUTH AT BEDTIME AS NEEDED FOR MUSCLE SPASMS Karamalegos, Marsa PARAS, DO  Active   desoximetasone (TOPICORT) 0.25 % cream [id number]  Apply 1 Application topically 2 (two) times daily. [provider]  Active   Dulaglutide  (TRULICITY ) 1.5 MG/0.5ML EMMANUEL [id number] Yes Inject 1.5 mg into the skin once a week. Edman Marsa PARAS, DO  Active   escitalopram  (LEXAPRO ) 20 MG tablet [id number]  TAKE 1 TABLET BY  MOUTH EVERY DAY Edman Marsa PARAS, DO  Active   glucose blood (ONETOUCH VERIO) test strip [id number]  Use to check blood sugar twice daily Edman Marsa PARAS, DO  Active   Lancets [facility] [employer] CATHRYNE PLUS [place]) MISC [id number]  Use to check blood sugar twice daily Edman Marsa PARAS, DO  Active   metFORMIN  (GLUCOPHAGE ) 500 MG tablet [id number] Yes Take 1 tablet (500 mg total) by mouth 2 (two)  times daily with a meal. Edman, Marsa PARAS, DO  Active   metoprolol  succinate (TOPROL -XL) 25 MG 24 hr tablet [id number]  Take 0.5 tablets (12.5 mg total) by mouth daily. Karamalegos, Marsa PARAS, DO  Active   nystatin cream (MYCOSTATIN) [id number]  APPLY A THIN LAYER TO AFFECTED AREA TWICE DAILY TO FOLDS [provider]  Active   omeprazole  (PRILOSEC) 40 MG capsule [id number]  TAKE 1 CAPSULE (40 MG TOTAL) BY MOUTH DAILY. Edman Marsa PARAS, DO  Active   triamcinolone  cream (KENALOG ) 0.5 % [id number]  Apply 1 Application topically 2 (two) times daily. To affected areas, for up to 2 weeks. Edman Marsa PARAS, DO  Active               Assessment/Plan:   Have encouraged patient to continue to use weekly pillbox. Encouraged to use weekly phone alarm to aid with adherence to Trulicity      Diabetes: - Currently uncontrolled - Reviewed long term cardiovascular and renal outcomes of uncontrolled blood sugar - Reviewed goal A1c, goal fasting, and goal 2 hour post prandial glucose - Have reviewed dietary modifications including importance of having regular well-balanced meals and snacks throughout the day, while controlling carbohydrate portion sizes - Patient to follow up with [employer] patient assistance program as needed for refills of Trulicity  - Encourage to gradually increase walking as recommended by PCP - Recommend to check glucose, keep log of results and have this record to review at upcoming medical appointments. Patient to contact provider office sooner if needed for readings outside of established parameters or symptoms - Will collaborate with CPhT [place] to request support to patient with applying for re-enrollment for Trulicity  patient assistance for 2026 calendar year       Follow Up Plan: Clinical Pharmacist will follow up with patient by telephone on 08/20/2024 at 2:00 PM    Sharyle Sia, PharmD, [name], CPP Clinical Pharmacist [facility] [phone]

## 2024-06-18 NOTE — Telephone Encounter (Signed)
 PAP: Patient assistance application for Trulicity through [employer] has been mailed to pt's home address on file. Provider portion of application will be faxed to provider's office.

## 2024-06-18 NOTE — Patient Instructions (Signed)
 Goals Addressed             This Visit's Progress    Pharmacy Goals       The goal A1c is less than 7%. This is the best way to reduce the risk of the long term complications of diabetes, including heart disease, kidney disease, eye disease, strokes, and nerve damage. An A1c of less than 7% corresponds with fasting sugars less than 130 and 2 hour after meal sugars less than 180.   Our goal bad cholesterol, or LDL, is less than 70 . This is why it is important to continue taking your atorvastatin .  Arthur Lash, PharmD, Becky Bowels, CPP Clinical Pharmacist [facility] [phone]

## 2024-06-21 NOTE — Progress Notes (Signed)
 Natasha Curry                                          MRN: [id number]   06/21/2024   The [facility] Quality Team Specialist reviewed this patient medical record for the purposes of chart review for care gap closure. The following were reviewed: chart review for care gap closure-kidney health evaluation for diabetes:eGFR  and uACR.    [facility]I Quality Team

## 2024-06-23 DIAGNOSIS — G4733 Obstructive sleep apnea (adult) (pediatric): Secondary | ICD-10-CM | POA: Diagnosis not present

## 2024-07-03 NOTE — Telephone Encounter (Signed)
 Received patient portion Pap application Trulicity  ([employer])

## 2024-07-09 ENCOUNTER — Ambulatory Visit: Payer: Self-pay

## 2024-07-09 ENCOUNTER — Encounter: Payer: Self-pay | Admitting: Internal Medicine

## 2024-07-09 ENCOUNTER — Ambulatory Visit (INDEPENDENT_AMBULATORY_CARE_PROVIDER_SITE_OTHER): Admitting: Internal Medicine

## 2024-07-09 VITALS — BP 128/78 | Ht 63.0 in | Wt 272.4 lb

## 2024-07-09 DIAGNOSIS — M542 Cervicalgia: Secondary | ICD-10-CM | POA: Diagnosis not present

## 2024-07-09 MED ORDER — NAPROXEN 500 MG PO TABS
500.0000 mg | ORAL_TABLET | Freq: Two times a day (BID) | ORAL | 0 refills | Status: DC
Start: 2024-07-09 — End: 2024-07-12

## 2024-07-09 NOTE — Telephone Encounter (Signed)
 Pt disconnected prior to transfer to this RN. VM left requesting call-back.   Copied from CRM [id number]. Topic: Clinical - Red Word Triage >> Jul 09, 2024  8:32 AM Lonell PEDLAR wrote: Red Word that prompted transfer to Nurse Triage: stabbing, unbearable pain from right ear to shoulder. No help from Motrin

## 2024-07-09 NOTE — Telephone Encounter (Signed)
 FYI Only or Action Required?: FYI only for provider: appointment scheduled on 07/09/2024.  Patient was last seen in primary care on 05/02/2024 by Edman Marsa PARAS, DO.  Called Nurse Triage reporting Pain.  Symptoms began a week ago.  Interventions attempted: OTC medications: Motrin.  Triage Disposition: See HCP Within 4 Hours (Or PCP Triage)  Patient/caregiver understands and will follow disposition?: Yes         Reason for Disposition  [1] SEVERE pain (e.g., excruciating) and [2] not improved 2 hours after pain medicine (e.g., acetaminophen  or ibuprofen)  Answer Assessment - Initial Assessment Questions Pt has an appointment today in office.   Right ear pain that radiates to right shoulder Onset: 1 week ago Pt is taking Motrin   SEVERITY: How bad is the pain?  (Scale 1-10; mild, moderate or severe)     Stabbing pain  Protocols used: Earache-A-AH

## 2024-07-09 NOTE — Patient Instructions (Signed)
 Neck Exercises Ask your health care provider which exercises are safe for you. Do exercises exactly as told by your health care provider and adjust them as directed. It is normal to feel mild stretching, pulling, tightness, or discomfort as you do these exercises. Stop right away if you feel sudden pain or your pain gets worse. Do not begin these exercises until told by your health care provider. Neck exercises can be important for many reasons. They can improve strength and maintain flexibility in your neck, which will help your upper back and prevent neck pain. Stretching exercises Rotation neck stretching  Sit in a chair or stand up. Place your feet flat on the floor, shoulder-width apart. Slowly turn your head (rotate) to the right until a slight stretch is felt. Turn it all the way to the right so you can look over your right shoulder. Do not tilt or tip your head. Hold this position for 10-30 seconds. Slowly turn your head (rotate) to the left until a slight stretch is felt. Turn it all the way to the left so you can look over your left shoulder. Do not tilt or tip your head. Hold this position for 10-30 seconds. Repeat __________ times. Complete this exercise __________ times a day. Neck retraction  Sit in a sturdy chair or stand up. Look straight ahead. Do not bend your neck. Use your fingers to push your chin backward (retraction). Do not bend your neck for this movement. Continue to face straight ahead. If you are doing the exercise properly, you will feel a slight sensation in your throat and a stretch at the back of your neck. Hold the stretch for 1-2 seconds. Repeat __________ times. Complete this exercise __________ times a day. Strengthening exercises Neck press  Lie on your back on a firm bed or on the floor with a pillow under your head. Use your neck muscles to push your head down on the pillow and straighten your spine. Hold the position as well as you can. Keep your head  facing up (in a neutral position) and your chin tucked. Slowly count to 5 while holding this position. Repeat __________ times. Complete this exercise __________ times a day. Isometrics These are exercises in which you strengthen the muscles in your neck while keeping your neck still (isometrics). Sit in a supportive chair and place your hand on your forehead. Keep your head and face facing straight ahead. Do not flex or extend your neck while doing isometrics. Push forward with your head and neck while pushing back with your hand. Hold for 10 seconds. Do the sequence again, this time putting your hand against the back of your head. Use your head and neck to push backward against the hand pressure. Finally, do the same exercise on either side of your head, pushing sideways against the pressure of your hand. Repeat __________ times. Complete this exercise __________ times a day. Prone head lifts  Lie face-down (prone position), resting on your elbows so that your chest and upper back are raised. Start with your head facing downward, near your chest. Position your chin either on or near your chest. Slowly lift your head upward. Lift until you are looking straight ahead. Then continue lifting your head as far back as you can comfortably stretch. Hold your head up for 5 seconds. Then slowly lower it to your starting position. Repeat __________ times. Complete this exercise __________ times a day. Supine head lifts  Lie on your back (supine position), bending your knees  to point to the ceiling and keeping your feet flat on the floor. Lift your head slowly off the floor, raising your chin toward your chest. Hold for 5 seconds. Repeat __________ times. Complete this exercise __________ times a day. Scapular retraction  Stand with your arms at your sides. Look straight ahead. Slowly pull both shoulders (scapulae) backward and downward (retraction) until you feel a stretch between your shoulder  blades in your upper back. Hold for 10-30 seconds. Relax and repeat. Repeat __________ times. Complete this exercise __________ times a day. Contact a health care provider if: Your neck pain or discomfort gets worse when you do an exercise. Your neck pain or discomfort does not improve within 2 hours after you exercise. If you have any of these problems, stop exercising right away. Do not do the exercises again unless your health care provider says that you can. Get help right away if: You develop sudden, severe neck pain. If this happens, stop exercising right away. Do not do the exercises again unless your health care provider says that you can. This information is not intended to replace advice given to you by your health care provider. Make sure you discuss any questions you have with your health care provider. Document Revised: 02/17/2021 Document Reviewed: 02/17/2021 Elsevier Patient Education  2024 [employer].

## 2024-07-09 NOTE — Telephone Encounter (Signed)
 This RN made second attempt to contact patient with no answer. A voicemail was left with call back number provided.

## 2024-07-09 NOTE — Progress Notes (Signed)
 Subjective:    Patient ID: Natasha Curry, female    DOB: 1945/10/12, 78 y.o.   MRN: [id number]  HPI  Discussed the use of AI scribe software for clinical note transcription with the patient, who gave verbal consent to proceed.  Natasha Curry is a 78 year old female who presents with severe ear and neck pain.  She has been experiencing severe, stabbing pain that began a week ago in her right ear and radiates down to her neck. The pain is constant but somewhat alleviated by taking Motrin and lying down with her head on a pillow. She describes the pain as 'over the chart' and states that it forces her to lie down for relief.  There is no ringing in the ear or drainage, although she has a history of eczema in her ear canals for which she uses drops. She does not recall any specific event that triggered the pain and is unsure if it started upon waking. The pain is exacerbated by any movement, making it difficult for her to sit in a chair without it returning.  No headache, runny nose, nasal congestion, sore throat, or cough. She has not experienced similar symptoms in the past and initially thought it might be a pinched nerve. The pain does not radiate into her arm.  She has been using heat and massage, which provide temporary relaxation but do not alleviate the pain when it is present.       Review of Systems   Past Medical History:  Diagnosis Date   Allergy    Anxiety    Cancer (HCC)    basil cell   Colon polyp    Depression    Eczema    GERD (gastroesophageal reflux disease)    Kidney stones    Psoriasis    Sleep apnea    Tremor     Current Outpatient Medications  Medication Sig Dispense Refill   atorvastatin  (LIPITOR) 10 MG tablet Take 1 tablet (10 mg total) by mouth daily. 90 tablet 3   Blood Glucose Monitoring Suppl (ONETOUCH VERIO FLEX SYSTEM) w/Device KIT Use to check blood sugar up to twice daily as directed 1 kit 0   cyclobenzaprine  (FLEXERIL ) 10 MG tablet TAKE 1  TABLET BY MOUTH AT BEDTIME AS NEEDED FOR MUSCLE SPASMS 30 tablet 2   desoximetasone (TOPICORT) 0.25 % cream Apply 1 Application topically 2 (two) times daily.     Dulaglutide  (TRULICITY ) 1.5 MG/0.5ML SOAJ Inject 1.5 mg into the skin once a week. 8 mL 0   escitalopram  (LEXAPRO ) 20 MG tablet TAKE 1 TABLET BY MOUTH EVERY DAY 90 tablet 0   glucose blood (ONETOUCH VERIO) test strip Use to check blood sugar twice daily 200 each 3   Lancets (ONETOUCH DELICA PLUS LANCET30G) MISC Use to check blood sugar twice daily 200 each 3   metFORMIN  (GLUCOPHAGE ) 500 MG tablet Take 1 tablet (500 mg total) by mouth 2 (two) times daily with a meal. 180 tablet 3   metoprolol  succinate (TOPROL -XL) 25 MG 24 hr tablet Take 0.5 tablets (12.5 mg total) by mouth daily. 45 tablet 3   nystatin cream (MYCOSTATIN) APPLY A THIN LAYER TO AFFECTED AREA TWICE DAILY TO FOLDS     omeprazole  (PRILOSEC) 40 MG capsule TAKE 1 CAPSULE (40 MG TOTAL) BY MOUTH DAILY. 90 capsule 0   triamcinolone  cream (KENALOG ) 0.5 % Apply 1 Application topically 2 (two) times daily. To affected areas, for up to 2 weeks. 30 g 2   No current  facility-administered medications for this visit.    No Known Allergies  Family History  Problem Relation Age of Onset   Cancer Mother        adrenal   Heart attack Father    Heart disease Father    Stroke Father    Breast cancer Neg Hx     Social History   Socioeconomic History   Marital status: Married    Spouse name: Not on file   Number of children: Not on file   Years of education: Not on file   Highest education level: Not on file  Occupational History   Not on file  Tobacco Use   Smoking status: Never   Smokeless tobacco: Never  Vaping Use   Vaping status: Never Used  Substance and Sexual Activity   Alcohol use: Yes    Alcohol/week: 3.0 standard drinks of alcohol    Types: 3 Standard drinks or equivalent per week   Drug use: Never   Sexual activity: Not on file  Other Topics Concern   Not  on file  Social History Narrative   Not on file   Social Drivers of Health   Financial Resource Strain: Low Risk  (04/06/2024)   Overall Financial Resource Strain (CARDIA)    Difficulty of Paying Living Expenses: Not hard at all  Food Insecurity: No Food Insecurity (04/06/2024)   Hunger Vital Sign    Worried About Running Out of Food in the Last Year: Never true    Ran Out of Food in the Last Year: Never true  Transportation Needs: No Transportation Needs (04/06/2024)   PRAPARE - Administrator, Civil Service (Medical): No    Lack of Transportation (Non-Medical): No  Physical Activity: Inactive (04/06/2024)   Exercise Vital Sign    Days of Exercise per Week: 0 days    Minutes of Exercise per Session: 0 min  Stress: No Stress Concern Present (04/06/2024)   [employer] of Occupational Health - Occupational Stress Questionnaire    Feeling of Stress: Not at all  Social Connections: Moderately Isolated (04/06/2024)   Social Connection and Isolation Panel    Frequency of Communication with Friends and Family: More than three times a week    Frequency of Social Gatherings with Friends and Family: Never    Attends Religious Services: Never    Database Administrator or Organizations: No    Attends Banker Meetings: Never    Marital Status: Married  Catering Manager Violence: Not At Risk (04/06/2024)   Humiliation, Afraid, Rape, and Kick questionnaire    Fear of Current or Ex-Partner: No    Emotionally Abused: No    Physically Abused: No    Sexually Abused: No     Constitutional: Denies fever, malaise, fatigue, headache or abrupt weight changes.  HEENT: Patient reports right-sided ear pain.  Denies eye pain, eye redness, ringing in the ears, wax buildup, runny nose, nasal congestion, bloody nose, or sore throat. Respiratory: Denies difficulty breathing, shortness of breath, cough or sputum production.   Cardiovascular: Denies chest pain, chest tightness, palpitations  or swelling in the hands or feet.  Musculoskeletal: Patient reports right sided neck pain.  Denies decrease in range of motion, difficulty with gait, or joint pain and swelling.   Neurological: Denies dizziness, numbness, tingling, weakness of the right upper extremity.    No other specific complaints in a complete review of systems (except as listed in HPI above).      Objective:  Physical Exam BP 128/78 (BP Location: Left Arm, Patient Position: Sitting, Cuff Size: Large)   Ht 5' 3 (1.6 m)   Wt 272 lb 6.4 oz (123.6 kg)   BMI 48.25 kg/m   Wt Readings from Last 3 Encounters:  05/02/24 285 lb 6 oz (129.4 kg)  04/25/24 284 lb (128.8 kg)  01/05/24 276 lb 9.6 oz (125.5 kg)    General: Appears her stated age, Natasha Curry, in NAD. Skin: Warm, dry and intact. No rashes noted. HEENT: Head: normal shape and size; Eyes: sclera white, no icterus, conjunctiva pink, PERRLA and EOMs intact; Ears: Tm's gray and intact, normal light reflex, mild eczema noted of the right external ear;  Neck: No adenopathy noted. Cardiovascular: Normal rate and rhythm.  Pulmonary/Chest: Normal effort and positive vesicular breath sounds. No respiratory distress. No wheezes, rales or ronchi noted.  Musculoskeletal: Normal flexion, extension and rotation of the cervical spine.  No pain with palpation over the cervical spine.  Pain with palpation of the right paracervical muscles.  Shoulder shrug is equal. Neurological: Alert and oriented.    BMET    Component Value Date/Time   NA 141 06/15/2023 0819   K 4.4 06/15/2023 0819   CL 105 06/15/2023 0819   CO2 28 06/15/2023 0819   GLUCOSE 156 (H) 06/15/2023 0819   BUN 12 06/15/2023 0819   CREATININE 0.85 06/15/2023 0819   CALCIUM  8.8 06/15/2023 0819   GFRNONAA 77 02/10/2021 1121   GFRAA 89 02/10/2021 1121    Lipid Panel     Component Value Date/Time   CHOL 144 06/15/2023 0819   TRIG 175 (H) 06/15/2023 0819   HDL 31 (L) 06/15/2023 0819   CHOLHDL 4.6 06/15/2023  0819   LDLCALC 86 06/15/2023 0819    CBC    Component Value Date/Time   WBC 5.9 06/15/2023 0819   RBC 4.03 06/15/2023 0819   HGB 11.5 (L) 06/15/2023 0819   HCT 36.7 06/15/2023 0819   PLT 237 06/15/2023 0819   MCV 91.1 06/15/2023 0819   MCH 28.5 06/15/2023 0819   MCHC 31.3 (L) 06/15/2023 0819   RDW 13.2 06/15/2023 0819   LYMPHSABS 2,454 06/15/2023 0819   EOSABS 112 06/15/2023 0819   BASOSABS 30 06/15/2023 0819    Hgb A1C Lab Results  Component Value Date   HGBA1C 6.6 (A) 04/25/2024            Assessment & Plan:  Assessment and Plan    Acute severe neck pain Likely muscle strain due to muscle tenderness and absence of nerve involvement. - Prescribed naproxen 500 mg twice daily for five days, consume with food. - Recommended cyclobenzaprine  10 mg at bedtime (already has this prescription on file). - Advised against concurrent ibuprofen use with prescribed anti-inflammatory. - Encouraged heat massage   Follow-up with your PCP as previously scheduled Angeline Laura, NP

## 2024-07-11 ENCOUNTER — Other Ambulatory Visit (HOSPITAL_COMMUNITY): Payer: Self-pay

## 2024-07-11 NOTE — Telephone Encounter (Signed)
 PAP: Application for Trulicity has been submitted to [employer], via fax

## 2024-07-12 ENCOUNTER — Other Ambulatory Visit: Payer: Self-pay | Admitting: Family Medicine

## 2024-07-12 DIAGNOSIS — M542 Cervicalgia: Secondary | ICD-10-CM

## 2024-07-12 NOTE — Telephone Encounter (Unsigned)
 Copied from CRM [id number]. Topic: Clinical - Medication Refill >> Jul 12, 2024  9:57 AM Darshell M wrote: Medication: naproxen (NAPROSYN) 500 MG tablet - Patient would like more than 10 tablets if possible.  Has the patient contacted their pharmacy? No - No refills on medication (Agent: If no, request that the patient contact the pharmacy for the refill. If patient does not wish to contact the pharmacy document the reason why and proceed with request.) (Agent: If yes, when and what did the pharmacy advise?)  This is the patient's preferred pharmacy:  [id number] - [place], [place] - [address] [place] [address] Phone: [phone] Fax: [phone]   Is this the correct pharmacy for this prescription? Yes If no, delete pharmacy and type the correct one.   Has the prescription been filled recently? Yes  Is the patient out of the medication? Yes  Has the patient been seen for an appointment in the last year OR does the patient have an upcoming appointment? Yes  Can we respond through MyChart? Yes  Agent: Please be advised that Rx refills may take up to 3 business days. We ask that you follow-up with your pharmacy.

## 2024-07-13 ENCOUNTER — Ambulatory Visit: Payer: Self-pay

## 2024-07-13 ENCOUNTER — Other Ambulatory Visit: Payer: Self-pay | Admitting: Internal Medicine

## 2024-07-13 MED ORDER — NAPROXEN 500 MG PO TABS: 500.0000 mg | ORAL_TABLET | Freq: Two times a day (BID) | ORAL | 0 refills | Status: AC

## 2024-07-13 NOTE — Addendum Note (Signed)
 Addended by: ZELIA GAUZE D on: 07/13/2024 01:21 PM   Modules accepted: Orders

## 2024-07-13 NOTE — Telephone Encounter (Signed)
 FYI Only or Action Required?: Action required by provider: medication refill request.  Patient was last seen in primary care on 07/09/2024 by Antonette Angeline ORN, NP.  Called Nurse Triage reporting Medication Refill.  Symptoms began  .  Interventions attempted: Prescription medications: Naproxyn and Rest, hydration, or home remedies.  Symptoms are: gradually improving.  Triage Disposition: Call PCP When Office is Open  Patient/caregiver understands and will follow disposition?: No, wishes to speak with PCP   Copied from CRM #[id number]. Topic: Clinical - Red Word Triage >> Jul 13, 2024 12:52 PM Lonell PEDLAR wrote: Red Word that prompted transfer to Nurse Triage: Patient experiencing pain from her ear to her neck, like a pinched nerve. Patient is out of pain medication that was helping her. Reason for Disposition  [1] Prescription refill request for NON-ESSENTIAL medicine (i.e., no harm to patient if med not taken) AND [2] triager unable to refill per department policy  Answer Assessment - Initial Assessment Questions Additional info:  Patient was evaluated on 07/09/24 for severe neck pain, she was prescribed naproxen BID x 5 days. She states she received #10 pills and is in need of more, she called yesterday for refill (see refill encounter) and upset it is not at the pharmacy. She states naproxen is working well and she is afraid to go without for the weekend and end up back in severe pain. Reviewed medication instruction as noted in visit notes with patient:  Prescribed naproxen 500 mg twice daily for five days, consume with food. She states the bottle reads Take 1 tablet (500 mg total) by mouth 2 (two) times daily with a meal. For 2-4 weeks then as needed. She is upset she only received 5 days worth and would like the prescription filled for 2-4 weeks. Please follow up with patient if refill not authorized.   1. DRUG NAME: What medicine do you need to have refilled?     Naproxen  2. REFILLS  REMAINING: How many refills are remaining? Notes: The label on the medicine or pill bottle will show how many refills are remaining. If there are no refills remaining, then a renewal may be needed.     Zero  3. EXPIRATION DATE: What is the expiration date? Note: The label states when the prescription will expire, and thus can no longer be refilled.)      4. PRESCRIBER: Who prescribed it? Note: The prescribing doctor or group is responsible for refill approvals.SABRA Angeline Baity-acute visit on 07/09/24 5. PHARMACY: Have you contacted your pharmacy (drugstore)? Note: Some pharmacies will contact the doctor (or NP/PA).      Yes, no order received  6. SYMPTOMS: Do you have any symptoms?     Neck pain is managed on Naproxen. No new symptoms.  Protocols used: Medication Refill and Renewal Call-A-AH

## 2024-07-13 NOTE — Telephone Encounter (Signed)
 Requested medication (s) are due for refill today- unsure  Requested medication (s) are on the active medication list -yes  Future visit scheduled -yes  Last refill: 07/09/24 #10  Notes to clinic: Acute visit Rx- sent for review of RF request  Requested Prescriptions  Pending Prescriptions Disp Refills   naproxen (NAPROSYN) 500 MG tablet 10 tablet 0    Sig: Take 1 tablet (500 mg total) by mouth 2 (two) times daily with a meal. For 2-4 weeks then as needed     Analgesics:  NSAIDS Failed - 07/13/2024  1:51 PM      Failed - Manual Review: Labs are only required if the patient has taken medication for more than 8 weeks.      Failed - Cr in normal range and within 360 days    Creat  Date Value Ref Range Status  06/15/2023 0.85 0.60 - 1.00 mg/dL Final   Creatinine, Urine  Date Value Ref Range Status  06/15/2023 151 20 - 275 mg/dL Final         Failed - HGB in normal range and within 360 days    Hemoglobin  Date Value Ref Range Status  06/15/2023 11.5 (L) 11.7 - 15.5 g/dL Final         Failed - PLT in normal range and within 360 days    Platelets  Date Value Ref Range Status  06/15/2023 237 140 - 400 Thousand/uL Final         Failed - HCT in normal range and within 360 days    HCT  Date Value Ref Range Status  06/15/2023 36.7 35.0 - 45.0 % Final         Failed - eGFR is 30 or above and within 360 days    GFR, Est African American  Date Value Ref Range Status  02/10/2021 89 > OR = 60 mL/min/1.72m2 Final   GFR, Est Non African American  Date Value Ref Range Status  02/10/2021 77 > OR = 60 mL/min/1.60m2 Final   eGFR  Date Value Ref Range Status  06/15/2023 70 > OR = 60 mL/min/1.63m2 Final         Passed - Patient is not pregnant      Passed - Valid encounter within last 12 months    Recent Outpatient Visits           4 days ago Neck pain on right side   [place] [facility] [place], [place], NP   2 months ago Acute cystitis with hematuria    [place] [facility] [place] Marsa PARAS, DO   2 months ago Type 2 diabetes mellitus with other specified complication, without long-term current use of insulin (HCC)   [place] [facility] [place], Marsa PARAS, DO   6 months ago Type 2 diabetes mellitus with other specified complication, without long-term current use of insulin (HCC)   [place] [facility] [place], Marsa PARAS, DO                 Requested Prescriptions  Pending Prescriptions Disp Refills   naproxen (NAPROSYN) 500 MG tablet 10 tablet 0    Sig: Take 1 tablet (500 mg total) by mouth 2 (two) times daily with a meal. For 2-4 weeks then as needed     Analgesics:  NSAIDS Failed - 07/13/2024  1:51 PM      Failed - Manual Review: Labs are only required if the patient  has taken medication for more than 8 weeks.      Failed - Cr in normal range and within 360 days    Creat  Date Value Ref Range Status  06/15/2023 0.85 0.60 - 1.00 mg/dL Final   Creatinine, Urine  Date Value Ref Range Status  06/15/2023 151 20 - 275 mg/dL Final         Failed - HGB in normal range and within 360 days    Hemoglobin  Date Value Ref Range Status  06/15/2023 11.5 (L) 11.7 - 15.5 g/dL Final         Failed - PLT in normal range and within 360 days    Platelets  Date Value Ref Range Status  06/15/2023 237 140 - 400 Thousand/uL Final         Failed - HCT in normal range and within 360 days    HCT  Date Value Ref Range Status  06/15/2023 36.7 35.0 - 45.0 % Final         Failed - eGFR is 30 or above and within 360 days    GFR, Est African American  Date Value Ref Range Status  02/10/2021 89 > OR = 60 mL/min/1.6m2 Final   GFR, Est Non African American  Date Value Ref Range Status  02/10/2021 77 > OR = 60 mL/min/1.63m2 Final   eGFR  Date Value Ref Range Status  06/15/2023 70 > OR = 60 mL/min/1.58m2 Final         Passed - Patient is not pregnant       Passed - Valid encounter within last 12 months    Recent Outpatient Visits           4 days ago Neck pain on right side   [place] [facility] [place], [place], NP   2 months ago Acute cystitis with hematuria   [place] [facility] [place], Marsa PARAS, DO   2 months ago Type 2 diabetes mellitus with other specified complication, without long-term current use of insulin [facility])   [place] [facility] [place], Marsa PARAS, DO   6 months ago Type 2 diabetes mellitus with other specified complication, without long-term current use of insulin [facility])    [facility] [place], Marsa PARAS, [place]

## 2024-07-16 NOTE — Telephone Encounter (Signed)
 Duplicate request.  Requested Prescriptions  Pending Prescriptions Disp Refills   naproxen (NAPROSYN) 500 MG tablet [Pharmacy Med Name: NAPROXEN 500 MG TABLET] 10 tablet 0    Sig: TAKE 1 TABLET (500 MG TOTAL) BY MOUTH 2 (TWO) TIMES DAILY WITH A MEAL. FOR 2-4 WEEKS THEN AS NEEDED     Analgesics:  NSAIDS Failed - 07/16/2024 12:37 PM      Failed - Manual Review: Labs are only required if the patient has taken medication for more than 8 weeks.      Failed - Cr in normal range and within 360 days    Creat  Date Value Ref Range Status  06/15/2023 0.85 0.60 - 1.00 mg/dL Final   Creatinine, Urine  Date Value Ref Range Status  06/15/2023 151 20 - 275 mg/dL Final         Failed - HGB in normal range and within 360 days    Hemoglobin  Date Value Ref Range Status  06/15/2023 11.5 (L) 11.7 - 15.5 g/dL Final         Failed - PLT in normal range and within 360 days    Platelets  Date Value Ref Range Status  06/15/2023 237 140 - 400 Thousand/uL Final         Failed - HCT in normal range and within 360 days    HCT  Date Value Ref Range Status  06/15/2023 36.7 35.0 - 45.0 % Final         Failed - eGFR is 30 or above and within 360 days    GFR, Est African American  Date Value Ref Range Status  02/10/2021 89 > OR = 60 mL/min/1.68m2 Final   GFR, Est Non African American  Date Value Ref Range Status  02/10/2021 77 > OR = 60 mL/min/1.65m2 Final   eGFR  Date Value Ref Range Status  06/15/2023 70 > OR = 60 mL/min/1.60m2 Final         Passed - Patient is not pregnant      Passed - Valid encounter within last 12 months    Recent Outpatient Visits           1 week ago Neck pain on right side   [place] [facility] [place], [place] W, NP   2 months ago Acute cystitis with hematuria   [place] [facility] [place], Marsa PARAS, DO   2 months ago Type 2 diabetes mellitus with other specified complication, without long-term current use of insulin  [facility])   [place] [facility] [place], Marsa PARAS, DO   6 months ago Type 2 diabetes mellitus with other specified complication, without long-term current use of insulin [facility])   [place] [facility] [place], Marsa PARAS, [place]

## 2024-07-16 NOTE — Telephone Encounter (Signed)
 PAP: Patient assistance application for Trulicity  has been approved by PAP Companies: [employer] from 09/06/2024 to 09/05/2025. Medication should be delivered to PAP Delivery: Home. For further shipping updates, please contact [employer] at [phone]. Patient ID is: APPROVAL LETTER IN MEDIA

## 2024-07-22 ENCOUNTER — Other Ambulatory Visit: Payer: Self-pay | Admitting: Family Medicine

## 2024-07-22 DIAGNOSIS — E1169 Type 2 diabetes mellitus with other specified complication: Secondary | ICD-10-CM

## 2024-07-24 NOTE — Telephone Encounter (Signed)
 Requested medication (s) are due for refill today - yes  Requested medication (s) are on the active medication list -yes  Future visit scheduled -yes  Last refill: 06/10/23 #45 [id number]  Notes to clinic: fails lab protocol- over 1 year-06/15/23  Requested Prescriptions  Pending Prescriptions Disp Refills   metFORMIN  (GLUCOPHAGE ) 500 MG tablet [Pharmacy Med Name: METFORMIN  HCL 500 MG TABLET] 180 tablet 3    Sig: TAKE 1 TABLET BY MOUTH 2 TIMES DAILY WITH A MEAL.     Endocrinology:  Diabetes - Biguanides Failed - 07/24/2024  1:47 PM      Failed - Cr in normal range and within 360 days    Creat  Date Value Ref Range Status  06/15/2023 0.85 0.60 - 1.00 mg/dL Final   Creatinine, Urine  Date Value Ref Range Status  06/15/2023 151 20 - 275 mg/dL Final         Failed - eGFR in normal range and within 360 days    GFR, Est African American  Date Value Ref Range Status  02/10/2021 89 > OR = 60 mL/min/1.42m2 Final   GFR, Est Non African American  Date Value Ref Range Status  02/10/2021 77 > OR = 60 mL/min/1.23m2 Final   eGFR  Date Value Ref Range Status  06/15/2023 70 > OR = 60 mL/min/1.61m2 Final         Failed - B12 Level in normal range and within 720 days    No results found for: VITAMINB12       Failed - CBC within normal limits and completed in the last 12 months    WBC  Date Value Ref Range Status  06/15/2023 5.9 3.8 - 10.8 Thousand/uL Final   RBC  Date Value Ref Range Status  06/15/2023 4.03 3.80 - 5.10 Million/uL Final   Hemoglobin  Date Value Ref Range Status  06/15/2023 11.5 (L) 11.7 - 15.5 g/dL Final   HCT  Date Value Ref Range Status  06/15/2023 36.7 35.0 - 45.0 % Final   MCHC  Date Value Ref Range Status  06/15/2023 31.3 (L) 32.0 - 36.0 g/dL Final    Comment:    For adults, a slight decrease in the calculated MCHC value (in the range of 30 to 32 g/dL) is most likely not clinically significant; however, it should be interpreted with caution in  correlation with other red cell parameters and the patient's clinical condition.    MCH  Date Value Ref Range Status  06/15/2023 28.5 27.0 - 33.0 pg Final   MCV  Date Value Ref Range Status  06/15/2023 91.1 80.0 - 100.0 fL Final   No results found for: PLTCOUNTKUC, LABPLAT, POCPLA RDW  Date Value Ref Range Status  06/15/2023 13.2 11.0 - 15.0 % Final         Passed - HBA1C is between 0 and 7.9 and within 180 days    Hemoglobin A1C  Date Value Ref Range Status  04/25/2024 6.6 (A) 4.0 - 5.6 % Final   Hgb A1c MFr Bld  Date Value Ref Range Status  06/15/2023 8.5 (H) <5.7 % of total Hgb Final    Comment:    For someone without known diabetes, a hemoglobin A1c value of 6.5% or greater indicates that they may have  diabetes and this should be confirmed with a follow-up  test. . For someone with known diabetes, a value <7% indicates  that their diabetes is well controlled and a value  greater than or equal to 7% indicates  suboptimal  control. A1c targets should be individualized based on  duration of diabetes, age, comorbid conditions, and  other considerations. . Currently, no consensus exists regarding use of hemoglobin A1c for diagnosis of diabetes for children. SABRA Amy - Valid encounter within last 6 months    Recent Outpatient Visits           2 weeks ago Neck pain on right side   [place] [facility] [place], [place] W, NP   2 months ago Acute cystitis with hematuria   [place] [facility] [place], Marsa PARAS, DO   3 months ago Type 2 diabetes mellitus with other specified complication, without long-term current use of insulin [facility] [place])   [place] [facility] [place], Marsa PARAS, DO   6 months ago Type 2 diabetes mellitus with other specified complication, without long-term current use of insulin [facility])   [place] [facility] Marsa PARAS, DO                  Requested Prescriptions  Pending Prescriptions Disp Refills   metFORMIN  (GLUCOPHAGE ) 500 MG tablet [Pharmacy Med Name: METFORMIN  HCL 500 MG TABLET] 180 tablet 3    Sig: TAKE 1 TABLET BY MOUTH 2 TIMES DAILY WITH A MEAL.     Endocrinology:  Diabetes - Biguanides Failed - 07/24/2024  1:47 PM      Failed - Cr in normal range and within 360 days    Creat  Date Value Ref Range Status  06/15/2023 0.85 0.60 - 1.00 mg/dL Final   Creatinine, Urine  Date Value Ref Range Status  06/15/2023 151 20 - 275 mg/dL Final         Failed - eGFR in normal range and within 360 days    GFR, Est African American  Date Value Ref Range Status  02/10/2021 89 > OR = 60 mL/min/1.52m2 Final   GFR, Est Non African American  Date Value Ref Range Status  02/10/2021 77 > OR = 60 mL/min/1.32m2 Final   eGFR  Date Value Ref Range Status  06/15/2023 70 > OR = 60 mL/min/1.19m2 Final         Failed - B12 Level in normal range and within 720 days    No results found for: VITAMINB12       Failed - CBC within normal limits and completed in the last 12 months    WBC  Date Value Ref Range Status  06/15/2023 5.9 3.8 - 10.8 Thousand/uL Final   RBC  Date Value Ref Range Status  06/15/2023 4.03 3.80 - 5.10 Million/uL Final   Hemoglobin  Date Value Ref Range Status  06/15/2023 11.5 (L) 11.7 - 15.5 g/dL Final   HCT  Date Value Ref Range Status  06/15/2023 36.7 35.0 - 45.0 % Final   MCHC  Date Value Ref Range Status  06/15/2023 31.3 (L) 32.0 - 36.0 g/dL Final    Comment:    For adults, a slight decrease in the calculated MCHC value (in the range of 30 to 32 g/dL) is most likely not clinically significant; however, it should be interpreted with caution in correlation with other red cell parameters and the patient's clinical condition.    [facility]  Date Value Ref Range Status  06/15/2023 28.5 27.0 - 33.0 pg Final   MCV  Date Value Ref Range Status  06/15/2023 91.1 80.0 - 100.0 fL Final  No  results found for: PLTCOUNTKUC, LABPLAT, POCPLA RDW  Date Value Ref Range Status  06/15/2023 13.2 11.0 - 15.0 % Final         Passed - HBA1C is between 0 and 7.9 and within 180 days    Hemoglobin A1C  Date Value Ref Range Status  04/25/2024 6.6 (A) 4.0 - 5.6 % Final   Hgb A1c MFr Bld  Date Value Ref Range Status  06/15/2023 8.5 (H) <5.7 % of total Hgb Final    Comment:    For someone without known diabetes, a hemoglobin A1c value of 6.5% or greater indicates that they may have  diabetes and this should be confirmed with a follow-up  test. . For someone with known diabetes, a value <7% indicates  that their diabetes is well controlled and a value  greater than or equal to 7% indicates suboptimal  control. A1c targets should be individualized based on  duration of diabetes, age, comorbid conditions, and  other considerations. . Currently, no consensus exists regarding use of hemoglobin A1c for diagnosis of diabetes for children. SABRA Amy - Valid encounter within last 6 months    Recent Outpatient Visits           2 weeks ago Neck pain on right side   [place] [facility] [place], [place] W, NP   2 months ago Acute cystitis with hematuria   [place] [facility] [place], Marsa PARAS, DO   3 months ago Type 2 diabetes mellitus with other specified complication, without long-term current use of insulin [facility])   [place] [facility] [place], Marsa PARAS, DO   6 months ago Type 2 diabetes mellitus with other specified complication, without long-term current use of insulin [facility])   [place] [facility] [place], Marsa PARAS, [place]

## 2024-07-27 ENCOUNTER — Other Ambulatory Visit

## 2024-07-27 ENCOUNTER — Other Ambulatory Visit: Payer: Self-pay | Admitting: Family Medicine

## 2024-07-27 DIAGNOSIS — I10 Essential (primary) hypertension: Secondary | ICD-10-CM

## 2024-07-27 DIAGNOSIS — E1169 Type 2 diabetes mellitus with other specified complication: Secondary | ICD-10-CM | POA: Diagnosis not present

## 2024-07-27 DIAGNOSIS — F411 Generalized anxiety disorder: Secondary | ICD-10-CM

## 2024-07-27 DIAGNOSIS — Z0000 Encounter for general adult medical examination without abnormal findings: Secondary | ICD-10-CM

## 2024-07-27 DIAGNOSIS — K219 Gastro-esophageal reflux disease without esophagitis: Secondary | ICD-10-CM

## 2024-07-27 DIAGNOSIS — E782 Mixed hyperlipidemia: Secondary | ICD-10-CM

## 2024-07-28 LAB — COMPREHENSIVE METABOLIC PANEL WITH GFR
AG Ratio: 1.6 (calc) (ref 1.0–2.5)
ALT: 9 U/L (ref 6–29)
AST: 14 U/L (ref 10–35)
Albumin: 3.8 g/dL (ref 3.6–5.1)
Alkaline phosphatase (APISO): 109 U/L (ref 37–153)
BUN: 14 mg/dL (ref 7–25)
CO2: 25 mmol/L (ref 20–32)
Calcium: 8.9 mg/dL (ref 8.6–10.4)
Chloride: 107 mmol/L (ref 98–110)
Creat: 0.87 mg/dL (ref 0.60–1.00)
Globulin: 2.4 g/dL (ref 1.9–3.7)
Glucose, Bld: 99 mg/dL (ref 65–99)
Potassium: 3.9 mmol/L (ref 3.5–5.3)
Sodium: 142 mmol/L (ref 135–146)
Total Bilirubin: 0.6 mg/dL (ref 0.2–1.2)
Total Protein: 6.2 g/dL (ref 6.1–8.1)
eGFR: 68 mL/min/1.73m2 (ref >=60)

## 2024-07-28 LAB — CBC WITH DIFFERENTIAL/PLATELET
Absolute Lymphocytes: 2611 {cells}/uL (ref 850–3900)
Absolute Monocytes: 500 {cells}/uL (ref 200–950)
Basophils Absolute: 61 {cells}/uL (ref 0–200)
Basophils Relative: 1 %
Eosinophils Absolute: 122 {cells}/uL (ref 15–500)
Eosinophils Relative: 2 %
HCT: 37.4 % (ref 35.0–45.0)
Hemoglobin: 12.1 g/dL (ref 11.7–15.5)
MCH: 28.8 pg (ref 27.0–33.0)
MCHC: 32.4 g/dL (ref 32.0–36.0)
MCV: 89 fL (ref 80.0–100.0)
MPV: 10.8 fL (ref 7.5–12.5)
Monocytes Relative: 8.2 %
Neutro Abs: 2806 {cells}/uL (ref 1500–7800)
Neutrophils Relative %: 46 %
Platelets: 246 10*3/uL (ref 140–400)
RBC: 4.2 Million/uL (ref 3.80–5.10)
RDW: 13.5 % (ref 11.0–15.0)
Total Lymphocyte: 42.8 %
WBC: 6.1 10*3/uL (ref 3.8–10.8)

## 2024-07-28 LAB — LIPID PANEL
Cholesterol: 135 mg/dL
HDL: 31 mg/dL — ABNORMAL LOW
LDL Cholesterol (Calc): 79 mg/dL
Non-HDL Cholesterol (Calc): 104 mg/dL
Total CHOL/HDL Ratio: 4.4 (calc)
Triglycerides: 146 mg/dL

## 2024-07-28 LAB — HEMOGLOBIN A1C
Hgb A1c MFr Bld: 6 % — ABNORMAL HIGH
Mean Plasma Glucose: 126 mg/dL
eAG (mmol/L): 7 mmol/L

## 2024-07-28 LAB — TSH: TSH: 3.19 m[IU]/L (ref 0.40–4.50)

## 2024-07-28 LAB — MICROALBUMIN / CREATININE URINE RATIO
Creatinine, Urine: 351 mg/dL — ABNORMAL HIGH (ref 20–275)
Microalb Creat Ratio: 6 mg/g{creat}
Microalb, Ur: 2.2 mg/dL

## 2024-08-06 ENCOUNTER — Encounter: Payer: Self-pay | Admitting: Family Medicine

## 2024-08-06 ENCOUNTER — Ambulatory Visit: Admitting: Family Medicine

## 2024-08-06 VITALS — BP 138/84 | HR 79 | Ht 63.0 in | Wt 270.0 lb

## 2024-08-06 DIAGNOSIS — Z7985 Long-term (current) use of injectable non-insulin antidiabetic drugs: Secondary | ICD-10-CM

## 2024-08-06 DIAGNOSIS — G4733 Obstructive sleep apnea (adult) (pediatric): Secondary | ICD-10-CM

## 2024-08-06 DIAGNOSIS — Z23 Encounter for immunization: Secondary | ICD-10-CM | POA: Diagnosis not present

## 2024-08-06 DIAGNOSIS — F411 Generalized anxiety disorder: Secondary | ICD-10-CM

## 2024-08-06 DIAGNOSIS — E1169 Type 2 diabetes mellitus with other specified complication: Secondary | ICD-10-CM | POA: Diagnosis not present

## 2024-08-06 DIAGNOSIS — K219 Gastro-esophageal reflux disease without esophagitis: Secondary | ICD-10-CM | POA: Diagnosis not present

## 2024-08-06 DIAGNOSIS — Z0000 Encounter for general adult medical examination without abnormal findings: Secondary | ICD-10-CM | POA: Diagnosis not present

## 2024-08-06 DIAGNOSIS — I10 Essential (primary) hypertension: Secondary | ICD-10-CM

## 2024-08-06 DIAGNOSIS — E782 Mixed hyperlipidemia: Secondary | ICD-10-CM | POA: Diagnosis not present

## 2024-08-06 MED ORDER — OMEPRAZOLE 40 MG PO CPDR: 40.0000 mg | DELAYED_RELEASE_CAPSULE | Freq: Every day | ORAL | 3 refills | Status: AC

## 2024-08-06 MED ORDER — ATORVASTATIN CALCIUM 10 MG PO TABS: 10.0000 mg | ORAL_TABLET | Freq: Every day | ORAL | 3 refills | Status: AC

## 2024-08-06 MED ORDER — ESCITALOPRAM OXALATE 20 MG PO TABS: 20.0000 mg | ORAL_TABLET | Freq: Every day | ORAL | 3 refills | Status: AC

## 2024-08-06 NOTE — Patient Instructions (Addendum)
 Thank you for coming to the office today.  Med refilled  Lab results look great!  Recent Labs    12/28/23 1324 04/25/24 1503 07/27/24 0803  HGBA1C 7.8* 6.6* 6.0*   Keep on Trulicity   Schedule the nurse visit in January for the Prevnar-20 pneumonia vaccine  Flu Shot today already done.  Please schedule a Follow-up Appointment to: Return in about 6 months (around 02/04/2025) for 6 month DM A1c.  If you have any other questions or concerns, please feel free to call the office or send a message through MyChart. You may also schedule an earlier appointment if necessary.  Additionally, you may be receiving a survey about your experience at our office within a few days to 1 week by e-mail or mail. We value your feedback.  Marsa Officer, DO [facility], [place]

## 2024-08-06 NOTE — Progress Notes (Signed)
 Subjective:    Patient ID: Natasha Curry, female    DOB: [birth year], 78 y.o.   MRN: [id number]  Natasha Curry is a 78 y.o. female presenting on 08/06/2024 for Annual Exam   HPI  Discussed the use of AI scribe software for clinical note transcription with the patient, who gave verbal consent to proceed.  History of Present Illness   Natasha Curry is a 78 year old female with diabetes and hyperlipidemia who presents with fatigue.  Fatigue - Significant fatigue persists despite well-controlled diabetes and hyperlipidemia. - Sleep is well-regulated with restful nights and daytime naps, but exhaustion remains. - Possible hereditary component, as her father experienced similar fatigue.  Hyperlipidemia - LDL cholesterol level is 79, indicating well-controlled hyperlipidemia. - Atorvastatin  is used for lipid management.  Pelvic organ prolapse - Experiencing issues with prolapse. - Desires consultation with a female physician for this condition after the holidays due to dissatisfaction with previous female provider. - In future, not ready  Morbid Obesity BMI >47 Weight gain and hyperphagia 285 lbs down to 270 lbs Improved appetite on GLP1   - Daytime fatigue with ability to sleep for two hours during the day - Uses CPAP machine at night for sleep apnea, which improves nocturnal sleep quality - Attributes fatigue to body weight, stating 'when you're fat all you want to do is rest and sleep'      Chronic Back Pain Improved on muscle relaxants, takes Robaxin  during day and Flexeril  at night due to sedation. Tolerating well.   OSA, on CPAP - Patient reports prior history of dx OSA and on CPAP - Today reports that sleep apnea is well controlled. She uses the CPAP machine every night. Tolerates the machine well, and thinks that sleeps better with it and feels good. No new concerns or symptoms.   CHRONIC DM, Type 2: - Diabetes is well-managed with a recent hemoglobin A1c of 6.0, the best in  four years. - Current medication regimen includes Trulicity  and metformin . - Weight loss of 15 pounds, from 285 to 270 pounds, attributed to improved glycemic control. Meds: Trulicity  1.5mg  weekly inj, Metformin  500mg  TWICE A DAY Reports good compliance. Tolerating well w/o side-effects Denies hypoglycemia, polyuria, visual changes, numbness or tingling.     Health Maintenance: Flu  Shot Return for Prevnar-20     07/09/2024    1:46 PM 04/06/2024    9:37 AM 12/28/2023    1:27 PM  Depression screen PHQ 2/9  Decreased Interest 0 0 0  Down, Depressed, Hopeless 1 0 0  PHQ - 2 Score 1 0 0  Altered sleeping 2 0 1  Tired, decreased energy 1 0 1  Change in appetite 1 0 2  Feeling bad or failure about yourself  0 0 0  Trouble concentrating 0 0 0  Moving slowly or fidgety/restless 0 0 0  Suicidal thoughts 0 0 0  PHQ-9 Score 5  0  4   Difficult doing work/chores Not difficult at all Not difficult at all Not difficult at all     Data saved with a previous flowsheet row definition       07/09/2024    1:46 PM 12/28/2023    1:27 PM 06/10/2023    2:28 PM 03/31/2022    9:53 AM  GAD 7 : Generalized Anxiety Score  Nervous, Anxious, on Edge 0 1 0 0  Control/stop worrying 0 0 0 0  Worry too much - different things 0 0 0 0  Trouble relaxing 0 0  0 0  Restless 0 0 0 0  Easily annoyed or irritable 2 0 0 0  Afraid - awful might happen 0 0 0 0  Total GAD 7 Score 2 1 0 0  Anxiety Difficulty Not difficult at all Not difficult at all  Not difficult at all     Past Medical History:  Diagnosis Date   Allergy    Anxiety    Cancer (HCC)    basil cell   Colon polyp    Depression    Eczema    GERD (gastroesophageal reflux disease)    Kidney stones    Psoriasis    Sleep apnea    Tremor    Past Surgical History:  Procedure Laterality Date   CHOLECYSTECTOMY  2000   GASTRIC BYPASS  2013   TOTAL KNEE ARTHROPLASTY  2014   Social History   Socioeconomic History   Marital status: Married     Spouse name: Not on file   Number of children: Not on file   Years of education: Not on file   Highest education level: Not on file  Occupational History   Not on file  Tobacco Use   Smoking status: Never   Smokeless tobacco: Never  Vaping Use   Vaping status: Never Used  Substance and Sexual Activity   Alcohol use: Yes    Alcohol/week: 3.0 standard drinks of alcohol    Types: 3 Standard drinks or equivalent per week   Drug use: Never   Sexual activity: Not on file  Other Topics Concern   Not on file  Social History Narrative   Not on file   Social Drivers of Health   Financial Resource Strain: Low Risk  (04/06/2024)   Overall Financial Resource Strain (CARDIA)    Difficulty of Paying Living Expenses: Not hard at all  Food Insecurity: No Food Insecurity (04/06/2024)   Hunger Vital Sign    Worried About Running Out of Food in the Last Year: Never true    Ran Out of Food in the Last Year: Never true  Transportation Needs: No Transportation Needs (04/06/2024)   PRAPARE - Administrator, Civil Service (Medical): No    Lack of Transportation (Non-Medical): No  Physical Activity: Inactive (04/06/2024)   Exercise Vital Sign    Days of Exercise per Week: 0 days    Minutes of Exercise per Session: 0 min  Stress: No Stress Concern Present (04/06/2024)   [employer] of Occupational Health - Occupational Stress Questionnaire    Feeling of Stress: Not at all  Social Connections: Moderately Isolated (04/06/2024)   Social Connection and Isolation Panel    Frequency of Communication with Friends and Family: More than three times a week    Frequency of Social Gatherings with Friends and Family: Never    Attends Religious Services: Never    Database Administrator or Organizations: No    Attends Banker Meetings: Never    Marital Status: Married  Catering Manager Violence: Not At Risk (04/06/2024)   Humiliation, Afraid, Rape, and Kick questionnaire    Fear of Current  or Ex-Partner: No    Emotionally Abused: No    Physically Abused: No    Sexually Abused: No   Family History  Problem Relation Age of Onset   Cancer Mother        adrenal   Heart attack Father    Heart disease Father    Stroke Father    Breast cancer Neg  Hx    Current Outpatient Medications on File Prior to Visit  Medication Sig   Blood Glucose Monitoring Suppl (ONETOUCH VERIO FLEX SYSTEM) w/Device KIT Use to check blood sugar up to twice daily as directed   cyclobenzaprine  (FLEXERIL ) 10 MG tablet TAKE 1 TABLET BY MOUTH AT BEDTIME AS NEEDED FOR MUSCLE SPASMS   desoximetasone (TOPICORT) 0.25 % cream Apply 1 Application topically 2 (two) times daily.   Dulaglutide  (TRULICITY ) 1.5 MG/0.5ML SOAJ Inject 1.5 mg into the skin once a week.   glucose blood (ONETOUCH VERIO) test strip Use to check blood sugar twice daily   Lancets (ONETOUCH DELICA PLUS LANCET30G) MISC Use to check blood sugar twice daily   metFORMIN  (GLUCOPHAGE ) 500 MG tablet TAKE 1 TABLET BY MOUTH 2 TIMES DAILY WITH A MEAL.   metoprolol  succinate (TOPROL -XL) 25 MG 24 hr tablet TAKE 1/2 TABLET BY MOUTH EVERY DAY   naproxen  (NAPROSYN ) 500 MG tablet Take 1 tablet (500 mg total) by mouth 2 (two) times daily with a meal. For 1-2 weeks then as needed   nystatin cream (MYCOSTATIN) APPLY A THIN LAYER TO AFFECTED AREA TWICE DAILY TO FOLDS   triamcinolone  cream (KENALOG ) 0.5 % Apply 1 Application topically 2 (two) times daily. To affected areas, for up to 2 weeks.   No current facility-administered medications on file prior to visit.    Review of Systems  Constitutional:  Negative for activity change, appetite change, chills, diaphoresis, fatigue and fever.  HENT:  Negative for congestion and hearing loss.   Eyes:  Negative for visual disturbance.  Respiratory:  Negative for cough, chest tightness, shortness of breath and wheezing.   Cardiovascular:  Negative for chest pain, palpitations and leg swelling.  Gastrointestinal:   Negative for abdominal pain, constipation, diarrhea, nausea and vomiting.  Genitourinary:  Negative for dysuria, frequency and hematuria.  Musculoskeletal:  Negative for arthralgias and neck pain.  Skin:  Negative for rash.  Neurological:  Negative for dizziness, weakness, light-headedness, numbness and headaches.  Hematological:  Negative for adenopathy.  Psychiatric/Behavioral:  Negative for behavioral problems, dysphoric mood and sleep disturbance.    Per HPI unless specifically indicated above     Objective:    BP 138/84 (BP Location: Left Arm, Cuff Size: Normal)   Pulse 79   Ht 5' 3 (1.6 m)   Wt 270 lb (122.5 kg)   SpO2 98%   BMI 47.83 kg/m   Wt Readings from Last 3 Encounters:  08/06/24 270 lb (122.5 kg)  07/09/24 272 lb 6.4 oz (123.6 kg)  05/02/24 285 lb 6 oz (129.4 kg)    Physical Exam Vitals and nursing note reviewed.  Constitutional:      General: She is not in acute distress.    Appearance: She is well-developed. She is not diaphoretic.     Comments: Well-appearing, comfortable, cooperative  HENT:     Head: Normocephalic and atraumatic.  Eyes:     General:        Right eye: No discharge.        Left eye: No discharge.     Conjunctiva/sclera: Conjunctivae normal.     Pupils: Pupils are equal, round, and reactive to light.  Neck:     Thyroid: No thyromegaly.  Cardiovascular:     Rate and Rhythm: Normal rate and regular rhythm.     Pulses: Normal pulses.     Heart sounds: Normal heart sounds. No murmur heard. Pulmonary:     Effort: Pulmonary effort is normal. No respiratory distress.  Breath sounds: Normal breath sounds. No wheezing or rales.  Abdominal:     General: Bowel sounds are normal. There is no distension.     Palpations: Abdomen is soft. There is no mass.     Tenderness: There is no abdominal tenderness.  Musculoskeletal:        General: No tenderness. Normal range of motion.     Cervical back: Normal range of motion and neck supple.      Comments: Upper / Lower Extremities: - Normal muscle tone, strength bilateral upper extremities 5/5, lower extremities 5/5  Lymphadenopathy:     Cervical: No cervical adenopathy.  Skin:    General: Skin is warm and dry.     Findings: No erythema or rash.  Neurological:     Mental Status: She is alert and oriented to person, place, and time.     Comments: Distal sensation intact to light touch all extremities  Psychiatric:        Mood and Affect: Mood normal.        Behavior: Behavior normal.        Thought Content: Thought content normal.     Comments: Well groomed, good eye contact, normal speech and thoughts     Diabetic Foot Exam - Simple   Simple Foot Form Diabetic Foot exam was performed with the following findings: Yes 08/06/2024  1:50 PM  Visual Inspection No deformities, no ulcerations, no other skin breakdown bilaterally: Yes Sensation Testing Intact to touch and monofilament testing bilaterally: Yes Pulse Check Posterior Tibialis and Dorsalis pulse intact bilaterally: Yes Comments      Results for orders placed or performed in visit on 07/27/24  Comprehensive metabolic panel with GFR   Collection Time: 07/27/24  8:03 AM  Result Value Ref Range   Glucose, Bld 99 65 - 99 mg/dL   BUN 14 7 - 25 mg/dL   Creat 9.12 9.39 - 8.99 mg/dL   eGFR 68 > OR = 60 fO/fpw/8.26f7   BUN/Creatinine Ratio SEE NOTE: 6 - 22 (calc)   Sodium 142 135 - 146 mmol/L   Potassium 3.9 3.5 - 5.3 mmol/L   Chloride 107 98 - 110 mmol/L   CO2 25 20 - 32 mmol/L   Calcium  8.9 8.6 - 10.4 mg/dL   Total Protein 6.2 6.1 - 8.1 g/dL   Albumin 3.8 3.6 - 5.1 g/dL   Globulin 2.4 1.9 - 3.7 g/dL (calc)   AG Ratio 1.6 1.0 - 2.5 (calc)   Total Bilirubin 0.6 0.2 - 1.2 mg/dL   Alkaline phosphatase (APISO) 109 37 - 153 U/L   AST 14 10 - 35 U/L   ALT 9 6 - 29 U/L  TSH   Collection Time: 07/27/24  8:03 AM  Result Value Ref Range   TSH 3.19 0.40 - 4.50 mIU/L  Microalbumin / creatinine urine ratio    Collection Time: 07/27/24  8:03 AM  Result Value Ref Range   Creatinine, Urine 351 (H) 20 - 275 mg/dL   Microalb, Ur 2.2 mg/dL   Microalb Creat Ratio 6 <30 mg/g creat  CBC with Differential/Platelet   Collection Time: 07/27/24  8:03 AM  Result Value Ref Range   WBC 6.1 3.8 - 10.8 Thousand/uL   RBC 4.20 3.80 - 5.10 Million/uL   Hemoglobin 12.1 11.7 - 15.5 g/dL   HCT 62.5 64.9 - 54.9 %   MCV 89.0 80.0 - 100.0 fL   MCH 28.8 27.0 - 33.0 pg   MCHC 32.4 32.0 - 36.0 g/dL  RDW 13.5 11.0 - 15.0 %   Platelets 246 140 - 400 Thousand/uL   MPV 10.8 7.5 - 12.5 fL   Neutro Abs 2,806 1,500 - 7,800 cells/uL   Absolute Lymphocytes 2,611 850 - 3,900 cells/uL   Absolute Monocytes 500 200 - 950 cells/uL   Eosinophils Absolute 122 15 - 500 cells/uL   Basophils Absolute 61 0 - 200 cells/uL   Neutrophils Relative % 46 %   Total Lymphocyte 42.8 %   Monocytes Relative 8.2 %   Eosinophils Relative 2.0 %   Basophils Relative 1.0 %  Hemoglobin A1c   Collection Time: 07/27/24  8:03 AM  Result Value Ref Range   Hgb A1c MFr Bld 6.0 (H) <5.7 %   Mean Plasma Glucose 126 mg/dL   eAG (mmol/L) 7.0 mmol/L  Lipid panel   Collection Time: 07/27/24  8:03 AM  Result Value Ref Range   Cholesterol 135 <200 mg/dL   HDL 31 (L) > OR = 50 mg/dL   Triglycerides 853 <849 mg/dL   LDL Cholesterol (Calc) 79 mg/dL (calc)   Total CHOL/HDL Ratio 4.4 <5.0 (calc)   Non-HDL Cholesterol (Calc) 104 <130 mg/dL (calc)      Assessment & Plan:   Problem List Items Addressed This Visit     Essential hypertension   Relevant Medications   atorvastatin  (LIPITOR) 10 MG tablet   GAD (generalized anxiety disorder)   Relevant Medications   escitalopram  (LEXAPRO ) 20 MG tablet   Mixed hyperlipidemia   Relevant Medications   atorvastatin  (LIPITOR) 10 MG tablet   OSA on CPAP   Type 2 diabetes mellitus with other specified complication (HCC)   Relevant Medications   atorvastatin  (LIPITOR) 10 MG tablet   Other Visit Diagnoses        Annual physical exam    -  Primary     Flu vaccine need       Relevant Orders   Flu vaccine HIGH DOSE PF(Fluzone Trivalent) (Completed)     Gastroesophageal reflux disease without esophagitis       Relevant Medications   omeprazole  (PRILOSEC) 40 MG capsule     Long-term current use of injectable noninsulin antidiabetic medication            Updated Health Maintenance information Reviewed recent lab results with patient Encouraged improvement to lifestyle with diet and exercise Goal of weight loss   Adult Wellness Visit Routine wellness visit with normal labs. Weight loss of 15 pounds. General fatigue not linked to diabetes or sleep issues. No neuropathy. Declined CT scan for cardiac evaluation. - Scheduled nurse visit in January for Prevnar 20 vaccine. - Continue current medications and lifestyle modifications. - Follow-up in six months for routine check-up and A1c monitoring.  Type 2 diabetes mellitus Well-controlled with A1c of 6.0, best in four years. Effective weight loss and medication regimen. - Continue Trulicity  as prescribed. - Continue metformin  as prescribed.  Essential hypertension Blood pressure slightly elevated at 144/88, likely situational. Weight loss and lifestyle changes expected to improve it. - Monitor blood pressure and adjust treatment if necessary.  Mixed hyperlipidemia Well-managed with atorvastatin . LDL at 79, indicating good control. - Continue atorvastatin  as prescribed.  Immunization management Received flu shot today. Prevnar 20 vaccine scheduled for January. - Scheduled Prevnar 20 pneumonia vaccine for January.     OSA on CPAP Therapy controlled    Orders Placed This Encounter  Procedures   Flu vaccine HIGH DOSE PF(Fluzone Trivalent)    Meds ordered this encounter  Medications  atorvastatin  (LIPITOR) 10 MG tablet    Sig: Take 1 tablet (10 mg total) by mouth daily.    Dispense:  90 tablet    Refill:  3   escitalopram   (LEXAPRO ) 20 MG tablet    Sig: Take 1 tablet (20 mg total) by mouth daily.    Dispense:  90 tablet    Refill:  3    Add extra refills   omeprazole  (PRILOSEC) 40 MG capsule    Sig: Take 1 capsule (40 mg total) by mouth daily.    Dispense:  90 capsule    Refill:  3    Add refills for future     Follow up plan: Return in about 6 months (around 02/04/2025) for 6 month DM A1c.  Marsa Officer, DO [facility] 08/06/2024, 1:38 PM

## 2024-08-16 ENCOUNTER — Telehealth: Payer: Self-pay

## 2024-08-16 NOTE — Telephone Encounter (Signed)
 Pt called in and notified that Trulicity  is ready for pickup

## 2024-08-16 NOTE — Telephone Encounter (Signed)
 Left message for patient to return call OK  to advise  we have received Trulicity  in the office she can come and pick this up at her convenience

## 2024-08-17 NOTE — Progress Notes (Signed)
 Natasha Curry                                          MRN: [id number]   08/17/2024   The [facility]BCI Quality Team Specialist reviewed this patient medical record for the purposes of chart review for care gap closure. The following were reviewed: abstraction for care gap closure-kidney health evaluation for diabetes:eGFR  and uACR.    VBCI Quality Team

## 2024-08-20 ENCOUNTER — Other Ambulatory Visit: Admitting: Pharmacist

## 2024-08-20 DIAGNOSIS — Z7985 Long-term (current) use of injectable non-insulin antidiabetic drugs: Secondary | ICD-10-CM

## 2024-08-20 DIAGNOSIS — E1169 Type 2 diabetes mellitus with other specified complication: Secondary | ICD-10-CM

## 2024-08-20 NOTE — Progress Notes (Signed)
 08/20/2024 Name: Natasha Curry MRN: [id number] DOB: 1946-07-31  Chief Complaint  Patient presents with   Medication Management   Medication Assistance    Natasha Curry is a 78 y.o. year old female who presented for a telephone visit.   They were referred to the pharmacist by their PCP for assistance in managing diabetes and medication access.      Subjective:   Care Team: Primary Care Provider: Edman Marsa PARAS, DO ; Next Scheduled Visit: 02/11/2025 Pulmonologist: Kasa, Kurian, MD  Medication Access/Adherence  [facility]acy[facility]  [phone] [place] [place] - [address] [place] [address] Phone: [phone] Fax: [phone]  [facility] - [place], [place] - [address] [place] [address] Phone: [phone] Fax: [phone]   Patient reports affordability concerns with their medications: No  Patient reports access/transportation concerns to their pharmacy: No  Patient reports adherence concerns with their medications:  No     Reports uses weekly pillbox   Reports feeling chronically fatigued. States also discussed with PCP at Office Visit on 12/1  Confirms consistently using CPAP    Diabetes:   Current medications:  - metformin  500 mg twice daily - Trulicity  1.5 mg weekly on Fridays             Reports tolerating well   Medications tried in the past: Rybelsus  (stomach upset), Ozempic (GI upset), Mounjaro  (cost)   Confirms picked up new glucometer and testing supplies. Recent morning fasting blood sugar readings ranging mostly 86-89  Statin therapy: atorvastatin  10 mg daily   Current Physical Activity: reports limited to movement around the home, but plans to increase walking as recommended by PCP   Current Medication Access Support: enrolled in patient assistance for Trulicity  from [employer] through 09/05/2025 Husband plans to come by office today to pick up latest supply of  Trulicity  received from assistance program   Objective:  Lab Results  Component Value Date   HGBA1C 6.0 (H) 07/27/2024    Lab Results  Component Value Date   CREATININE 0.87 07/27/2024   BUN 14 07/27/2024   NA 142 07/27/2024   K 3.9 07/27/2024   CL 107 07/27/2024   CO2 25 07/27/2024    Lab Results  Component Value Date   CHOL 135 07/27/2024   HDL 31 (L) 07/27/2024   LDLCALC 79 07/27/2024   TRIG 146 07/27/2024   CHOLHDL 4.4 07/27/2024    Medications Reviewed Today     Reviewed by Alana Sharyle LABOR, RPH-CPP (Pharmacist) on 08/20/24 at 1623  Med List Status: <None>   Medication Order Taking? Sig Documenting Provider Last Dose Status Informant  atorvastatin  (LIPITOR) 10 MG tablet [id number]  Take 1 tablet (10 mg total) by mouth daily. Edman Marsa PARAS, DO  Active   Blood Glucose Monitoring Suppl (ONETOUCH VERIO FLEX SYSTEM) w/Device KIT [id number]  Use to check blood sugar up to twice daily as directed Edman Marsa PARAS, DO  Active   cyclobenzaprine  (FLEXERIL ) 10 MG tablet [id number]  TAKE 1 TABLET BY MOUTH AT BEDTIME AS NEEDED FOR MUSCLE SPASMS Karamalegos, Marsa PARAS, DO  Active   desoximetasone (TOPICORT) 0.25 % cream [id number]  Apply 1 Application topically 2 (two) times daily. [provider]  Active   Dulaglutide  (TRULICITY ) 1.5 MG/0.5ML EMMANUEL [id number] Yes Inject 1.5 mg into the skin once a week. Edman Marsa PARAS, DO  Active   escitalopram  (LEXAPRO ) 20 MG tablet [id number]  Take 1 tablet (  20 mg total) by mouth daily. Edman Marsa PARAS, DO  Active   glucose blood (ONETOUCH VERIO) test strip [id number]  Use to check blood sugar twice daily Edman Marsa PARAS, DO  Active   Lancets [facility] CATHRYNE PLUS [place]) MISC [id number]  Use to check blood sugar twice daily Edman Marsa PARAS, DO  Active   metFORMIN  (GLUCOPHAGE ) 500 MG tablet [id number] Yes TAKE 1 TABLET BY MOUTH 2 TIMES DAILY WITH A MEAL. Edman Marsa PARAS, DO   Active   metoprolol  succinate (TOPROL -XL) 25 MG 24 hr tablet [id number]  TAKE 1/2 TABLET BY MOUTH EVERY DAY Karamalegos, Alexander PARAS, DO  Active   naproxen  (NAPROSYN ) 500 MG tablet [id number]  Take 1 tablet (500 mg total) by mouth 2 (two) times daily with a meal. For 1-2 weeks then as needed Edman Marsa PARAS, DO  Active   nystatin cream (MYCOSTATIN) [id number]  APPLY A THIN LAYER TO AFFECTED AREA TWICE DAILY TO FOLDS [provider]  Active   omeprazole  (PRILOSEC) 40 MG capsule [id number]  Take 1 capsule (40 mg total) by mouth daily. Edman Marsa PARAS, DO  Active   triamcinolone  cream (KENALOG ) 0.5 % [id number]  Apply 1 Application topically 2 (two) times daily. To affected areas, for up to 2 weeks. Edman Marsa PARAS, DO  Active               Assessment/Plan:   Have encouraged patient to continue to use weekly pillbox. Encouraged to use weekly phone alarm to aid with adherence to Trulicity    Husband will pick up Trulicity  from office today  Will collaborate with PCP regarding patient's report of chronic fatigue/possible lab work for Vitamin B12 (due to long term metformin  use) and Vitamin D levels    Diabetes: - Currently uncontrolled - Reviewed long term cardiovascular and renal outcomes of uncontrolled blood sugar - Reviewed goal A1c, goal fasting, and goal 2 hour post prandial glucose - Have reviewed dietary modifications including importance of having regular well-balanced meals and snacks throughout the day, while controlling carbohydrate portion sizes - Patient to follow up with [employer] patient assistance program as needed for refills of Trulicity  - Encourage to gradually increase walking as recommended by PCP - Recommend to check glucose, keep log of results and have this record to review at upcoming medical appointments. Patient to contact provider office sooner if needed for readings outside of established parameters or symptoms    Follow Up Plan:  Clinical Pharmacist will follow up with patient by telephone on 11/19/2024 at 3:00 PM    Sharyle Sia, PharmD, [name], CPP Clinical Pharmacist [facility] [phone]

## 2024-08-20 NOTE — Patient Instructions (Signed)
 Goals Addressed             This Visit's Progress    Pharmacy Goals       The goal A1c is less than 7%. This is the best way to reduce the risk of the long term complications of diabetes, including heart disease, kidney disease, eye disease, strokes, and nerve damage. An A1c of less than 7% corresponds with fasting sugars less than 130 and 2 hour after meal sugars less than 180.   Our goal bad cholesterol, or LDL, is less than 70 . This is why it is important to continue taking your atorvastatin .  Arthur Lash, PharmD, Becky Bowels, CPP Clinical Pharmacist [facility] [phone]

## 2024-08-21 DIAGNOSIS — G4733 Obstructive sleep apnea (adult) (pediatric): Secondary | ICD-10-CM | POA: Diagnosis not present

## 2024-08-22 ENCOUNTER — Other Ambulatory Visit: Payer: Self-pay | Admitting: Family Medicine

## 2024-08-22 ENCOUNTER — Telehealth: Payer: Self-pay | Admitting: Family Medicine

## 2024-08-22 DIAGNOSIS — E559 Vitamin D deficiency, unspecified: Secondary | ICD-10-CM

## 2024-08-22 DIAGNOSIS — E538 Deficiency of other specified B group vitamins: Secondary | ICD-10-CM

## 2024-08-22 NOTE — Telephone Encounter (Signed)
 Could you call patient to notify her that I have scheduled her for a non fasting lab draw on 09/18/24 at 1:45pm, she can arrive for the Prevnar vaccine at 1:30 for her nurse visit and then blood work after the vaccine. The lab work is to check Vitamin B12 and Vitamin D levels as she discussed with Sharyle.  Thank you!  Marsa Officer, DO [facility] 08/22/2024, 4:54 PM

## 2024-08-23 DIAGNOSIS — G4733 Obstructive sleep apnea (adult) (pediatric): Secondary | ICD-10-CM | POA: Diagnosis not present

## 2024-08-23 NOTE — Telephone Encounter (Signed)
Pt called back and verbalized understanding.

## 2024-08-23 NOTE — Telephone Encounter (Signed)
 Left message for patient to return call OK to advise  of appointments

## 2024-09-18 ENCOUNTER — Other Ambulatory Visit

## 2024-09-18 ENCOUNTER — Ambulatory Visit (INDEPENDENT_AMBULATORY_CARE_PROVIDER_SITE_OTHER)

## 2024-09-18 DIAGNOSIS — E559 Vitamin D deficiency, unspecified: Secondary | ICD-10-CM

## 2024-09-18 DIAGNOSIS — Z23 Encounter for immunization: Secondary | ICD-10-CM | POA: Diagnosis not present

## 2024-09-18 DIAGNOSIS — E538 Deficiency of other specified B group vitamins: Secondary | ICD-10-CM

## 2024-09-18 LAB — VITAMIN B12: Vitamin B-12: 175 pg/mL — ABNORMAL LOW (ref 200–1100)

## 2024-09-18 LAB — VITAMIN D 25 HYDROXY (VIT D DEFICIENCY, FRACTURES): Vit D, 25-Hydroxy: 34 ng/mL (ref 30–100)

## 2024-09-24 ENCOUNTER — Ambulatory Visit: Payer: Self-pay | Admitting: Family Medicine

## 2024-09-27 ENCOUNTER — Ambulatory Visit

## 2024-09-27 ENCOUNTER — Telehealth: Payer: Self-pay

## 2024-09-27 DIAGNOSIS — E538 Deficiency of other specified B group vitamins: Secondary | ICD-10-CM

## 2024-09-27 MED ORDER — CYANOCOBALAMIN 1000 MCG/ML IJ SOLN: 1000.0000 ug | INTRAMUSCULAR | 0 refills | Status: AC

## 2024-09-27 MED ORDER — CYANOCOBALAMIN 1000 MCG/ML IJ SOLN
1000.0000 ug | Freq: Once | INTRAMUSCULAR | Status: AC
  Administered 2024-09-27: 1000 ug via INTRAMUSCULAR

## 2024-09-27 NOTE — Telephone Encounter (Signed)
 Yes she has agreed and scheduled

## 2024-09-27 NOTE — Telephone Encounter (Signed)
 Yes, thank you! [facility] for the [facility] weeks.  Thanks for notifying me she has been scheduled and agreed to the plan, sometimes I wait to confirm the patient agrees before I order the vials.  Marsa Officer, DO [facility] 09/27/2024, 2:03 PM

## 2024-09-28 ENCOUNTER — Ambulatory Visit

## 2024-10-02 ENCOUNTER — Ambulatory Visit

## 2024-10-04 ENCOUNTER — Ambulatory Visit

## 2024-10-04 DIAGNOSIS — E538 Deficiency of other specified B group vitamins: Secondary | ICD-10-CM

## 2024-10-04 MED ORDER — CYANOCOBALAMIN 1000 MCG/ML IJ SOLN
1000.0000 ug | Freq: Once | INTRAMUSCULAR | Status: AC
  Administered 2024-10-04: 1000 ug via INTRAMUSCULAR

## 2024-10-09 ENCOUNTER — Ambulatory Visit

## 2024-10-11 ENCOUNTER — Ambulatory Visit

## 2024-10-11 DIAGNOSIS — E538 Deficiency of other specified B group vitamins: Secondary | ICD-10-CM

## 2024-10-11 MED ORDER — CYANOCOBALAMIN 1000 MCG/ML IJ SOLN
1000.0000 ug | Freq: Once | INTRAMUSCULAR | Status: AC
  Administered 2024-10-11: 1000 ug via INTRAMUSCULAR

## 2024-10-16 ENCOUNTER — Ambulatory Visit

## 2024-10-18 ENCOUNTER — Ambulatory Visit

## 2024-10-23 ENCOUNTER — Ambulatory Visit

## 2024-10-25 ENCOUNTER — Ambulatory Visit

## 2024-11-19 ENCOUNTER — Other Ambulatory Visit

## 2024-11-20 ENCOUNTER — Ambulatory Visit

## 2024-11-22 ENCOUNTER — Ambulatory Visit

## 2024-12-25 ENCOUNTER — Ambulatory Visit

## 2024-12-27 ENCOUNTER — Ambulatory Visit

## 2025-02-11 ENCOUNTER — Ambulatory Visit: Admitting: Family Medicine

## 2025-04-12 ENCOUNTER — Ambulatory Visit

## 2025-04-24 ENCOUNTER — Ambulatory Visit
